# Patient Record
Sex: Male | Born: 1940 | Race: White | Hispanic: No | Marital: Married | Smoking: Former smoker
Health system: Southern US, Community
[De-identification: ages and names within clinical notes are randomized; demographics above are authoritative.]

## PROBLEM LIST (undated history)

## (undated) DIAGNOSIS — M545 Low back pain, unspecified: Secondary | ICD-10-CM

## (undated) DIAGNOSIS — I219 Acute myocardial infarction, unspecified: Secondary | ICD-10-CM

## (undated) DIAGNOSIS — M1990 Unspecified osteoarthritis, unspecified site: Secondary | ICD-10-CM

## (undated) DIAGNOSIS — H409 Unspecified glaucoma: Secondary | ICD-10-CM

## (undated) DIAGNOSIS — E785 Hyperlipidemia, unspecified: Secondary | ICD-10-CM

## (undated) DIAGNOSIS — G4730 Sleep apnea, unspecified: Secondary | ICD-10-CM

## (undated) DIAGNOSIS — I10 Essential (primary) hypertension: Secondary | ICD-10-CM

## (undated) DIAGNOSIS — H9319 Tinnitus, unspecified ear: Secondary | ICD-10-CM

## (undated) DIAGNOSIS — E119 Type 2 diabetes mellitus without complications: Secondary | ICD-10-CM

## (undated) HISTORY — DX: Acute myocardial infarction, unspecified: I21.9

## (undated) HISTORY — DX: Type 2 diabetes mellitus without complications: E11.9

## (undated) HISTORY — DX: Hyperlipidemia, unspecified: E78.5

## (undated) HISTORY — DX: Sleep apnea, unspecified: G47.30

## (undated) HISTORY — PX: CORONARY ANGIOPLASTY WITH STENT PLACEMENT: SHX49

## (undated) HISTORY — DX: Unspecified osteoarthritis, unspecified site: M19.90

## (undated) HISTORY — DX: Essential (primary) hypertension: I10

## (undated) HISTORY — DX: Unspecified glaucoma: H40.9

---

## 2004-04-12 ENCOUNTER — Emergency Department: Payer: Self-pay | Admitting: Internal Medicine

## 2004-07-26 ENCOUNTER — Other Ambulatory Visit: Payer: Self-pay

## 2004-08-01 ENCOUNTER — Ambulatory Visit: Payer: Self-pay | Admitting: General Practice

## 2008-01-23 ENCOUNTER — Ambulatory Visit: Payer: Self-pay | Admitting: Internal Medicine

## 2011-04-19 ENCOUNTER — Encounter: Payer: Self-pay | Admitting: Family Medicine

## 2011-04-20 ENCOUNTER — Encounter: Payer: Self-pay | Admitting: Family Medicine

## 2011-05-21 ENCOUNTER — Encounter: Payer: Self-pay | Admitting: Family Medicine

## 2011-11-01 ENCOUNTER — Ambulatory Visit: Payer: Self-pay | Admitting: Family Medicine

## 2013-11-03 DIAGNOSIS — I2510 Atherosclerotic heart disease of native coronary artery without angina pectoris: Secondary | ICD-10-CM | POA: Insufficient documentation

## 2013-11-03 DIAGNOSIS — I10 Essential (primary) hypertension: Secondary | ICD-10-CM | POA: Insufficient documentation

## 2013-11-03 DIAGNOSIS — E782 Mixed hyperlipidemia: Secondary | ICD-10-CM | POA: Insufficient documentation

## 2014-05-11 DIAGNOSIS — I340 Nonrheumatic mitral (valve) insufficiency: Secondary | ICD-10-CM | POA: Insufficient documentation

## 2014-05-11 DIAGNOSIS — I6523 Occlusion and stenosis of bilateral carotid arteries: Secondary | ICD-10-CM | POA: Insufficient documentation

## 2014-09-21 ENCOUNTER — Encounter: Payer: Self-pay | Admitting: Urology

## 2014-09-21 ENCOUNTER — Ambulatory Visit (INDEPENDENT_AMBULATORY_CARE_PROVIDER_SITE_OTHER): Payer: Medicare Other | Admitting: Urology

## 2014-09-21 ENCOUNTER — Telehealth: Payer: Self-pay | Admitting: Urology

## 2014-09-21 VITALS — BP 122/64 | HR 82 | Ht 71.0 in | Wt 193.7 lb

## 2014-09-21 DIAGNOSIS — N3001 Acute cystitis with hematuria: Secondary | ICD-10-CM

## 2014-09-21 DIAGNOSIS — N401 Enlarged prostate with lower urinary tract symptoms: Secondary | ICD-10-CM

## 2014-09-21 DIAGNOSIS — N138 Other obstructive and reflux uropathy: Secondary | ICD-10-CM

## 2014-09-21 LAB — URINALYSIS, COMPLETE
BILIRUBIN UA: NEGATIVE
GLUCOSE, UA: NEGATIVE
Ketones, UA: NEGATIVE
LEUKOCYTES UA: NEGATIVE
Nitrite, UA: NEGATIVE
SPEC GRAV UA: 1.025 (ref 1.005–1.030)
Urobilinogen, Ur: 0.2 mg/dL (ref 0.2–1.0)
pH, UA: 6 (ref 5.0–7.5)

## 2014-09-21 LAB — MICROSCOPIC EXAMINATION: Bacteria, UA: NONE SEEN

## 2014-09-21 LAB — BLADDER SCAN AMB NON-IMAGING

## 2014-09-21 NOTE — Telephone Encounter (Signed)
This is a patient of Dr. Darreld Mclean.  Would you call Adrian Kim and get his recent PSA results and microscopic hematuria?

## 2014-09-21 NOTE — Progress Notes (Signed)
09/21/2014 2:10 PM   Adrian Kim 08/24/1940 [id number]  Referring provider: No referring provider defined for this encounter.  Chief Complaint  Patient presents with  . Cystitis    Acute, with Hematuria  Referred by Phineas Real    HPI: Adrian Kim is a 74 year old [place] man who is referred to [place] for acute cystitis with hematuria.  He presents with his son who is interpreting for him.  He is a poor historian.  The history from the son is he is getting up 4 times a night since his infection.  He is also experiencing day time frequency.  He has not had gross hematuria.  He was given a medication that helps his symptoms somewhat, but the son is unsure of the name of the medication.  He is not having dysuria, suprapubic pain, flank pain or feelings of incomplete emptying.    He denies fevers, chills, nausea or vomiting.    I do have the notes from his PCP's office, but they are incomplete.  He did have a +dip for micro heme and the urine was sent for microanalysis.  They also drew a PSA.     PMH: Past Medical History  Diagnosis Date  . Arthritis   . Diabetes   . Glaucoma   . Heart attack   . HLD (hyperlipidemia)   . HTN (hypertension)   . Sleep apnea     Surgical History: Past Surgical History  Procedure Laterality Date  . Coronary angioplasty with stent placement      Home Medications:    Medication List       This list is accurate as of: 09/21/14  2:10 PM.  Always use your most recent med list.               aspirin EC 81 MG tablet  Take by mouth.     benzonatate 100 MG capsule  Commonly known as:  TESSALON  Take by mouth.     BETIMOL OP  Apply to eye.     ciprofloxacin 500 MG tablet  Commonly known as:  CIPRO     clopidogrel 75 MG tablet  Commonly known as:  PLAVIX  Take by mouth.     CRESTOR 40 MG tablet  Generic drug:  rosuvastatin     fluticasone 50 MCG/ACT nasal spray  Commonly known as:  FLONASE     LANTUS 100 UNIT/ML injection    Generic drug:  insulin glargine  Inject into the skin.     latanoprost 0.005 % ophthalmic solution  Commonly known as:  XALATAN     linagliptin 5 MG Tabs tablet  Commonly known as:  TRADJENTA  Take 5 mg by mouth daily.     lisinopril 2.5 MG tablet  Commonly known as:  PRINIVIL,ZESTRIL     meloxicam 15 MG tablet  Commonly known as:  MOBIC  Take by mouth.     metFORMIN 500 MG 24 hr tablet  Commonly known as:  GLUCOPHAGE-XR     pioglitazone 15 MG tablet  Commonly known as:  ACTOS  Take by mouth.     timolol 0.5 % ophthalmic solution  Commonly known as:  TIMOPTIC     VOLTAREN 1 % Gel  Generic drug:  diclofenac sodium     zolpidem 10 MG tablet  Commonly known as:  AMBIEN        Allergies: No Known Allergies  Family History: Family History  Problem Relation Age of Onset  . Kidney  disease Neg Hx   . Prostate cancer Neg Hx   . Leukemia Son     Social History:  reports that he has quit smoking. He does not have any smokeless tobacco history on file. He reports that he does not drink alcohol or use illicit drugs.  ROS: UROLOGY Frequent Urination?: No Hard to postpone urination?: No Burning/pain with urination?: No Get up at night to urinate?: Yes Leakage of urine?: No Urine stream starts and stops?: No Trouble starting stream?: No Do you have to strain to urinate?: No Blood in urine?: No Urinary tract infection?: No Sexually transmitted disease?: No Injury to kidneys or bladder?: No Painful intercourse?: No Weak stream?: No Erection problems?: No Penile pain?: No  Gastrointestinal Nausea?: No Vomiting?: No Indigestion/heartburn?: No Diarrhea?: No Constipation?: No  Constitutional Fever: No Night sweats?: No Weight loss?: No Fatigue?: No  Skin Skin rash/lesions?: No Itching?: No  Eyes Blurred vision?: No Double vision?: No  Ears/Nose/Throat Sore throat?: No Sinus problems?: No  Hematologic/Lymphatic Swollen glands?: No Easy  bruising?: No  Cardiovascular Leg swelling?: No Chest pain?: No  Respiratory Cough?: No Shortness of breath?: No  Endocrine Excessive thirst?: No  Musculoskeletal Back pain?: Yes Joint pain?: Yes  Neurological Headaches?: No Dizziness?: No  Psychologic Depression?: No Anxiety?: No  Physical Exam: BP 122/64 mmHg  Pulse 82  Ht 5\' 11"  (1.803 m)  Wt 193 lb 11.2 oz (87.862 kg)  BMI 27.03 kg/m2  Constitutional:  Alert and oriented, No acute distress. HEENT: [place] AT, moist mucus membranes.  Trachea midline, no masses. Cardiovascular: No clubbing, cyanosis, or edema. Respiratory: Normal respiratory effort, no increased work of breathing. GI: Abdomen is soft, nontender, nondistended, no abdominal masses GU: No CVA tenderness. GU: Patient with uncircumcised phallus. Foreskin easily retracted  Urethral meatus is patent.  No penile discharge. No penile lesions or rashes. Scrotum without lesions, cysts, rashes and/or edema.  Testicles are located scrotally bilaterally. No masses are appreciated in the testicles. Left and right epididymis are normal. Rectal: Patient with  normal sphincter tone. Perineum without scarring or rashes. No rectal masses are appreciated. Prostate is approximately 45 grams, no nodules are appreciated. Seminal vesicles are normal. Skin: No rashes, bruises or suspicious lesions. Lymph: No cervical or inguinal adenopathy. Neurologic: Grossly intact, no focal deficits, moving all 4 extremities. Psychiatric: Normal mood and affect.  Laboratory Data: Results for orders placed or performed in visit on 09/21/14  Microscopic Examination  Result Value Ref Range   WBC, UA 0-5 0 -  5 /hpf   RBC, UA 3-10 (A) 0 -  2 /hpf   Epithelial Cells (non renal) 0-10 0 - 10 /hpf   Mucus, UA Present (A) Not Estab.   Bacteria, UA None seen None seen/Few  Urinalysis, Complete  Result Value Ref Range   Specific Gravity, UA 1.025 1.005 - 1.030   pH, UA 6.0 5.0 - 7.5   Color, UA  Yellow Yellow   Appearance Ur Clear Clear   Leukocytes, UA Negative Negative   Protein, UA Trace (A) Negative/Trace   Glucose, UA Negative Negative   Ketones, UA Negative Negative   RBC, UA 1+ (A) Negative   Bilirubin, UA Negative Negative   Urobilinogen, Ur 0.2 0.2 - 1.0 mg/dL   Nitrite, UA Negative Negative   Microscopic Examination See below:   BLADDER SCAN AMB NON-IMAGING  Result Value Ref Range   Scan Result 73ml    No results found for: WBC, HGB, HCT, MCV, PLT  No results found  for: CREATININE  No results found for: PSA  No results found for: TESTOSTERONE  No results found for: HGBA1C  Urinalysis No results found for: COLORURINE, APPEARANCEUR, LABSPEC, PHURINE, GLUCOSEU, HGBUR, BILIRUBINUR, KETONESUR, PROTEINUR, UROBILINOGEN, NITRITE, LEUKOCYTESUR  Pertinent Imaging:   Assessment & Plan:    1. Acute cystitis with hematuria:   Patient has microscopic hematuria on today's urinalysis. I will send this urine for culture. I do not have the microscopic urinalysis from his primary care provider at this time. The primary care provider's note states he had a positive dip for hematuria and that the urine was sent for microanalysis.  If patient's urine culture returns negative and a microscopic analysis of the urine was performed at the primary care's office and it did demonstrate 3 or greater RBCs per high-power field, we will pursue a hematuria workup.  If urine culture returns positive, we will treat with the appropriate antibiotic and recheck the urine 3-5 days after he completes the antibiotic.  If urine culture returns negative and the primary care's office did not complete a microscopic analysis of the urine or bear microscopic analysis of the urine did not demonstrate micro-heme, I will ask the patient to return for a follow-up UA to monitor for microscopic hematuria.  - Urinalysis, Complete  2.  BPH with LUTS:   It was difficult to ascertain during the patient's history  whether he has been having lower urinary tract symptoms for a long period of time or just recently with the urinary tract infection. The primary care provider did obtain a PSA. I will request those results. We will continue to monitor patient's lower urinary tract symptoms after we have confirmed the urinary tract infection has cleared.   No Follow-up on file.  Michiel Cowboy, PA-C  [facility] [address], [address] [place], [place] [address] [phone]

## 2014-09-22 DIAGNOSIS — N401 Enlarged prostate with lower urinary tract symptoms: Secondary | ICD-10-CM

## 2014-09-22 DIAGNOSIS — N3001 Acute cystitis with hematuria: Secondary | ICD-10-CM | POA: Insufficient documentation

## 2014-09-22 DIAGNOSIS — N138 Other obstructive and reflux uropathy: Secondary | ICD-10-CM | POA: Insufficient documentation

## 2014-09-22 NOTE — Telephone Encounter (Signed)
Spoke with [facility] who will be faxing over records.

## 2014-09-23 LAB — CULTURE, URINE COMPREHENSIVE

## 2014-11-17 DIAGNOSIS — I071 Rheumatic tricuspid insufficiency: Secondary | ICD-10-CM | POA: Insufficient documentation

## 2014-12-09 ENCOUNTER — Encounter: Payer: Self-pay | Admitting: *Deleted

## 2014-12-10 ENCOUNTER — Encounter: Payer: Self-pay | Admitting: *Deleted

## 2014-12-10 ENCOUNTER — Ambulatory Visit: Payer: Medicare Other | Admitting: Anesthesiology

## 2014-12-10 ENCOUNTER — Ambulatory Visit
Admission: RE | Admit: 2014-12-10 | Discharge: 2014-12-10 | Disposition: A | Discharge status: Home / Self Care | Payer: Medicare Other | Source: Ambulatory Visit | Attending: Gastroenterology | Admitting: Gastroenterology

## 2014-12-10 ENCOUNTER — Encounter
Admission: RE | Disposition: A | Discharge status: Home / Self Care | Payer: Self-pay | Source: Ambulatory Visit | Attending: Gastroenterology

## 2014-12-10 DIAGNOSIS — H409 Unspecified glaucoma: Secondary | ICD-10-CM | POA: Diagnosis not present

## 2014-12-10 DIAGNOSIS — E785 Hyperlipidemia, unspecified: Secondary | ICD-10-CM | POA: Diagnosis not present

## 2014-12-10 DIAGNOSIS — I10 Essential (primary) hypertension: Secondary | ICD-10-CM | POA: Insufficient documentation

## 2014-12-10 DIAGNOSIS — I252 Old myocardial infarction: Secondary | ICD-10-CM | POA: Insufficient documentation

## 2014-12-10 DIAGNOSIS — Z806 Family history of leukemia: Secondary | ICD-10-CM | POA: Diagnosis not present

## 2014-12-10 DIAGNOSIS — Z7982 Long term (current) use of aspirin: Secondary | ICD-10-CM | POA: Insufficient documentation

## 2014-12-10 DIAGNOSIS — G4730 Sleep apnea, unspecified: Secondary | ICD-10-CM | POA: Insufficient documentation

## 2014-12-10 DIAGNOSIS — Z7984 Long term (current) use of oral hypoglycemic drugs: Secondary | ICD-10-CM | POA: Diagnosis not present

## 2014-12-10 DIAGNOSIS — M1990 Unspecified osteoarthritis, unspecified site: Secondary | ICD-10-CM | POA: Insufficient documentation

## 2014-12-10 DIAGNOSIS — Z79899 Other long term (current) drug therapy: Secondary | ICD-10-CM | POA: Diagnosis not present

## 2014-12-10 DIAGNOSIS — Z87891 Personal history of nicotine dependence: Secondary | ICD-10-CM | POA: Insufficient documentation

## 2014-12-10 DIAGNOSIS — I2510 Atherosclerotic heart disease of native coronary artery without angina pectoris: Secondary | ICD-10-CM | POA: Diagnosis not present

## 2014-12-10 DIAGNOSIS — Z1211 Encounter for screening for malignant neoplasm of colon: Secondary | ICD-10-CM | POA: Insufficient documentation

## 2014-12-10 DIAGNOSIS — E119 Type 2 diabetes mellitus without complications: Secondary | ICD-10-CM | POA: Diagnosis not present

## 2014-12-10 DIAGNOSIS — Z794 Long term (current) use of insulin: Secondary | ICD-10-CM | POA: Diagnosis not present

## 2014-12-10 DIAGNOSIS — Z955 Presence of coronary angioplasty implant and graft: Secondary | ICD-10-CM | POA: Diagnosis not present

## 2014-12-10 HISTORY — PX: COLONOSCOPY WITH PROPOFOL: SHX5780

## 2014-12-10 LAB — GLUCOSE, CAPILLARY: GLUCOSE-CAPILLARY: 144 mg/dL — AB (ref 65–99)

## 2014-12-10 SURGERY — COLONOSCOPY WITH PROPOFOL
Anesthesia: General

## 2014-12-10 MED ORDER — SODIUM CHLORIDE 0.9 % IV SOLN
INTRAVENOUS | Status: DC
  Administered 2014-12-10: 1000 mL via INTRAVENOUS

## 2014-12-10 MED ORDER — PROPOFOL 500 MG/50ML IV EMUL
INTRAVENOUS | Status: DC | PRN
  Administered 2014-12-10: 120 ug/kg/min via INTRAVENOUS

## 2014-12-10 MED ORDER — SODIUM CHLORIDE 0.9 % IV SOLN: INTRAVENOUS | Status: DC

## 2014-12-10 MED ORDER — PROPOFOL 10 MG/ML IV BOLUS
INTRAVENOUS | Status: DC | PRN
  Administered 2014-12-10: 70 mg via INTRAVENOUS
  Administered 2014-12-10: 30 mg via INTRAVENOUS

## 2014-12-10 MED ORDER — LIDOCAINE HCL (CARDIAC) 20 MG/ML IV SOLN
INTRAVENOUS | Status: DC | PRN
  Administered 2014-12-10: 50 mg via INTRAVENOUS

## 2014-12-10 NOTE — H&P (Signed)
Primary Care Physician:  Leanna Sato, MD Primary Gastroenterologist:  Dr. Bluford Kaufmann  Pre-Procedure History & Physical: HPI:  Adrian Kim is a 74 y.o. male is here for an colonoscopy.  Past Medical History  Diagnosis Date  . Arthritis   . Diabetes (HCC)   . Glaucoma   . Heart attack (HCC)   . HLD (hyperlipidemia)   . HTN (hypertension)   . Sleep apnea     Past Surgical History  Procedure Laterality Date  . Coronary angioplasty with stent placement      Prior to Admission medications   Medication Sig Start Date End Date Taking? Authorizing Provider  aspirin EC 81 MG tablet Take by mouth.    Historical Provider, MD  benzonatate (TESSALON) 100 MG capsule Take by mouth.    Historical Provider, MD  ciprofloxacin (CIPRO) 500 MG tablet  08/31/14   Historical Provider, MD  clopidogrel (PLAVIX) 75 MG tablet Take by mouth.    Historical Provider, MD  CRESTOR 40 MG tablet  08/02/14   Historical Provider, MD  fluticasone Aleda Grana) 50 MCG/ACT nasal spray  09/20/14   Historical Provider, MD  insulin glargine (LANTUS) 100 UNIT/ML injection Inject into the skin.    Historical Provider, MD  latanoprost (XALATAN) 0.005 % ophthalmic solution  09/03/14   Historical Provider, MD  linagliptin (TRADJENTA) 5 MG TABS tablet Take 5 mg by mouth daily.    Historical Provider, MD  lisinopril (PRINIVIL,ZESTRIL) 2.5 MG tablet  06/21/14   Historical Provider, MD  meloxicam (MOBIC) 15 MG tablet Take by mouth.    Historical Provider, MD  metFORMIN (GLUCOPHAGE-XR) 500 MG 24 hr tablet  09/08/14   Historical Provider, MD  pioglitazone (ACTOS) 15 MG tablet Take by mouth.    Historical Provider, MD  timolol (TIMOPTIC) 0.5 % ophthalmic solution  09/06/14   Historical Provider, MD  Timolol Hemihydrate (BETIMOL OP) Apply to eye.    Historical Provider, MD  VOLTAREN 1 % GEL  09/14/14   Historical Provider, MD  zolpidem (AMBIEN) 10 MG tablet  06/22/14   Historical Provider, MD    Allergies as of 10/27/2014  . (No Known  Allergies)    Family History  Problem Relation Age of Onset  . Kidney disease Neg Hx   . Prostate cancer Neg Hx   . Leukemia Son     Social History   Social History  . Marital Status: Married    Spouse Name: N/A  . Number of Children: N/A  . Years of Education: N/A   Occupational History  . Not on file.   Social History Main Topics  . Smoking status: Former Games developer  . Smokeless tobacco: Never Used     Comment: Quit 11 years ago  . Alcohol Use: No  . Drug Use: No  . Sexual Activity: Not on file   Other Topics Concern  . Not on file   Social History Narrative    Review of Systems: See HPI, otherwise negative ROS  Physical Exam: There were no vitals taken for this visit. General:   Alert,  pleasant and cooperative in NAD Head:  Normocephalic and atraumatic. Neck:  Supple; no masses or thyromegaly. Lungs:  Clear throughout to auscultation.    Heart:  Regular rate and rhythm. Abdomen:  Soft, nontender and nondistended. Normal bowel sounds, without guarding, and without rebound.   Neurologic:  Alert and  oriented x4;  grossly normal neurologically.  Impression/Plan: Adrian Kim is here for an colonoscopy to be performed for screening  Risks, benefits, limitations, and alternatives regarding  colonoscopy have been reviewed with the patient.  Questions have been answered.  All parties agreeable.   Zavia Pullen, Ezzard Standing, MD  12/10/2014, 7:55 AM

## 2014-12-10 NOTE — Anesthesia Preprocedure Evaluation (Signed)
Anesthesia Evaluation  Patient identified by MRN, date of birth, ID band Patient awake    Reviewed: Allergy & Precautions, H&P , NPO status , Patient's Chart, lab work & pertinent test results, reviewed documented beta blocker date and time   History of Anesthesia Complications Negative for: history of anesthetic complications  Airway Mallampati: III  TM Distance: >3 FB Neck ROM: full    Dental no notable dental hx. (+) Upper Dentures, Lower Dentures   Pulmonary neg shortness of breath, sleep apnea , neg COPD, neg recent URI, former smoker,    Pulmonary exam normal breath sounds clear to auscultation       Cardiovascular Exercise Tolerance: Good hypertension, On Medications (-) angina+ CAD, + Past MI and + Cardiac Stents  (-) CABG Normal cardiovascular exam(-) dysrhythmias  Rhythm:regular Rate:Normal     Neuro/Psych negative neurological ROS  negative psych ROS   GI/Hepatic negative GI ROS, Neg liver ROS,   Endo/Other  diabetes  Renal/GU negative Renal ROS  negative genitourinary   Musculoskeletal   Abdominal   Peds  Hematology negative hematology ROS (+)   Anesthesia Other Findings Past Medical History:   Arthritis                                                    Diabetes (HCC)                                               Glaucoma                                                     Heart attack (HCC)                                           HLD (hyperlipidemia)                                         HTN (hypertension)                                           Sleep apnea                                                  Reproductive/Obstetrics negative OB ROS                             Anesthesia Physical Anesthesia Plan  ASA: III  Anesthesia Plan: General   Post-op Pain Management:    Induction:   Airway Management Planned:   Additional Equipment:   Intra-op Plan:  Post-operative Plan:   Informed Consent: I have reviewed the patients History and Physical, chart, labs and discussed the procedure including the risks, benefits and alternatives for the proposed anesthesia with the patient or authorized representative who has indicated his/her understanding and acceptance.   Dental Advisory Given  Plan Discussed with: Anesthesiologist, CRNA and Surgeon  Anesthesia Plan Comments:         Anesthesia Quick Evaluation

## 2014-12-10 NOTE — Transfer of Care (Signed)
Immediate Anesthesia Transfer of Care Note  Patient: Adrian Kim  Procedure(s) Performed: Procedure(s): COLONOSCOPY WITH PROPOFOL (N/A)  Patient Location: Endoscopy Unit  Anesthesia Type:General  Level of Consciousness: awake  Airway & Oxygen Therapy: Patient Spontanous Breathing and Patient connected to nasal cannula oxygen  Post-op Assessment: Report given to RN  Post vital signs: Reviewed  Last Vitals:  Filed Vitals:   12/10/14 0942  BP: 97/61  Pulse: 75  Temp: 36.1 C  Resp: 14    Complications: No apparent anesthesia complications

## 2014-12-10 NOTE — Op Note (Signed)
[facility] Patient Name: Adrian Kim Procedure Date: 12/10/2014 9:06 AM MRN: [id number] Account #: [id number] Date of Birth: 05-Aug-1940 Admit Type: Outpatient Age: 74 Room: [facility] ENDO ROOM 4 Gender: Male Note Status: Finalized Procedure:         Colonoscopy Indications:       Screening for colorectal malignant neoplasm Providers:         Ezzard Standing. Bluford Kaufmann, MD Referring MD:      Leanna Sato, MD (Referring MD) Medicines:         Monitored Anesthesia Care Complications:     No immediate complications. Procedure:         Pre-Anesthesia Assessment:                    - Prior to the procedure, a History and Physical was                     performed, and patient medications, allergies and                     sensitivities were reviewed. The patient's tolerance of                     previous anesthesia was reviewed.                    - The risks and benefits of the procedure and the sedation                     options and risks were discussed with the patient. All                     questions were answered and informed consent was obtained.                    - After reviewing the risks and benefits, the patient was                     deemed in satisfactory condition to undergo the procedure.                    After obtaining informed consent, the colonoscope was                     passed under direct vision. Throughout the procedure, the                     patient's blood pressure, pulse, and oxygen saturations                     were monitored continuously. The Colonoscope was                     introduced through the anus and advanced to the the cecum,                     identified by appendiceal orifice and ileocecal valve. The                     colonoscopy was performed without difficulty. The patient                     tolerated the procedure well. The quality of the bowel  preparation was poor. Findings:  The colon (entire examined portion) appeared normal. Prep was quite       poor. Lot of time spent lavaging and suctioning thick liquid stool. As a       result, there were areas that could not be visualized well. Impression:        - Preparation of the colon was poor.                    - The entire examined colon is normal.                    - No specimens collected. Recommendation:    - Discharge patient to home.                    - The findings and recommendations were discussed with the                     patient. Procedure Code(s): --- Professional ---                    [phone], Colonoscopy, flexible; diagnostic, including                     collection of specimen(s) by brushing or washing, when                     performed (separate procedure) Diagnosis Code(s): --- Professional ---                    Z12.11, Encounter for screening for malignant neoplasm of                     colon CPT copyright 2014 American Medical Association. All rights reserved. The codes documented in this report are preliminary and upon coder review may  be revised to meet current compliance requirements. Wallace Cullens, MD 12/10/2014 9:40:13 AM This report has been signed electronically. Number of Addenda: 0 Note Initiated On: 12/10/2014 9:06 AM      [facility]

## 2014-12-13 NOTE — Anesthesia Postprocedure Evaluation (Signed)
  Anesthesia Post-op Note  Patient: Adrian Kim  Procedure(s) Performed: Procedure(s): COLONOSCOPY WITH PROPOFOL (N/A)  Anesthesia type:General  Patient location: PACU  Post pain: Pain level controlled  Post assessment: Post-op Vital signs reviewed, Patient's Cardiovascular Status Stable, Respiratory Function Stable, Patent Airway and No signs of Nausea or vomiting  Post vital signs: Reviewed and stable  Last Vitals:  Filed Vitals:   12/10/14 0942  BP: 97/61  Pulse: 75  Temp: 36.1 C  Resp: 14    Level of consciousness: awake, alert  and patient cooperative  Complications: No apparent anesthesia complications

## 2014-12-16 ENCOUNTER — Encounter: Payer: Self-pay | Admitting: Gastroenterology

## 2015-11-09 ENCOUNTER — Encounter: Payer: Self-pay | Admitting: *Deleted

## 2015-11-14 NOTE — Discharge Instructions (Signed)

## 2015-11-16 ENCOUNTER — Ambulatory Visit
Admission: RE | Admit: 2015-11-16 | Discharge: 2015-11-16 | Disposition: A | Discharge status: Home / Self Care | Payer: Medicare Other | Source: Ambulatory Visit | Attending: Ophthalmology | Admitting: Ophthalmology

## 2015-11-16 ENCOUNTER — Ambulatory Visit: Payer: Medicare Other | Admitting: Anesthesiology

## 2015-11-16 ENCOUNTER — Encounter
Admission: RE | Disposition: A | Discharge status: Home / Self Care | Payer: Self-pay | Source: Ambulatory Visit | Attending: Ophthalmology

## 2015-11-16 DIAGNOSIS — I252 Old myocardial infarction: Secondary | ICD-10-CM | POA: Diagnosis not present

## 2015-11-16 DIAGNOSIS — Z87891 Personal history of nicotine dependence: Secondary | ICD-10-CM | POA: Diagnosis not present

## 2015-11-16 DIAGNOSIS — M1990 Unspecified osteoarthritis, unspecified site: Secondary | ICD-10-CM | POA: Insufficient documentation

## 2015-11-16 DIAGNOSIS — I2510 Atherosclerotic heart disease of native coronary artery without angina pectoris: Secondary | ICD-10-CM | POA: Diagnosis not present

## 2015-11-16 DIAGNOSIS — H2511 Age-related nuclear cataract, right eye: Secondary | ICD-10-CM | POA: Insufficient documentation

## 2015-11-16 DIAGNOSIS — G4730 Sleep apnea, unspecified: Secondary | ICD-10-CM | POA: Insufficient documentation

## 2015-11-16 DIAGNOSIS — E7800 Pure hypercholesterolemia, unspecified: Secondary | ICD-10-CM | POA: Insufficient documentation

## 2015-11-16 DIAGNOSIS — Z9861 Coronary angioplasty status: Secondary | ICD-10-CM | POA: Insufficient documentation

## 2015-11-16 DIAGNOSIS — I10 Essential (primary) hypertension: Secondary | ICD-10-CM | POA: Insufficient documentation

## 2015-11-16 DIAGNOSIS — E119 Type 2 diabetes mellitus without complications: Secondary | ICD-10-CM | POA: Insufficient documentation

## 2015-11-16 HISTORY — DX: Low back pain: M54.5

## 2015-11-16 HISTORY — PX: CATARACT EXTRACTION W/PHACO: SHX586

## 2015-11-16 HISTORY — DX: Low back pain, unspecified: M54.50

## 2015-11-16 HISTORY — DX: Tinnitus, unspecified ear: H93.19

## 2015-11-16 LAB — GLUCOSE, CAPILLARY
Glucose-Capillary: 108 mg/dL — ABNORMAL HIGH (ref 65–99)
Glucose-Capillary: 136 mg/dL — ABNORMAL HIGH (ref 65–99)

## 2015-11-16 SURGERY — PHACOEMULSIFICATION, CATARACT, WITH IOL INSERTION
Anesthesia: Monitor Anesthesia Care | Laterality: Right | Wound class: Clean

## 2015-11-16 MED ORDER — BSS IO SOLN
INTRAOCULAR | Status: DC | PRN
  Administered 2015-11-16: 52 mL via OPHTHALMIC

## 2015-11-16 MED ORDER — FENTANYL CITRATE (PF) 100 MCG/2ML IJ SOLN
INTRAMUSCULAR | Status: DC | PRN
  Administered 2015-11-16: 100 ug via INTRAVENOUS

## 2015-11-16 MED ORDER — MOXIFLOXACIN HCL 0.5 % OP SOLN
1.0000 [drp] | OPHTHALMIC | Status: DC | PRN
  Administered 2015-11-16 (×3): 1 [drp] via OPHTHALMIC

## 2015-11-16 MED ORDER — TIMOLOL MALEATE 0.5 % OP SOLN
OPHTHALMIC | Status: DC | PRN
  Administered 2015-11-16: 1 [drp] via OPHTHALMIC

## 2015-11-16 MED ORDER — BALANCED SALT IO SOLN
INTRAOCULAR | Status: DC | PRN
  Administered 2015-11-16: 1 mL via OPHTHALMIC

## 2015-11-16 MED ORDER — OXYCODONE HCL 5 MG PO TABS: 5.0000 mg | ORAL_TABLET | Freq: Once | ORAL | Status: DC | PRN

## 2015-11-16 MED ORDER — OXYCODONE HCL 5 MG/5ML PO SOLN: 5.0000 mg | Freq: Once | ORAL | Status: DC | PRN

## 2015-11-16 MED ORDER — LACTATED RINGERS IV SOLN: INTRAVENOUS | Status: DC

## 2015-11-16 MED ORDER — NA HYALUR & NA CHOND-NA HYALUR 0.4-0.35 ML IO KIT
PACK | INTRAOCULAR | Status: DC | PRN
  Administered 2015-11-16: 1 mL via INTRAOCULAR

## 2015-11-16 MED ORDER — MIDAZOLAM HCL 2 MG/2ML IJ SOLN
INTRAMUSCULAR | Status: DC | PRN
  Administered 2015-11-16: 2 mg via INTRAVENOUS

## 2015-11-16 MED ORDER — CEFUROXIME OPHTHALMIC INJECTION 1 MG/0.1 ML
INJECTION | OPHTHALMIC | Status: DC | PRN
  Administered 2015-11-16: 0.1 mL via OPHTHALMIC

## 2015-11-16 MED ORDER — ARMC OPHTHALMIC DILATING DROPS
1.0000 "application " | OPHTHALMIC | Status: DC | PRN
  Administered 2015-11-16 (×3): 1 via OPHTHALMIC

## 2015-11-16 SURGICAL SUPPLY — 25 items
CANNULA ANT/CHMB 27GA (MISCELLANEOUS) ×3 IMPLANT
CARTRIDGE ABBOTT (MISCELLANEOUS) IMPLANT
GLOVE SURG LX 7.5 STRW (GLOVE) ×2
GLOVE SURG LX STRL 7.5 STRW (GLOVE) ×1 IMPLANT
GLOVE SURG TRIUMPH 8.0 PF LTX (GLOVE) ×3 IMPLANT
GOWN STRL REUS W/ TWL LRG LVL3 (GOWN DISPOSABLE) ×2 IMPLANT
GOWN STRL REUS W/TWL LRG LVL3 (GOWN DISPOSABLE) ×4
LENS IOL TECNIS ITEC 25.5 (Intraocular Lens) ×3 IMPLANT
MARKER SKIN DUAL TIP RULER LAB (MISCELLANEOUS) ×3 IMPLANT
NDL RETROBULBAR .5 NSTRL (NEEDLE) IMPLANT
NEEDLE FILTER BLUNT 18X 1/2SAF (NEEDLE) ×2
NEEDLE FILTER BLUNT 18X1 1/2 (NEEDLE) ×1 IMPLANT
PACK CATARACT BRASINGTON (MISCELLANEOUS) ×3 IMPLANT
PACK EYE AFTER SURG (MISCELLANEOUS) ×3 IMPLANT
PACK OPTHALMIC (MISCELLANEOUS) ×3 IMPLANT
RING MALYGIN 7.0 (MISCELLANEOUS) IMPLANT
SUT ETHILON 10-0 CS-B-6CS-B-6 (SUTURE)
SUT VICRYL  9 0 (SUTURE)
SUT VICRYL 9 0 (SUTURE) IMPLANT
SUTURE EHLN 10-0 CS-B-6CS-B-6 (SUTURE) IMPLANT
SYR 3ML LL SCALE MARK (SYRINGE) ×3 IMPLANT
SYR 5ML LL (SYRINGE) ×3 IMPLANT
SYR TB 1ML LUER SLIP (SYRINGE) ×3 IMPLANT
WATER STERILE IRR 250ML POUR (IV SOLUTION) ×3 IMPLANT
WIPE NON LINTING 3.25X3.25 (MISCELLANEOUS) ×3 IMPLANT

## 2015-11-16 NOTE — Op Note (Signed)
LOCATION:  [facility]   PREOPERATIVE DIAGNOSIS:    Nuclear sclerotic cataract right eye. H25.11   POSTOPERATIVE DIAGNOSIS:  Nuclear sclerotic cataract right eye.     PROCEDURE:  Phacoemusification with posterior chamber intraocular lens placement of the right eye   LENS:   Implant Name Type Inv. Item Serial No. Manufacturer Lot No. LRB No. Used  LENS IOL DIOP 25.5 - [id number] Intraocular Lens LENS IOL DIOP 25.5 [id number]   Right 1        ULTRASOUND TIME: 16 % of 1 minutes, 0 seconds.  CDE 9.4   SURGEON:  Deirdre Evener, MD   ANESTHESIA:  Topical with tetracaine drops and 2% Xylocaine jelly, augmented with 1% preservative-free intracameral lidocaine.    COMPLICATIONS:  None.   DESCRIPTION OF PROCEDURE:  The patient was identified in the holding room and transported to the operating room and placed in the supine position under the operating microscope.  The right eye was identified as the operative eye and it was prepped and draped in the usual sterile ophthalmic fashion.   A 1 millimeter clear-corneal paracentesis was made at the 12:00 position.  0.5 ml of preservative-free 1% lidocaine was injected into the anterior chamber. The anterior chamber was filled with Viscoat viscoelastic.  A 2.4 millimeter keratome was used to make a near-clear corneal incision at the 9:00 position.  A curvilinear capsulorrhexis was made with a cystotome and capsulorrhexis forceps.  Balanced salt solution was used to hydrodissect and hydrodelineate the nucleus.   Phacoemulsification was then used in stop and chop fashion to remove the lens nucleus and epinucleus.  The remaining cortex was then removed using the irrigation and aspiration handpiece. Provisc was then placed into the capsular bag to distend it for lens placement.  A lens was then injected into the capsular bag.  The remaining viscoelastic was aspirated.   Wounds were hydrated with balanced salt solution.  The anterior  chamber was inflated to a physiologic pressure with balanced salt solution.  No wound leaks were noted. Cefuroxime 0.1 ml of a 10mg /ml solution was injected into the anterior chamber for a dose of 1 mg of intracameral antibiotic at the completion of the case.   Timolol drops were applied to the eye.  The patient was taken to the recovery room in stable condition without complications of anesthesia or surgery.   Munachimso Rigdon 11/16/2015, 9:30 AM \

## 2015-11-16 NOTE — Transfer of Care (Signed)
Immediate Anesthesia Transfer of Care Note  Patient: Adrian Kim  Procedure(s) Performed: Procedure(s) with comments: CATARACT EXTRACTION PHACO AND INTRAOCULAR LENS PLACEMENT (IOC) (Right) - DIABETIC - oral meds NEEDS [place] INTERPRETER  Patient Location: PACU  Anesthesia Type: MAC  Level of Consciousness: awake, alert  and patient cooperative  Airway and Oxygen Therapy: Patient Spontanous Breathing and Patient connected to supplemental oxygen  Post-op Assessment: Post-op Vital signs reviewed, Patient's Cardiovascular Status Stable, Respiratory Function Stable, Patent Airway and No signs of Nausea or vomiting  Post-op Vital Signs: Reviewed and stable  Complications: No apparent anesthesia complications

## 2015-11-16 NOTE — H&P (Signed)
The History and Physical notes are on paper, have been signed, and are to be scanned. The patient remains stable and unchanged from the H&P.   Previous H&P reviewed, patient examined, and there are no changes.  Adrian Kim 11/16/2015 9:01 AM

## 2015-11-16 NOTE — Anesthesia Preprocedure Evaluation (Signed)
Anesthesia Evaluation  Patient identified by MRN, date of birth, ID band Patient awake    Reviewed: Allergy & Precautions, H&P , NPO status , Patient's Chart, lab work & pertinent test results  Airway Mallampati: II  TM Distance: >3 FB Neck ROM: full    Dental no notable dental hx.    Pulmonary sleep apnea , former smoker,    Pulmonary exam normal        Cardiovascular hypertension, + CAD, + Past MI and + Cardiac Stents  negative cardio ROS Normal cardiovascular exam     Neuro/Psych negative neurological ROS     GI/Hepatic negative GI ROS, Neg liver ROS,   Endo/Other  diabetes, Well Controlled, Type 2  Renal/GU negative Renal ROS  negative genitourinary   Musculoskeletal   Abdominal   Peds  Hematology negative hematology ROS (+)   Anesthesia Other Findings   Reproductive/Obstetrics                             Anesthesia Physical Anesthesia Plan  ASA: III  Anesthesia Plan: MAC   Post-op Pain Management:    Induction:   Airway Management Planned:   Additional Equipment:   Intra-op Plan:   Post-operative Plan:   Informed Consent:   Plan Discussed with:   Anesthesia Plan Comments:         Anesthesia Quick Evaluation

## 2015-11-16 NOTE — Anesthesia Procedure Notes (Signed)
Procedure Name: MAC Performed by: Abelardo Seidner Pre-anesthesia Checklist: Patient identified, Emergency Drugs available, Suction available, Timeout performed and Patient being monitored Patient Re-evaluated:Patient Re-evaluated prior to inductionOxygen Delivery Method: Nasal cannula Placement Confirmation: positive ETCO2     

## 2015-11-16 NOTE — Anesthesia Postprocedure Evaluation (Signed)
Anesthesia Post Note  Patient: Building services engineer  Procedure(s) Performed: Procedure(s) (LRB): CATARACT EXTRACTION PHACO AND INTRAOCULAR LENS PLACEMENT (IOC) (Right)  Patient location during evaluation: PACU Anesthesia Type: MAC Level of consciousness: awake and alert Pain management: pain level controlled Vital Signs Assessment: post-procedure vital signs reviewed and stable Respiratory status: spontaneous breathing Cardiovascular status: blood pressure returned to baseline Postop Assessment: no headache Anesthetic complications: no    Verner Chol, III,  Dione Petron D

## 2015-11-17 ENCOUNTER — Encounter: Payer: Self-pay | Admitting: Ophthalmology

## 2018-01-23 DIAGNOSIS — I350 Nonrheumatic aortic (valve) stenosis: Secondary | ICD-10-CM | POA: Insufficient documentation

## 2019-05-16 ENCOUNTER — Ambulatory Visit: Payer: Medicare Other | Attending: Internal Medicine

## 2019-05-16 DIAGNOSIS — Z23 Encounter for immunization: Secondary | ICD-10-CM

## 2019-05-16 NOTE — Progress Notes (Signed)
Covid-19 Vaccination Clinic  Name:  Adrian Kim    MRN: [id number] DOB: [birth year]  05/16/2019  Adrian Kim was observed post Covid-19 immunization for 15 minutes without incident. He was provided with Vaccine Information Sheet and instruction to access the V-Safe system.   Adrian Kim was instructed to call 911 with any severe reactions post vaccine: Marland Kitchen Difficulty breathing  . Swelling of face and throat  . A fast heartbeat  . A bad rash all over body  . Dizziness and weakness   Immunizations Administered    Name Date Dose VIS Date Route   Pfizer COVID-19 Vaccine 05/16/2019  8:43 AM 0.3 mL 01/30/2019 Intramuscular   Manufacturer: [employer], [employer]   Lot: [id number]   NDC: [address]-4

## 2019-06-06 ENCOUNTER — Ambulatory Visit: Payer: Medicare Other | Attending: Internal Medicine

## 2019-06-06 DIAGNOSIS — Z23 Encounter for immunization: Secondary | ICD-10-CM

## 2019-06-06 NOTE — Progress Notes (Signed)
Covid-19 Vaccination Clinic  Name:  Adrian Kim    MRN: [id number] DOB: 1940/10/09  06/06/2019  Adrian Kim was observed post Covid-19 immunization for 15 minutes without incident. He was provided with Vaccine Information Sheet and instruction to access the V-Safe system.   Adrian Kim was instructed to call 911 with any severe reactions post vaccine: Marland Kitchen Difficulty breathing  . Swelling of face and throat  . A fast heartbeat  . A bad rash all over body  . Dizziness and weakness   Immunizations Administered    Name Date Dose VIS Date Route   Pfizer COVID-19 Vaccine 06/06/2019  8:31 AM 0.3 mL 01/30/2019 Intramuscular   Manufacturer: [employer], [employer]   Lot: [id number]   NDC: [address]-1

## 2020-04-11 ENCOUNTER — Encounter: Payer: Self-pay | Admitting: Urology

## 2020-04-11 ENCOUNTER — Other Ambulatory Visit: Payer: Self-pay

## 2020-04-11 ENCOUNTER — Ambulatory Visit (INDEPENDENT_AMBULATORY_CARE_PROVIDER_SITE_OTHER): Payer: Medicare Other | Admitting: Urology

## 2020-04-11 VITALS — BP 121/75 | HR 87 | Ht 66.0 in | Wt 183.0 lb

## 2020-04-11 DIAGNOSIS — N471 Phimosis: Secondary | ICD-10-CM

## 2020-04-11 DIAGNOSIS — R350 Frequency of micturition: Secondary | ICD-10-CM | POA: Diagnosis not present

## 2020-04-11 DIAGNOSIS — N401 Enlarged prostate with lower urinary tract symptoms: Secondary | ICD-10-CM | POA: Diagnosis not present

## 2020-04-11 MED ORDER — BETAMETHASONE VALERATE 0.1 % EX OINT: 1.0000 "application " | TOPICAL_OINTMENT | Freq: Two times a day (BID) | CUTANEOUS | 0 refills | Status: DC

## 2020-04-11 MED ORDER — TAMSULOSIN HCL 0.4 MG PO CAPS: 0.4000 mg | ORAL_CAPSULE | Freq: Every day | ORAL | 1 refills | Status: DC

## 2020-04-11 NOTE — Progress Notes (Signed)
04/11/2020 8:33 AM   Adrian Kim [birth year] [id number]  Referring provider: Leanna Sato, MD [address] [place],  [place] [address]  Chief Complaint  Patient presents with  . Erectile Dysfunction    HPI: Adrian Kim is a 80 y.o. male referred for erectile dysfunction.  A great telephone interpreter was utilized during this visit   Saw Dr. Marvis Moeller 03/21/2020 with complaints of "private parts feels smaller", frequency and intermittent groin discomfort  He states his most bothersome symptom is urinary frequency every 2 hours, decreased stream and feels the head of his penis is entrapped  Intermittent left groin pain worse when lying on his left side  Denies gross hematuria  Denies prior urologic problems  History of ED no did not want this evaluated  Testosterone level was checked which was low normal at 301   PMH: Past Medical History:  Diagnosis Date  . Arthritis   . Diabetes (HCC)   . Glaucoma   . Heart attack (HCC)   . HLD (hyperlipidemia)   . HTN (hypertension)   . Lower back pain   . Sleep apnea   . Tinnitus     Surgical History: Past Surgical History:  Procedure Laterality Date  . CATARACT EXTRACTION W/PHACO Right 11/16/2015   Procedure: CATARACT EXTRACTION PHACO AND INTRAOCULAR LENS PLACEMENT (IOC);  Surgeon: Lockie Mola, MD;  Location: [facility];  Service: Ophthalmology;  Laterality: Right;  DIABETIC - oral meds NEEDS [place] INTERPRETER  . COLONOSCOPY WITH PROPOFOL N/A 12/10/2014   Procedure: COLONOSCOPY WITH PROPOFOL;  Surgeon: Wallace Cullens, MD;  Location[facility];  Service: Gastroenterology;  Laterality: N/A;  . CORONARY ANGIOPLASTY WITH STENT PLACEMENT      Home Medications:  Allergies as of 04/11/2020      Reactions   Zolpidem    Other reaction(s): Other (See Comments) Confusion      Medication List       Accurate as of April 11, 2020  8:33 AM. If you have any questions, ask your nurse or doctor.         aspirin EC 81 MG tablet Take by mouth.   Crestor 40 MG tablet Generic drug: rosuvastatin   fluticasone 50 MCG/ACT nasal spray Commonly known as: FLONASE   latanoprost 0.005 % ophthalmic solution Commonly known as: XALATAN   linagliptin 5 MG Tabs tablet Commonly known as: TRADJENTA Take 5 mg by mouth daily.   lisinopril 2.5 MG tablet Commonly known as: ZESTRIL   meloxicam 15 MG tablet Commonly known as: MOBIC Take by mouth.   metFORMIN 500 MG 24 hr tablet Commonly known as: GLUCOPHAGE-XR   sitaGLIPtin 25 MG tablet Commonly known as: JANUVIA Take 25 mg by mouth daily.   timolol 0.5 % ophthalmic solution Commonly known as: TIMOPTIC       Allergies:  Allergies  Allergen Reactions  . Zolpidem     Other reaction(s): Other (See Comments) Confusion    Family History: Family History  Problem Relation Age of Onset  . Leukemia Son   . Kidney disease Neg Hx   . Prostate cancer Neg Hx     Social History:  reports that he has quit smoking. He has never used smokeless tobacco. He reports that he does not drink alcohol and does not use drugs.   Physical Exam: There were no vitals taken for this visit.  Constitutional:  Alert and oriented, No acute distress. HEENT: [place] AT, moist mucus membranes.  Trachea midline, no masses. Cardiovascular: No clubbing, cyanosis,  or edema. Respiratory: Normal respiratory effort, no increased work of breathing. GI: Abdomen is soft, nontender, nondistended, no abdominal masses GU: Phallus uncircumcised with phimosis; unable to retract.  Testes descended bilateral without masses or tenderness.  Prostate 40 g, smooth without nodules Skin: No rashes, bruises or suspicious lesions.   Assessment & Plan:    1.  Phimosis  Brief trial of topical steroid  PA follow-up ~ 1 month and if no improvement we discussed possibility of dorsal slit or circumcision  2.  Lower urinary tract symptoms  Bothersome urinary frequency  Does have  obstructive symptoms however his phimosis may be contributing  Trial tamsulosin 0.4 mg daily   [name], MD  [facility] [address] [place], [place] [address] [phone]

## 2020-04-19 ENCOUNTER — Emergency Department
Admission: EM | Admit: 2020-04-19 | Discharge: 2020-04-19 | Disposition: A | Discharge status: Home / Self Care | Payer: Medicare Other | Attending: Emergency Medicine | Admitting: Emergency Medicine

## 2020-04-19 ENCOUNTER — Other Ambulatory Visit: Payer: Self-pay

## 2020-04-19 ENCOUNTER — Emergency Department: Payer: Medicare Other

## 2020-04-19 ENCOUNTER — Encounter: Payer: Self-pay | Admitting: Emergency Medicine

## 2020-04-19 DIAGNOSIS — Z7982 Long term (current) use of aspirin: Secondary | ICD-10-CM | POA: Diagnosis not present

## 2020-04-19 DIAGNOSIS — R42 Dizziness and giddiness: Secondary | ICD-10-CM | POA: Insufficient documentation

## 2020-04-19 DIAGNOSIS — Z955 Presence of coronary angioplasty implant and graft: Secondary | ICD-10-CM | POA: Insufficient documentation

## 2020-04-19 DIAGNOSIS — E119 Type 2 diabetes mellitus without complications: Secondary | ICD-10-CM | POA: Diagnosis not present

## 2020-04-19 DIAGNOSIS — Z7984 Long term (current) use of oral hypoglycemic drugs: Secondary | ICD-10-CM | POA: Insufficient documentation

## 2020-04-19 DIAGNOSIS — I10 Essential (primary) hypertension: Secondary | ICD-10-CM | POA: Insufficient documentation

## 2020-04-19 DIAGNOSIS — Y92000 Kitchen of unspecified non-institutional (private) residence as  the place of occurrence of the external cause: Secondary | ICD-10-CM | POA: Diagnosis not present

## 2020-04-19 DIAGNOSIS — W01198A Fall on same level from slipping, tripping and stumbling with subsequent striking against other object, initial encounter: Secondary | ICD-10-CM | POA: Diagnosis not present

## 2020-04-19 DIAGNOSIS — N3090 Cystitis, unspecified without hematuria: Secondary | ICD-10-CM

## 2020-04-19 DIAGNOSIS — M545 Low back pain, unspecified: Secondary | ICD-10-CM | POA: Insufficient documentation

## 2020-04-19 DIAGNOSIS — Z79899 Other long term (current) drug therapy: Secondary | ICD-10-CM | POA: Diagnosis not present

## 2020-04-19 DIAGNOSIS — Z87891 Personal history of nicotine dependence: Secondary | ICD-10-CM | POA: Insufficient documentation

## 2020-04-19 DIAGNOSIS — M791 Myalgia, unspecified site: Secondary | ICD-10-CM

## 2020-04-19 LAB — CBC
HCT: 46.3 % (ref 39.0–52.0)
Hemoglobin: 15.8 g/dL (ref 13.0–17.0)
MCH: 31 pg (ref 26.0–34.0)
MCHC: 34.1 g/dL (ref 30.0–36.0)
MCV: 90.8 fL (ref 80.0–100.0)
Platelets: 171 10*3/uL (ref 150–400)
RBC: 5.1 MIL/uL (ref 4.22–5.81)
RDW: 12.5 % (ref 11.5–15.5)
WBC: 9.8 10*3/uL (ref 4.0–10.5)
nRBC: 0 % (ref 0.0–0.2)

## 2020-04-19 LAB — BASIC METABOLIC PANEL
Anion gap: 11 (ref 5–15)
BUN: 14 mg/dL (ref 8–23)
CO2: 21 mmol/L — ABNORMAL LOW (ref 22–32)
Calcium: 9.6 mg/dL (ref 8.9–10.3)
Chloride: 102 mmol/L (ref 98–111)
Creatinine, Ser: 0.75 mg/dL (ref 0.61–1.24)
GFR, Estimated: >60 mL/min (ref >60)
Glucose, Bld: 298 mg/dL — ABNORMAL HIGH (ref 70–99)
Potassium: 4 mmol/L (ref 3.5–5.1)
Sodium: 134 mmol/L — ABNORMAL LOW (ref 135–145)

## 2020-04-19 LAB — URINALYSIS, COMPLETE (UACMP) WITH MICROSCOPIC
Bacteria, UA: NONE SEEN
Bilirubin Urine: NEGATIVE
Glucose, UA: >=500 mg/dL — AB
Ketones, ur: NEGATIVE mg/dL
Nitrite: NEGATIVE
Protein, ur: NEGATIVE mg/dL
Specific Gravity, Urine: 1.033 — ABNORMAL HIGH (ref 1.005–1.030)
pH: 5 (ref 5.0–8.0)

## 2020-04-19 MED ORDER — CEPHALEXIN 500 MG PO CAPS: 500.0000 mg | ORAL_CAPSULE | Freq: Two times a day (BID) | ORAL | 0 refills | Status: AC

## 2020-04-19 MED ORDER — LIDOCAINE 5 % EX PTCH
1.0000 | MEDICATED_PATCH | CUTANEOUS | Status: DC
  Administered 2020-04-19: 1 via TRANSDERMAL
  Filled 2020-04-19: qty 1

## 2020-04-19 MED ORDER — KETOROLAC TROMETHAMINE 30 MG/ML IJ SOLN
30.0000 mg | Freq: Once | INTRAMUSCULAR | Status: AC
  Administered 2020-04-19: 30 mg via INTRAMUSCULAR
  Filled 2020-04-19: qty 1

## 2020-04-19 MED ORDER — ACETAMINOPHEN 500 MG PO TABS
1000.0000 mg | ORAL_TABLET | Freq: Once | ORAL | Status: AC
  Administered 2020-04-19: 1000 mg via ORAL
  Filled 2020-04-19: qty 2

## 2020-04-19 MED ORDER — IBUPROFEN 400 MG PO TABS: 400.0000 mg | ORAL_TABLET | Freq: Four times a day (QID) | ORAL | 0 refills | Status: AC | PRN

## 2020-04-19 NOTE — ED Triage Notes (Addendum)
Through hospital audio interpreter for [place]:  Patient reports he was sitting on his couch eating an apple and he went to throw away the core, so he walked to the kitchen, bent over to pick something up and as he was attempting to stand back up he fell down and hit his right shoulder and the right side of his back.  Patient states he felt dizzy today prior to the fall. Patient denies any dizziness at this time. Patient's blood sugar was 204 this morning which is high for him.  Patient states he has had a lot of pain, especially with coughing or deep breathing.  Patient states he fell at 2pm today and did not hit his head or lose consciousness.  Patient is in no obvious distress at this time.  Patient is in no obvious distress at this time and is speaking in full sentences at this time.

## 2020-04-19 NOTE — ED Notes (Signed)
Patient instructed on inspirometer use. Demonstrated well.

## 2020-04-19 NOTE — Discharge Instructions (Addendum)
Take Tylenol 1 g every 8 hours and ibuprofen that I prescribed with food.  Taking antibiotics for possible UTI.  Follow-up with the urine culture report with your primary care doctor in 2 days.  Return the ER for worsening shortness of breath or any other concerns.  His sugar was slightly elevated here and he may need to have his medications adjusted therefore is important to follow-up with your primary care doctor

## 2020-04-19 NOTE — ED Provider Notes (Signed)
[facility] Emergency Department Provider Note  ____________________________________________   Event Date/Time   First MD Initiated Contact with Patient 04/19/20 2129     (approximate)  I have reviewed the triage vital signs and the nursing notes.   HISTORY  Chief Complaint Fall  HPI Adrian Kim is a 80 y.o. male with diabetes, hypertension, hyperlipidemia who comes in with fall.  Patient's sugar was elevated today and they report that once elevated he gets a little dizzy.  They stated that he had dropped an apple peel on the ground and went to go pick it up and he attempted to stand back up but lost his balance and fell onto his right shoulder and right side of his back.  He did not have LOC.  He did not hit his head.  No CTL spine tenderness.  This occurred around 2 PM.  At this time he is back to his baseline self except for pain along his right upper thoracic and right lumbar area.  Pain is moderate, constant, worse with trying to move around, better at rest.  Denies having this pain prior to the fall.  Maybe has a little bit of dysuria but is hard to understand exactly when this started. HE has a h/o phimosis and has cream that he puts on penile head.   Patient son was in the room and I offered interpreter but they declined and he would prefer to interpret for father  At baseline patient has difficulties with ambulation requiring cane as well as difficulties with vision.  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 Arthritis   . Diabetes (HCC)   . Glaucoma   . Heart attack (HCC)   . HLD (hyperlipidemia)   . HTN (hypertension)   . Lower back pain   . Sleep apnea   . Tinnitus     Patient Active Problem List   Diagnosis Date Noted  . Acute cystitis with hematuria 09/22/2014  . 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 CATARACT EXTRACTION W/PHACO Right 11/16/2015   Procedure: CATARACT EXTRACTION PHACO AND INTRAOCULAR LENS PLACEMENT (IOC);  Surgeon: Lockie Mola, MD;  Location: [facility];  Service: Ophthalmology;  Laterality: Right;  DIABETIC - oral meds NEEDS [place] INTERPRETER  . COLONOSCOPY WITH PROPOFOL N/A 12/10/2014   Procedure: COLONOSCOPY WITH PROPOFOL;  Surgeon: Wallace Cullens, MD;  Location: [facility] ENDOSCOPY;  Service: Gastroenterology;  Laterality: N/A;  . CORONARY ANGIOPLASTY WITH STENT PLACEMENT      Prior to Admission medications   Medication Sig Start Date End Date Taking? Authorizing Provider  aspirin EC 81 MG tablet Take by mouth.    [provider]  betamethasone valerate ointment (VALISONE) 0.1 % Apply 1 application topically 2 (two) times daily. Apply to foreskin twice daily 04/11/20   [name], MD  CRESTOR 40 MG tablet  08/02/14   [provider]  fluticasone Aleda Grana) 50 MCG/ACT nasal spray  09/20/14   [provider]  latanoprost (XALATAN) 0.005 % ophthalmic solution  09/03/14   [provider]  linagliptin (TRADJENTA) 5 MG TABS tablet Take 5 mg by mouth daily.    [provider]  lisinopril (PRINIVIL,ZESTRIL) 2.5 MG tablet  06/21/14   [provider]  meloxicam (MOBIC) 15 MG tablet  Take by mouth.    [provider]  metFORMIN (GLUCOPHAGE-XR) 500 MG 24 hr tablet  09/08/14   [provider]  sitaGLIPtin (JANUVIA) 25 MG tablet Take 25 mg by mouth daily.    [provider]  tamsulosin (FLOMAX) 0.4 MG CAPS capsule Take 1 capsule (0.4 mg total) by mouth daily. 04/11/20   [name], MD  timolol (TIMOPTIC) 0.5 % ophthalmic solution  09/06/14   [provider]    Allergies Zolpidem  Family History  Problem Relation Age of Onset  . Leukemia Son   . Kidney disease Neg Hx   . Prostate cancer Neg Hx     Social History Social History   Tobacco  Use  . Smoking status: Former Games developer  . Smokeless tobacco: Never Used  . Tobacco comment: Quit 11 years ago  Substance Use Topics  . Alcohol use: No    Alcohol/week: 0.0 standard drinks  . Drug use: No      Review of Systems Constitutional: No fever/chills, dizziness Eyes: No visual changes that are new, baseline issues seen by eye doctor ENT: No sore throat. Cardiovascular: Denies chest pain. Respiratory: Denies shortness of breath. Gastrointestinal: No abdominal pain.  No nausea, no vomiting.  No diarrhea.  No constipation. Genitourinary: Negative for dysuria. Musculoskeletal: Right flank pain Skin: Negative for rash. Neurological: Negative for headaches, focal weakness or numbness. All other ROS negative ____________________________________________   PHYSICAL EXAM:  VITAL SIGNS: ED Triage Vitals  Enc Vitals Group     BP 04/19/20 1720 120/64     Pulse Rate 04/19/20 1720 95     Resp 04/19/20 1720 18     Temp 04/19/20 1720 98.7 F (37.1 C)     Temp Source 04/19/20 1720 Oral     SpO2 04/19/20 1720 95 %     Weight 04/19/20 1738 184 lb (83.5 kg)     Height 04/19/20 1738 5\' 6"  (1.676 m)     Head Circumference --      Peak Flow --      Pain Score 04/19/20 1725 3     Pain Loc --      Pain Edu? --      Excl. in GC? --     Constitutional: Alert and oriented. Well appearing and in no acute distress. Eyes: Conjunctivae are normal. EOMI. Head: Atraumatic. Nose: No congestion/rhinnorhea. Mouth/Throat: Mucous membranes are moist.   Neck: No stridor. Trachea Midline. FROM Cardiovascular: Normal rate, regular rhythm. Grossly normal heart sounds.  Good peripheral circulation. Respiratory: Normal respiratory effort.  No retractions. Lungs CTAB. Gastrointestinal: Soft and nontender. No distention. No abdominal bruits.  Musculoskeletal: Tenderness over the right upper flank right lower flank, pain with twisting movements and with trying to sit up.  No other tenderness on  extremities. Neurologic:  Normal speech and language. No gross focal neurologic deficits are appreciated.  Cranial nerves are intact other than baseline vision difficulties which makes finger-to-nose difficult for patient Skin:  Skin is warm, dry and intact. No rash noted. Psychiatric: Mood and affect are normal. Speech and behavior are normal. GU: Deferred  Back: No CTL spine tenderness ____________________________________________   LABS (all labs ordered are listed, but only abnormal results are displayed)  Labs Reviewed  BASIC METABOLIC PANEL - Abnormal; Notable for the following components:      Result Value   Sodium 134 (*)    CO2 21 (*)    Glucose, Bld 298 (*)    All other components within normal  limits  URINALYSIS, COMPLETE (UACMP) WITH MICROSCOPIC - Abnormal; Notable for the following components:   Color, Urine YELLOW (*)    APPearance HAZY (*)    Specific Gravity, Urine 1.033 (*)    Glucose, UA >=500 (*)    Hgb urine dipstick MODERATE (*)    Leukocytes,Ua MODERATE (*)    All other components within normal limits  CBC   ____________________________________________   ED ECG REPORT I, Concha Se, the attending physician, personally viewed and interpreted this ECG.  Normal sinus rhythm 95, no ST elevation, no T wave inversions, normal intervals ____________________________________________  RADIOLOGY Vela Prose, personally viewed and evaluated these images (plain radiographs) as part of my medical decision making, as well as reviewing the written report by the radiologist.  ED MD interpretation: No pneumonia or fracture noted  Official radiology report(s): DG Chest 2 View  Result Date: 04/19/2020 CLINICAL DATA:  Shortness of breath EXAM: CHEST - 2 VIEW COMPARISON:  None. FINDINGS: The heart size and mediastinal contours are within normal limits. Aortic knob calcifications are seen. Both lungs are clear. The visualized skeletal structures are unremarkable.  IMPRESSION: No active cardiopulmonary disease. Electronically Signed   By: Jonna Clark M.D.   On: 04/19/2020 18:34   DG Shoulder Right  Result Date: 04/19/2020 CLINICAL DATA:  Shortness of breath shoulder pain after fall EXAM: RIGHT SHOULDER - 2+ VIEW COMPARISON:  None. FINDINGS: There is no evidence of fracture or dislocation. 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 Mild overlying soft tissue swelling is seen. IMPRESSION: No acute osseous abnormality. Electronically Signed   By: Jonna Clark M.D.   On: 04/19/2020 18:34    ____________________________________________   PROCEDURES  Procedure(s) performed (including Critical Care):  Procedures   ____________________________________________   INITIAL IMPRESSION / ASSESSMENT AND PLAN / ED COURSE  Adrian Kim was evaluated in Emergency Department on 04/19/2020 for the symptoms described in the history of present illness. He was evaluated in the context of the global COVID-19 pandemic, which necessitated consideration that the patient might be at risk for infection with the SARS-CoV-2 virus that causes COVID-19. Institutional protocols and algorithms that pertain to the evaluation of patients at risk for COVID-19 are in a state of rapid change based on information released by regulatory bodies including the CDC and federal and state organizations. These policies and algorithms were followed during the patient's care in the ED.    Patient is a 80 year old who comes in for fall.  Labs are ordered due to patient's dizziness to evaluate for Electra abnormalities, AKI, DKA.  Patient did not hit his head so I low suspicion for intracranial hemorrhage.  He has no CTL spine tenderness to suggest vertebrae fracture.  His tenderness is mostly on the right mid back.  He is painful with moving around.  X-ray without evidence of fracture although I did explain to patient and family that sometimes it can miss small fractures.   We discussed doing some Toradol, Tylenol, lidocaine patch and ambulating patient.  This time patient's oxygen level is normal at 95 to 96%.  Nurses will teach patient how to use incentive spirometry.  His urine did have a little bit of leuk esterase and some white cells.  Patient has a little dysuria.  Hard to tell if this is new or not.  We will give a short course of Keflex and have family follow-up with urine culture.  Pt got meds and was able to ambulate around. Continues to  have no dizzyness so low suspicion for stroke.  D/w pt elevated sugar and needs f./u with PCP. They expressed understanding  And  Were requesting discharge home.  I discussed the provisional nature of ED diagnosis, the treatment so far, the ongoing plan of care, follow up appointments and return precautions with the patient and any family or support people present. They expressed understanding and agreed with the plan, discharged home.          ____________________________________________   FINAL CLINICAL IMPRESSION(S) / ED DIAGNOSES   Final diagnoses:  None      MEDICATIONS GIVEN DURING THIS VISIT:  Medications  acetaminophen (TYLENOL) tablet 1,000 mg (1,000 mg Oral Given 04/19/20 2223)  ketorolac (TORADOL) 30 MG/ML injection 30 mg (30 mg Intramuscular Given 04/19/20 2223)     ED Discharge Orders         Ordered    cephALEXin (KEFLEX) 500 MG capsule  2 times daily        04/19/20 2239    ibuprofen (ADVIL) 400 MG tablet  Every 6 hours PRN        04/19/20 2239           Note:  This document was prepared using Dragon voice recognition software and may include unintentional dictation errors.   Concha Se, MD 04/20/20 [phone]

## 2020-04-21 LAB — URINE CULTURE

## 2020-04-25 ENCOUNTER — Other Ambulatory Visit: Payer: Self-pay | Admitting: *Deleted

## 2020-05-16 ENCOUNTER — Other Ambulatory Visit: Payer: Self-pay

## 2020-05-16 ENCOUNTER — Encounter: Payer: Self-pay | Admitting: Physician Assistant

## 2020-05-16 ENCOUNTER — Ambulatory Visit (INDEPENDENT_AMBULATORY_CARE_PROVIDER_SITE_OTHER): Payer: Medicare Other | Admitting: Physician Assistant

## 2020-05-16 VITALS — BP 97/63 | HR 102 | Ht 66.0 in | Wt 184.0 lb

## 2020-05-16 DIAGNOSIS — N401 Enlarged prostate with lower urinary tract symptoms: Secondary | ICD-10-CM | POA: Diagnosis not present

## 2020-05-16 DIAGNOSIS — N471 Phimosis: Secondary | ICD-10-CM

## 2020-05-16 DIAGNOSIS — N4889 Other specified disorders of penis: Secondary | ICD-10-CM

## 2020-05-16 DIAGNOSIS — R350 Frequency of micturition: Secondary | ICD-10-CM

## 2020-05-16 LAB — BLADDER SCAN AMB NON-IMAGING: Scan Result: 60

## 2020-05-16 MED ORDER — MIRABEGRON ER 25 MG PO TB24: 25.0000 mg | ORAL_TABLET | Freq: Every day | ORAL | 0 refills | Status: DC

## 2020-05-16 NOTE — Patient Instructions (Addendum)
Circumcision, Adult  Circumcision is a surgery to remove the foreskin of the penis or to cut the foreskin so the space between the skin and tip of the penis is larger. When the foreskin is cut but not removed, the procedure is called a dorsal incision or dorsal circumcision. A dorsal circumcision leaves the entire foreskin but makes the end of the foreskin looser so it can be pulled back over the head of the penis. Tell a health care provider about:  Any allergies you have.  All medicines you are taking, including vitamins, herbs, eye drops, creams, and over-the-counter medicines.  Any problems you or family members have had with anesthetic medicines.  Any blood disorders you have.  Any surgeries you have had.  Any medical conditions you have, including the common cold or another infection. What are the risks? Generally, this is a safe procedure. However, problems may occur, including:  Bleeding.  Infection.  Pain.  Allergic reactions to medicines.  Opening of the surgical wound. This can occur from an unwanted erection after surgery. What happens before the procedure? Staying hydrated Follow instructions from your health care provider about hydration, which may include:  Up to 2 hours before the procedure - you may continue to drink clear liquids, such as water, clear fruit juice, black coffee, and plain tea.   Eating and drinking restrictions Follow instructions from your health care provider about eating and drinking, which may include:  8 hours before the procedure - stop eating heavy meals or foods, such as meat, fried foods, or fatty foods.  6 hours before the procedure - stop eating light meals or foods, such as toast or cereal.  6 hours before the procedure - stop drinking milk or drinks that contain milk.  2 hours before the procedure - stop drinking clear liquids. Medicines  Ask your health care provider about: ? Changing or stopping your regular medicines.  This is especially important if you take diabetes medicines or blood thinners. ? Taking medicines such as aspirin and ibuprofen. These medicines can thin your blood. Do not take these medicines unless your health care provider tells you to take them. ? Taking over-the-counter medicines, vitamins, herbs, and supplements. General instructions  Do not use any products that contain nicotine or tobacco for at least 4 weeks before the procedure. These products include cigarettes, e-cigarettes, and chewing tobacco. If you need help quitting, ask your health care provider.  Plan to have someone take you home from the hospital or clinic.  If you will be going home right after the procedure, plan to have someone with you for 24 hours.  Ask your health care provider: ? How your surgery site will be marked. ? What steps will be taken to help prevent infection. These may include:  Washing skin with a germ-killing soap.  Taking antibiotic medicine.   What happens during the procedure?  An IV may be inserted into one of your veins.  You will be given one or more of the following: ? A medicine to help you relax (sedative). ? A medicine to numb the area (local anesthetic). This will be injected with a needle into the skin of your penis.  An incision will be made to remove or cut the foreskin.  Absorbable stitches (sutures) may be used to close the incision.  A bandage (dressing) will be applied to the incision site.  The IV will be removed. The procedure may vary among health care providers and hospitals. What happens after the  procedure?  Your blood pressure, heart rate, breathing rate, and blood oxygen level will be monitored until you leave the hospital or clinic.  Do not get out of bed until your health care provider approves.  If you were given a sedative during the procedure, it can affect you for several hours. Do not drive or operate machinery until your health care provider says that  it is safe. Summary  Circumcision is a surgery to remove the foreskin of the penis or to cut the foreskin so the space between the skin and tip of the penis is larger.  If you will be going home right after the procedure, plan to have someone with you for 24 hours.  Absorbable sutures may be used to close the incision after the foreskin has been removed or cut. This information is not intended to replace advice given to you by your health care provider. Make sure you discuss any questions you have with your health care provider. Document Revised: 12/10/2018 Document Reviewed: 12/10/2018 Elsevier Patient Education  2021 Elsevier Inc.  Cystoscopy Cystoscopy is a procedure that is used to help diagnose and sometimes treat conditions that affect the lower urinary tract. The lower urinary tract includes the bladder and the urethra. The urethra is the tube that drains urine from the bladder. Cystoscopy is done using a thin, tube-shaped instrument with a light and camera at the end (cystoscope). The cystoscope may be hard or flexible, depending on the goal of the procedure. The cystoscope is inserted through the urethra, into the bladder. Cystoscopy may be recommended if you have:  Urinary tract infections that keep coming back.  Blood in the urine (hematuria).  An inability to control when you urinate (urinary incontinence) or an overactive bladder.  Unusual cells found in a urine sample.  A blockage in the urethra, such as a urinary stone.  Painful urination.  An abnormality in the bladder found during an intravenous pyelogram (IVP) or CT scan. Cystoscopy may also be done to remove a sample of tissue to be examined under a microscope (biopsy). Tell a health care provider about:  Any allergies you have.  All medicines you are taking, including vitamins, herbs, eye drops, creams, and over-the-counter medicines.  Any problems you or family members have had with anesthetic  medicines.  Any blood disorders you have.  Any surgeries you have had.  Any medical conditions you have.  Whether you are pregnant or may be pregnant. What are the risks? Generally, this is a safe procedure. However, problems may occur, including:  Infection.  Bleeding.  Allergic reactions to medicines.  Damage to other structures or organs. What happens before the procedure? Medicines Ask your health care provider about:  Changing or stopping your regular medicines. This is especially important if you are taking diabetes medicines or blood thinners.  Taking medicines such as aspirin and ibuprofen. These medicines can thin your blood. Do not take these medicines unless your health care provider tells you to take them.  Taking over-the-counter medicines, vitamins, herbs, and supplements. Tests You may have an exam or testing, such as:  X-rays of the bladder, urethra, or kidneys.  CT scan of the abdomen or pelvis.  Urine tests to check for signs of infection. General instructions  Follow instructions from your health care provider about eating or drinking restrictions.  Ask your health care provider what steps will be taken to help prevent infection. These steps may include: ? Washing skin with a germ-killing soap. ? Taking antibiotic  medicine.  Plan to have a responsible adult take you home from the hospital or clinic. What happens during the procedure?  You will be given one or more of the following: ? A medicine to help you relax (sedative). ? A medicine to numb the area (local anesthetic).  The area around the opening of your urethra will be cleaned.  The cystoscope will be passed through your urethra into your bladder.  Germ-free (sterile) fluid will flow through the cystoscope to fill your bladder. The fluid will stretch your bladder so that your health care provider can clearly examine your bladder walls.  Your doctor will look at the urethra and bladder.  Your doctor may take a biopsy or remove stones.  The cystoscope will be removed, and your bladder will be emptied. The procedure may vary among health care providers and hospitals.   What can I expect after the procedure? After the procedure, it is common to have:  Some soreness or pain in your abdomen and urethra.  Urinary symptoms. These include: ? Mild pain or burning when you urinate. Pain should stop within a few minutes after you urinate. This may last for up to 1 week. ? A small amount of blood in your urine for several days. ? Feeling like you need to urinate but producing only a small amount of urine. Follow these instructions at home: Medicines  Take over-the-counter and prescription medicines only as told by your health care provider.  If you were prescribed an antibiotic medicine, take it as told by your health care provider. Do not stop taking the antibiotic even if you start to feel better. General instructions  Return to your normal activities as told by your health care provider. Ask your health care provider what activities are safe for you.  If you were given a sedative during the procedure, it can affect you for several hours. Do not drive or operate machinery until your health care provider says that it is safe.  Watch for any blood in your urine. If the amount of blood in your urine increases, call your health care provider.  Follow instructions from your health care provider about eating or drinking restrictions.  If a tissue sample was removed for testing (biopsy) during your procedure, it is up to you to get your test results. Ask your health care provider, or the department that is doing the test, when your results will be ready.  Drink enough fluid to keep your urine pale yellow.  Keep all follow-up visits. This is important. Contact a health care provider if:  You have pain that gets worse or does not get better with medicine, especially pain when you  urinate.  You have trouble urinating.  You have more blood in your urine. Get help right away if:  You have blood clots in your urine.  You have abdominal pain.  You have a fever or chills.  You are unable to urinate. Summary  Cystoscopy is a procedure that is used to help diagnose and sometimes treat conditions that affect the lower urinary tract.  Cystoscopy is done using a thin, tube-shaped instrument with a light and camera at the end.  After the procedure, it is common to have some soreness or pain in your abdomen and urethra.  Watch for any blood in your urine. If the amount of blood in your urine increases, call your health care provider.  If you were prescribed an antibiotic medicine, take it as told by your health care  provider. Do not stop taking the antibiotic even if you start to feel better. This information is not intended to replace advice given to you by your health care provider. Make sure you discuss any questions you have with your health care provider. Document Revised: 09/18/2019 Document Reviewed: 09/18/2019 Elsevier Patient Education  2021 Elsevier Inc.  Transrectal Ultrasound A transrectal ultrasound is a procedure that uses sound waves to create images of the prostate gland and nearby tissues. For this procedure, an ultrasound probe is placed in the rectum. The probe sends sound waves through the wall of the rectum into the prostate gland, which is located in front of the rectum. The images show the size and shape of the prostate gland and nearby structures. You may need this test if you have:  Trouble urinating.  Trouble getting your partner pregnant (infertility).  An abnormal result from a prostate screening exam. Tell a health care provider about:  Any allergies you have.  All medicines you are taking, including vitamins, herbs, eye drops, creams, and over-the-counter medicines.  Any blood disorders you have.  Any medical conditions you  have.  Any surgeries you have had. What are the risks? Generally, this is a safe procedure. However, problems may occur, including:  Discomfort during the procedure. This is rare.  Blood in your urine or sperm after the procedure. This may occur if a sample of tissue (biopsy) is taken during the procedure. What happens before the procedure?  Your health care provider may instruct you to use an enema 1-4 hours before the procedure. Follow instructions from your health care provider about how to do the enema.  Ask your health care provider about: ? Changing or stopping your regular medicines. This is especially important if you are taking diabetes medicines or blood thinners. ? Taking medicines such as aspirin and ibuprofen. These medicines can thin your blood. Do not take these medicines unless your health care provider tells you to take them. ? Taking over-the-counter medicines, vitamins, herbs, and supplements. What happens during the procedure?  You will be asked to lie down on your left side on an exam table.  You will bend your knees toward your chest.  Gel will be put on a probe, and the probe will be gently inserted into your rectum. This may cause a feeling of fullness.  The probe will send signals to a computer that will create images. These will be displayed on a monitor that looks like a small television screen.  The technician will slightly rotate the probe throughout the procedure. While rotating the probe, he or she will view and capture images of the prostate gland and the surrounding structures from different angles.  Your health care provider may take a biopsy sample of prostate tissue during the procedure. The images captured from the ultrasound will help guide the needle used to remove a sample of tissue. The sample will be sent to a lab for testing.  The probe will be removed. The procedure may vary among health care providers and hospitals. What can I expect after  the procedure?  It is up to you to get the results of your procedure. Ask your health care provider, or the department that is doing the procedure, when your results will be ready.  Keep all follow-up visits as told by your health care provider. This is important. Summary  A transrectal ultrasound is a procedure that uses sound waves to create images of the prostate gland and nearby tissues.  The  images show the size and shape of the prostate gland and nearby structures.  Before the procedure, ask your health care provider about changing or stopping your regular medicines. This is especially important if you are taking diabetes medicines or blood thinners. This information is not intended to replace advice given to you by your health care provider. Make sure you discuss any questions you have with your health care provider. Document Revised: 04/01/2019 Document Reviewed: 04/01/2019 Elsevier Patient Education  2021 [employer].

## 2020-05-16 NOTE — Progress Notes (Signed)
05/16/2020 9:46 AM   Adrian Kim 07-19-1940 [id number]  CC: Chief Complaint  Patient presents with  . phimosis   HPI: Adrian Kim is a 80 y.o. 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 He is accompanied today by his son, who contributes to HPI and assists in interpretation. Phone interpreter also utilized, but was discontinued mid-visit due to confusion between parties.  Today he reports the topical steroid helped with his phimosis. He states his foreskin has decreased in length with use from 2 to 1 inches. He is still unable to retract his foreskin.  Additionally, he reports stable bothersome urinary symptoms including frequency every 2 hours, urgency, urge incontinence, weak stream, and intermittency. He has not noticed any change in these symptoms on Flomax.  He clarifies that he experiences both urge incontinence when he delays urination after sensing the urge to do so as well as postvoid dribbling.  Notably, patient reports he is prone to dizziness at baseline. He sustained a fall earlier this month after starting Flomax but states it is not unusual for him to fall. PVR 60mL.  Patient today is mostly concerned about the shrinking size of his penis.  He states he believes this is due to his umbilical cord being cut too short at birth.  He is very concerned that if he undergoes circumcision, his penis will no longer be visible.  Additionally, he states that with his underlying vision issues, dizziness, and other medical problems, his "life is over."  He was initially reluctant to pursue any additional testing or procedures for management of his urinary problems, but subsequently reported he would pursue "anything you recommend."  PMH: Past Medical History:  Diagnosis Date  . Arthritis   . Diabetes (HCC)   . Glaucoma   .  Heart attack (HCC)   . HLD (hyperlipidemia)   . HTN (hypertension)   . Lower back pain   . Sleep apnea   . Tinnitus     Surgical History: Past Surgical History:  Procedure Laterality Date  . CATARACT EXTRACTION W/PHACO Right 11/16/2015   Procedure: CATARACT EXTRACTION PHACO AND INTRAOCULAR LENS PLACEMENT (IOC);  Surgeon: Lockie Mola, MD;  Location: [facility];  Service: Ophthalmology;  Laterality: Right;  DIABETIC - oral meds NEEDS [place] INTERPRETER  . COLONOSCOPY WITH PROPOFOL N/A 12/10/2014   Procedure: COLONOSCOPY WITH PROPOFOL;  Surgeon: Wallace Cullens, MD;  Location: [facility];  Service: Gastroenterology;  Laterality: N/A;  . CORONARY ANGIOPLASTY WITH STENT PLACEMENT      Home Medications:  Allergies as of 05/16/2020      Reactions   Zolpidem    Other reaction(s): Other (See Comments) Confusion      Medication List       Accurate as of May 16, 2020  9:46 AM. If you have any questions, ask your nurse or doctor.        aspirin EC 81 MG tablet Take by mouth.   betamethasone valerate ointment 0.1 % Commonly known as: VALISONE Apply 1 application topically 2 (two) times daily. Apply to foreskin twice daily   Crestor 40 MG tablet Generic drug: rosuvastatin   fluticasone 50 MCG/ACT nasal spray Commonly known as: FLONASE   ibuprofen 400 MG tablet Commonly known as: ADVIL Take 1 tablet (400 mg total) by mouth every 6 (six) hours as needed.   latanoprost 0.005 % ophthalmic solution Commonly known  as: XALATAN   linagliptin 5 MG Tabs tablet Commonly known as: TRADJENTA Take 5 mg by mouth daily.   lisinopril 2.5 MG tablet Commonly known as: ZESTRIL   metFORMIN 500 MG 24 hr tablet Commonly known as: GLUCOPHAGE-XR   sitaGLIPtin 25 MG tablet Commonly known as: JANUVIA Take 25 mg by mouth daily.   tamsulosin 0.4 MG Caps capsule Commonly known as: FLOMAX Take 1 capsule (0.4 mg total) by mouth daily.   timolol 0.5 % ophthalmic  solution Commonly known as: TIMOPTIC       Allergies:  Allergies  Allergen Reactions  . Zolpidem     Other reaction(s): Other (See Comments) Confusion    Family History: Family History  Problem Relation Age of Onset  . Leukemia Son   . Kidney disease Neg Hx   . Prostate cancer Neg Hx     Social History:   reports that he has quit smoking. He has never used smokeless tobacco. He reports that he does not drink alcohol and does not use drugs.  Physical Exam: BP 97/63   Pulse (!) 102   Ht 5\' 6"  (1.676 m)   Wt 184 lb (83.5 kg)   BMI 29.70 kg/m   Constitutional:  Alert and oriented, no acute distress, nontoxic appearing HEENT: Left black eye with left lateral subconjunctival hemorrhage Cardiovascular: No clubbing, cyanosis, or edema Respiratory: Normal respiratory effort, no increased work of breathing GU: Uncircumcised penis with phimotic foreskin, unable to retract.  Linear fissures visualized at the distal tip of the foreskin with my attempt to retract. Skin: No rashes, bruises or suspicious lesions Neurologic: Requires light assist for transfer, ambulates with cane Psychiatric: Normal mood and affect  Laboratory Data: Results for orders placed or performed in visit on 05/16/20  BLADDER SCAN AMB NON-IMAGING  Result Value Ref Range   Scan Result 60    Assessment & Plan:   1. Phimosis Persistent despite topical steroid cream, foreskin remains unable to be retracted.  I counseled the patient that his phimosis is NOT playing a role in his decreased penile size and offered him circumcision versus dorsal slit for management of this. I explained that dorsal slit is a less invasive option, but comes with inferior cosmesis due to loose, redundant tissue. Patient is more interested in circumcision, but is concerned that circumcision will reduce his penile size. I provided extensive reassurance that circumcision does not change the length of the penis, however given that his  foreskin extends past the end of his penis, it may appear shorter in the absence of this tissue. Reiterated that this does not mean that his penis itself would have been altered by the circumcision.  Given patient's urinary symptoms as below, I recommended deferring scheduling circumcision pending bladder outlet consultation, as there may be an opportunity for combined outlet procedure + circumcision.  2. Benign prostatic hyperplasia with urinary frequency Stable despite Flomax, PVR WNL. Given his recent fall and dizziness/unsteadiness at baseline, I'm stopping Flomax today and do not recommend alpha blockers moving forward.  I recommended cystoscopy and TRUS for bladder outlet consultation. If he is a candidate for outlet procedure, recommend consideration of concurrent circumcision as above. Alternatively, if found to have urinary obstruction on cysto, may consider finasteride as a more conservative option.  Will start Myrbetriq 25mg  daily and plan for symptom recheck in 1 month with cysto and TRUS. - BLADDER SCAN AMB NON-IMAGING - mirabegron ER (MYRBETRIQ) 25 MG TB24 tablet; Take 1 tablet (25 mg total) by mouth daily.  Dispense: 28 tablet; Refill: 0  3. Reduced size of penis Reassured patient that penile size can decrease with age, that his penis size is normal, that his penis is not absent, that this is not associated with the length of his umbilical stump as a newborn, and that his penis size is not playing a role in his urinary problems as above.   Return in about 4 weeks (around 06/13/2020) for Symptom recheck with cysto and TRUS with Dr. Lonna Cobb.   I spent 90 minutes on the day of the encounter to include pre-visit record review, face-to-face time with the patient, and post-visit ordering of tests.   Carman Ching, PA-C  [facility] [address], [address] [place], [place] [address] [phone]

## 2020-05-19 ENCOUNTER — Ambulatory Visit: Payer: Self-pay | Admitting: Physician Assistant

## 2020-06-07 DIAGNOSIS — I219 Acute myocardial infarction, unspecified: Secondary | ICD-10-CM | POA: Insufficient documentation

## 2020-06-19 NOTE — Progress Notes (Signed)
   06/20/20  CC:  Chief Complaint  Patient presents with  . Cysto    HPI: Refer to my prior note 04/11/2020 and S. Vaillancourt's note 05/16/2020  No improvement in LUTS on tamsulosin and had orthostatic changes  Med was discontinued last visit and he was given Myrbetriq 25 mg samples  No significant improvement in voiding pattern  Most bothersome symptoms urinary frequency and nocturia every 2 hours  + Urge with urge incontinence and decreased stream  Blood pressure 100/63, pulse 76, height 5\' 6"  (1.676 m), weight 184 lb (83.5 kg). NED. A&Ox3.   No respiratory distress   Abd soft, NT, ND Normal phallus with bilateral descended testicles  Cystoscopy Procedure Note  Patient identification was confirmed, informed consent was obtained, and patient was prepped using Betadine solution.  Lidocaine jelly was administered per urethral meatus.     Pre-Procedure: - Inspection reveals phimosis with unretractable prepuce.   Procedure: The flexible cystoscope was introduced through the phimotic prepuce and went directly into the meatus  - No urethral strictures/lesions are present. - Mild to moderate lateral lobe enlargement prostate  - Mild elevation bladder neck - Bilateral ureteral orifices identified - Bladder mucosa  reveals no ulcers, tumors, or lesions - No bladder stones - No trabeculation  Retroflexion shows no intravesical median lobe   Post-Procedure: - Patient tolerated the procedure well  Assessment/ Plan:  Mild to moderate prostate enlargement  Based on prostate size endoscopically TRUS was not performed as his prostate volume is <40 g  His most bothersome symptoms are storage related; would not recommend outlet surgery without a urodynamic study and based on language barrier this test may be difficult to perform  Increase Myrbetriq to 50 mg daily-samples given  1 month follow-up for symptom reassessment  Would lean towards dorsal slit for his phimosis  rather than circumcision   , MD

## 2020-06-20 ENCOUNTER — Other Ambulatory Visit: Payer: Self-pay

## 2020-06-20 ENCOUNTER — Encounter: Payer: Self-pay | Admitting: Urology

## 2020-06-20 ENCOUNTER — Ambulatory Visit (INDEPENDENT_AMBULATORY_CARE_PROVIDER_SITE_OTHER): Payer: Medicare Other | Admitting: Urology

## 2020-06-20 VITALS — BP 100/63 | HR 76 | Ht 66.0 in | Wt 184.0 lb

## 2020-06-20 DIAGNOSIS — R350 Frequency of micturition: Secondary | ICD-10-CM | POA: Diagnosis not present

## 2020-06-20 DIAGNOSIS — N401 Enlarged prostate with lower urinary tract symptoms: Secondary | ICD-10-CM | POA: Diagnosis not present

## 2020-06-21 LAB — URINALYSIS, COMPLETE
Bilirubin, UA: NEGATIVE
Ketones, UA: NEGATIVE
Leukocytes,UA: NEGATIVE
Nitrite, UA: NEGATIVE
Protein,UA: NEGATIVE
Specific Gravity, UA: 1.01 (ref 1.005–1.030)
Urobilinogen, Ur: 0.2 mg/dL (ref 0.2–1.0)
pH, UA: 5 (ref 5.0–7.5)

## 2020-06-21 LAB — MICROSCOPIC EXAMINATION: Bacteria, UA: NONE SEEN

## 2020-07-25 ENCOUNTER — Ambulatory Visit: Payer: Self-pay | Admitting: Physician Assistant

## 2020-08-15 ENCOUNTER — Ambulatory Visit (INDEPENDENT_AMBULATORY_CARE_PROVIDER_SITE_OTHER): Payer: Medicare Other | Admitting: Physician Assistant

## 2020-08-15 ENCOUNTER — Other Ambulatory Visit: Payer: Self-pay

## 2020-08-15 VITALS — BP 98/62 | HR 77 | Ht 70.0 in | Wt 184.0 lb

## 2020-08-15 DIAGNOSIS — N401 Enlarged prostate with lower urinary tract symptoms: Secondary | ICD-10-CM

## 2020-08-15 DIAGNOSIS — R350 Frequency of micturition: Secondary | ICD-10-CM

## 2020-08-15 LAB — BLADDER SCAN AMB NON-IMAGING

## 2020-08-15 MED ORDER — GEMTESA 75 MG PO TABS: 75.0000 mg | ORAL_TABLET | Freq: Every day | ORAL | 0 refills | Status: DC

## 2020-08-15 NOTE — Progress Notes (Signed)
08/15/2020 10:02 AM   Adrian Kim 1940/11/16 [id number]  CC: Chief Complaint  Patient presents with   Benign Prostatic Hypertrophy   HPI: Adrian Kim is a 80 y.o. male with PMH BPH with weak stream, OAB wet, nocturia, and phimosis who presents today for symptom recheck on Myrbetriq 50 mg daily.  He previously discontinued tamsulosin due to orthostasis.  He is accompanied today by his son, who assists in interpretation.  A telephone interpreter was initially used during the visit, but discontinued as the patient was not speaking to them.  Today he reports worsened urinary urgency on Myrbetriq.  He states he previously could hold his urine, but he can no longer do this.  He is most bothered by his urinary leakage.  His son reports that he does not think his father would be able to tolerate urodynamics as he struggled to tolerate cystoscopy.  He wishes to defer this for this reason.  Bladder scan with ; patient's son reports his last void was approximately 2 hours prior.  PMH: Past Medical History:  Diagnosis Date   Arthritis    Diabetes (HCC)    Glaucoma    Heart attack (HCC)    HLD (hyperlipidemia)    HTN (hypertension)    Lower back pain    Sleep apnea    Tinnitus     Surgical History: Past Surgical History:  Procedure Laterality Date   CATARACT EXTRACTION W/PHACO Right 11/16/2015   Procedure: CATARACT EXTRACTION PHACO AND INTRAOCULAR LENS PLACEMENT (IOC);  Surgeon: Lockie Mola, MD;  Location: [facility];  Service: Ophthalmology;  Laterality: Right;  DIABETIC - oral meds NEEDS Greek INTERPRETER   COLONOSCOPY WITH PROPOFOL N/A 12/10/2014   Procedure: COLONOSCOPY WITH PROPOFOL;  Surgeon: Wallace Cullens, MD;  Location: [facility];  Service: Gastroenterology;  Laterality: N/A;   CORONARY ANGIOPLASTY WITH STENT PLACEMENT      Home Medications:  Allergies as of 08/15/2020       Reactions   Zolpidem    Other reaction(s): Other (See  Comments) Confusion        Medication List        Accurate as of August 15, 2020 10:02 AM. If you have any questions, ask your nurse or doctor.          aspirin EC 81 MG tablet Take by mouth.   betamethasone valerate ointment 0.1 % Commonly known as: VALISONE Apply 1 application topically 2 (two) times daily. Apply to foreskin twice daily   Crestor 40 MG tablet Generic drug: rosuvastatin   fluticasone 50 MCG/ACT nasal spray Commonly known as: FLONASE   ibuprofen 400 MG tablet Commonly known as: ADVIL Take 1 tablet (400 mg total) by mouth every 6 (six) hours as needed.   latanoprost 0.005 % ophthalmic solution Commonly known as: XALATAN   linagliptin 5 MG Tabs tablet Commonly known as: TRADJENTA Take 5 mg by mouth daily.   lisinopril 2.5 MG tablet Commonly known as: ZESTRIL   metFORMIN 500 MG 24 hr tablet Commonly known as: GLUCOPHAGE-XR   mirabegron ER 25 MG Tb24 tablet Commonly known as: MYRBETRIQ Take 1 tablet (25 mg total) by mouth daily.   sitaGLIPtin 25 MG tablet Commonly known as: JANUVIA Take 25 mg by mouth daily.   timolol 0.5 % ophthalmic solution Commonly known as: TIMOPTIC        Allergies:  Allergies  Allergen Reactions   Zolpidem     Other reaction(s): Other (See Comments) Confusion    Family History: Family  History  Problem Relation Age of Onset   Leukemia Son    Kidney disease Neg Hx    Prostate cancer Neg Hx     Social History:   reports that he has quit smoking. He has never used smokeless tobacco. He reports that he does not drink alcohol and does not use drugs.  Physical Exam: BP 98/62   Pulse 77   Ht 5\' 10"  (1.778 m)   Wt 184 lb (83.5 kg)   BMI 26.40 kg/m   Constitutional:  Alert and oriented, no acute distress, nontoxic appearing HEENT: [place], AT Cardiovascular: No clubbing, cyanosis, or edema Respiratory: Normal respiratory effort, no increased work of breathing Skin: No rashes, bruises or suspicious  lesions Neurologic: Grossly intact, no focal deficits, moving all 4 extremities Psychiatric: Normal mood and affect  Laboratory Data: Results for orders placed or performed in visit on 08/15/20  Bladder Scan (Post Void Residual) in office  Result Value Ref Range   Scan Result    Assessment & Plan:   1. Benign prostatic hyperplasia with urinary frequency Symptomatic worsening on Myrbetriq.  We discussed alternative therapy today including anticholinergics, Gemtesa, PTNS, intravesical Botox, InterStim, and or urodynamics for further evaluation of his bladder function.  Patient's son wishes to proceed with a trial of Gemtesa.  If this fails, okay to try trospium.  I think is reasonable to defer urodynamics in this patient who poorly tolerated cystoscopy.  If pharmacotherapy fails and patient declines alternative OAB therapies, we discussed condom catheters versus incontinence clamps.  We will revisit in the future. - Bladder Scan (Post Void Residual) in office - Vibegron (GEMTESA) 75 MG TABS; Take 75 mg by mouth daily.  Dispense: 28 tablet; Refill: 0   Return in about 4 weeks (around 09/12/2020) for Symptom recheck with PVR.  Carman Ching, PA-C  [facility] [address], [address] [place], [place] [address] [phone]

## 2020-09-12 ENCOUNTER — Ambulatory Visit (INDEPENDENT_AMBULATORY_CARE_PROVIDER_SITE_OTHER): Payer: Medicare Other | Admitting: Physician Assistant

## 2020-09-12 ENCOUNTER — Encounter: Payer: Self-pay | Admitting: Physician Assistant

## 2020-09-12 ENCOUNTER — Other Ambulatory Visit: Payer: Self-pay

## 2020-09-12 VITALS — BP 94/55 | HR 73 | Ht 69.0 in | Wt 176.0 lb

## 2020-09-12 DIAGNOSIS — R350 Frequency of micturition: Secondary | ICD-10-CM | POA: Diagnosis not present

## 2020-09-12 DIAGNOSIS — N401 Enlarged prostate with lower urinary tract symptoms: Secondary | ICD-10-CM

## 2020-09-12 LAB — BLADDER SCAN AMB NON-IMAGING

## 2020-09-12 MED ORDER — TROSPIUM CHLORIDE ER 60 MG PO CP24: 1.0000 | ORAL_CAPSULE | Freq: Every day | ORAL | 0 refills | Status: DC

## 2020-09-12 MED ORDER — SILODOSIN 8 MG PO CAPS: 8.0000 mg | ORAL_CAPSULE | Freq: Every day | ORAL | 0 refills | Status: DC

## 2020-09-12 NOTE — Progress Notes (Signed)
09/12/2020 11:25 AM   Adrian Kim 1940-06-27 [id number]  CC: Chief Complaint  Patient presents with   Follow-up    HPI: Adrian Kim is a 80 y.o. male with PMH of weak stream, OAB wet, nocturia, and phimosis who presents today for symptom recheck on Gemtesa.  He previously discontinued tamsulosin due to orthostasis.  He is accompanied today by his son, who assists in interpretation.  Telephone interpreter was declined.    Today he reports worsened urinary symptoms on Gemtesa.  He states this is the least effective of the medications that he has tried.  He continues to report bothersome urinary frequency and urgency.  When asked about his weak stream, he describes an intermittent urinary stream associated with straining.  PVR 0 mL.  PMH: Past Medical History:  Diagnosis Date   Arthritis    Diabetes (HCC)    Glaucoma    Heart attack (HCC)    HLD (hyperlipidemia)    HTN (hypertension)    Lower back pain    Sleep apnea    Tinnitus     Surgical History: Past Surgical History:  Procedure Laterality Date   CATARACT EXTRACTION W/PHACO Right 11/16/2015   Procedure: CATARACT EXTRACTION PHACO AND INTRAOCULAR LENS PLACEMENT (IOC);  Surgeon: Lockie Mola, MD;  Location: [facility];  Service: Ophthalmology;  Laterality: Right;  DIABETIC - oral meds NEEDS Greek INTERPRETER   COLONOSCOPY WITH PROPOFOL N/A 12/10/2014   Procedure: COLONOSCOPY WITH PROPOFOL;  Surgeon: Wallace Cullens, MD;  Location: [facility];  Service: Gastroenterology;  Laterality: N/A;   CORONARY ANGIOPLASTY WITH STENT PLACEMENT      Home Medications:  Allergies as of 09/12/2020       Reactions   Zolpidem    Other reaction(s): Other (See Comments) Confusion        Medication List        Accurate as of September 12, 2020 11:25 AM. If you have any questions, ask your nurse or doctor.          aspirin EC 81 MG tablet Take by mouth.   betamethasone valerate ointment 0.1 % Commonly  known as: VALISONE Apply 1 application topically 2 (two) times daily. Apply to foreskin twice daily   Crestor 40 MG tablet Generic drug: rosuvastatin   fluticasone 50 MCG/ACT nasal spray Commonly known as: FLONASE   Gemtesa 75 MG Tabs Generic drug: Vibegron Take 75 mg by mouth daily.   ibuprofen 400 MG tablet Commonly known as: ADVIL Take 1 tablet (400 mg total) by mouth every 6 (six) hours as needed.   latanoprost 0.005 % ophthalmic solution Commonly known as: XALATAN   linagliptin 5 MG Tabs tablet Commonly known as: TRADJENTA Take 5 mg by mouth daily.   lisinopril 2.5 MG tablet Commonly known as: ZESTRIL   metFORMIN 500 MG 24 hr tablet Commonly known as: GLUCOPHAGE-XR   sitaGLIPtin 25 MG tablet Commonly known as: JANUVIA Take 25 mg by mouth daily.   timolol 0.5 % ophthalmic solution Commonly known as: TIMOPTIC        Allergies:  Allergies  Allergen Reactions   Zolpidem     Other reaction(s): Other (See Comments) Confusion    Family History: Family History  Problem Relation Age of Onset   Leukemia Son    Kidney disease Neg Hx    Prostate cancer Neg Hx     Social History:   reports that he has quit smoking. He has never used smokeless tobacco. He reports that he does not drink  alcohol and does not use drugs.  Physical Exam: BP (!) 94/55   Pulse 73   Ht 5\' 9"  (1.753 m)   Wt 176 lb (79.8 kg)   BMI 25.99 kg/m   Constitutional:  Alert and oriented, no acute distress, nontoxic appearing HEENT: [place], AT Cardiovascular: No clubbing, cyanosis, or edema Respiratory: Normal respiratory effort, no increased work of breathing Skin: No rashes, bruises or suspicious lesions Neurologic: Grossly intact, no focal deficits, moving all 4 extremities Psychiatric: Normal mood and affect  Laboratory Data: Results for orders placed or performed in visit on 09/12/20  Bladder Scan (Post Void Residual) in office  Result Value Ref Range   Scan Result 0mL     Assessment & Plan:   1. Benign prostatic hyperplasia with urinary frequency No symptomatic improvement on Gemtesa.  He is reporting both storage related and obstructive symptoms today.  At this point, I recommend starting the more prostate-specific alpha-blocker silodosin as well as trospium with the goal of achieving a synergistic effect of these 2 agents.  Patient and son are in agreement with this plan.  We discussed that he should stop silodosin immediately if he develops orthostasis on this.  He expressed understanding. - Bladder Scan (Post Void Residual) in office - silodosin (RAPAFLO) 8 MG CAPS capsule; Take 1 capsule (8 mg total) by mouth daily with breakfast.  Dispense: 30 capsule; Refill: 0 - Trospium Chloride 60 MG CP24; Take 1 capsule (60 mg total) by mouth daily.  Dispense: 30 capsule; Refill: 0   Return in about 4 weeks (around 10/10/2020) for Symptom recheck with PVR.  Carman Ching, PA-C  [facility] [address], [address] [place], [place] [address] [phone]

## 2020-09-12 NOTE — Patient Instructions (Signed)
We're starting you on two new medications today. Silodosin/Rapaflo works on your prostate to help you urinate easier, and trospium/Sanctura works on your bladder to help you urinate less often. I'm hoping that by combining these medications, we can help your urinary symptoms overall.  If you develop dizziness or lightheadedness with standing, this is because of the silodosin/Rapaflo and you should stop this medication.

## 2020-10-18 ENCOUNTER — Ambulatory Visit: Payer: Self-pay | Admitting: Physician Assistant

## 2020-10-31 ENCOUNTER — Ambulatory Visit (INDEPENDENT_AMBULATORY_CARE_PROVIDER_SITE_OTHER): Payer: Medicare Other | Admitting: Physician Assistant

## 2020-10-31 ENCOUNTER — Other Ambulatory Visit: Payer: Self-pay

## 2020-10-31 VITALS — BP 93/63 | HR 89 | Ht 67.0 in | Wt 157.0 lb

## 2020-10-31 DIAGNOSIS — R350 Frequency of micturition: Secondary | ICD-10-CM

## 2020-10-31 DIAGNOSIS — N401 Enlarged prostate with lower urinary tract symptoms: Secondary | ICD-10-CM | POA: Diagnosis not present

## 2020-10-31 LAB — BLADDER SCAN AMB NON-IMAGING: SCA Result: 29

## 2020-10-31 MED ORDER — MIRABEGRON ER 50 MG PO TB24: 50.0000 mg | ORAL_TABLET | Freq: Every day | ORAL | 2 refills | Status: DC

## 2020-10-31 MED ORDER — SILODOSIN 8 MG PO CAPS: 8.0000 mg | ORAL_CAPSULE | Freq: Every day | ORAL | 2 refills | Status: DC

## 2020-10-31 NOTE — Progress Notes (Signed)
10/31/2020 12:07 PM   Adrian Kim Feb 11, 1941 [id number]  CC: Chief Complaint  Patient presents with   Benign Prostatic Hypertrophy   HPI: Adrian Kim is a 80 y.o. male with PMH BPH with obstructive and storage symptoms including urgency, frequency, straining, and intermittency who previously failed tamsulosin due to orthostasis and OAB med monotherapy who presents today for symptom recheck on combination silodosin and trospium.  He is accompanied today by his son, who assists in interpretation.  Additional interpreter support declined.   Today he reports he is still having bothersome urgency and frequency every 2 hours.  He states he is tolerating the silodosin well with no dizziness.  He thinks his obstructive symptoms have somewhat improved, however upon further questioning it appears he continues to have some intermittency.  He is primarily bothered by his urgency and frequency.  To review, he previously elected to defer urodynamics as he poorly tolerated cystoscopy.  PVR 29 mL  PMH: Past Medical History:  Diagnosis Date   Arthritis    Diabetes (HCC)    Glaucoma    Heart attack (HCC)    HLD (hyperlipidemia)    HTN (hypertension)    Lower back pain    Sleep apnea    Tinnitus     Surgical History: Past Surgical History:  Procedure Laterality Date   CATARACT EXTRACTION W/PHACO Right 11/16/2015   Procedure: CATARACT EXTRACTION PHACO AND INTRAOCULAR LENS PLACEMENT (IOC);  Surgeon: Lockie Mola, MD;  Location: [facility];  Service: Ophthalmology;  Laterality: Right;  DIABETIC - oral meds NEEDS Greek INTERPRETER   COLONOSCOPY WITH PROPOFOL N/A 12/10/2014   Procedure: COLONOSCOPY WITH PROPOFOL;  Surgeon: Wallace Cullens, MD;  Location: [facility];  Service: Gastroenterology;  Laterality: N/A;   CORONARY ANGIOPLASTY WITH STENT PLACEMENT      Home Medications:  Allergies as of 10/31/2020       Reactions   Zolpidem    Other reaction(s): Other (See  Comments) Confusion        Medication List        Accurate as of October 31, 2020 12:07 PM. If you have any questions, ask your nurse or doctor.          STOP taking these medications    Trospium Chloride 60 MG Cp24 Stopped by: Carman Ching, PA-C       TAKE these medications    aspirin EC 81 MG tablet Take by mouth.   betamethasone valerate ointment 0.1 % Commonly known as: VALISONE Apply 1 application topically 2 (two) times daily. Apply to foreskin twice daily   Crestor 40 MG tablet Generic drug: rosuvastatin   fluticasone 50 MCG/ACT nasal spray Commonly known as: FLONASE   ibuprofen 400 MG tablet Commonly known as: ADVIL Take 1 tablet (400 mg total) by mouth every 6 (six) hours as needed.   latanoprost 0.005 % ophthalmic solution Commonly known as: XALATAN   linagliptin 5 MG Tabs tablet Commonly known as: TRADJENTA Take 5 mg by mouth daily.   lisinopril 2.5 MG tablet Commonly known as: ZESTRIL   metFORMIN 500 MG 24 hr tablet Commonly known as: GLUCOPHAGE-XR   mirabegron ER 50 MG Tb24 tablet Commonly known as: MYRBETRIQ Take 1 tablet (50 mg total) by mouth daily. Started by: Carman Ching, PA-C   silodosin 8 MG Caps capsule Commonly known as: RAPAFLO Take 1 capsule (8 mg total) by mouth daily with breakfast.   sitaGLIPtin 25 MG tablet Commonly known as: JANUVIA Take 25 mg by mouth daily.  timolol 0.5 % ophthalmic solution Commonly known as: TIMOPTIC        Allergies:  Allergies  Allergen Reactions   Zolpidem     Other reaction(s): Other (See Comments) Confusion    Family History: Family History  Problem Relation Age of Onset   Leukemia Son    Kidney disease Neg Hx    Prostate cancer Neg Hx     Social History:   reports that he has quit smoking. He has never used smokeless tobacco. He reports that he does not drink alcohol and does not use drugs.  Physical Exam: BP 93/63   Pulse 89   Ht 5\' 7"  (1.702  m)   Wt 157 lb (71.2 kg)   BMI 24.59 kg/m   Constitutional:  Alert and oriented, no acute distress, nontoxic appearing HEENT: [place], AT Cardiovascular: No clubbing, cyanosis, or edema Respiratory: Normal respiratory effort, no increased work of breathing Skin: No rashes, bruises or suspicious lesions Neurologic: Grossly intact, no focal deficits, moving all 4 extremities Psychiatric: Normal mood and affect  Laboratory Data: Results for orders placed or performed in visit on 10/31/20  Bladder Scan (Post Void Residual) in office  Result Value Ref Range   SCA Result 29    Assessment & Plan:   1. Benign prostatic hyperplasia with urinary frequency Patient is tolerating silodosin without orthostasis, we will plan to continue this.  We discussed a trial of combination therapy with a beta agonist as opposed to an anticholinergic, as he has not yet tried this combination of pharmacotherapy.  He is in agreement with this plan.  Son requests a longer duration of medication trial at this point, which is reasonable.  We will plan to see him back in clinic in 3 months, sooner if needed.  If his urinary symptoms have not improved on combination silodosin and Myrbetriq, consider PTNS versus urodynamics.  We also discussed finasteride, however his obstructive symptoms are not as bothersome as his storage symptoms so I do not think this will make a significant improvement. - Bladder Scan (Post Void Residual) in office - silodosin (RAPAFLO) 8 MG CAPS capsule; Take 1 capsule (8 mg total) by mouth daily with breakfast.  Dispense: 30 capsule; Refill: 2 - mirabegron ER (MYRBETRIQ) 50 MG TB24 tablet; Take 1 tablet (50 mg total) by mouth daily.  Dispense: 30 tablet; Refill: 2  Return in about 3 months (around 01/30/2021) for Symptom recheck with PVR.  Carman Ching, PA-C  [facility] [address], [address] [place], [place] [address] [phone]

## 2021-01-30 ENCOUNTER — Other Ambulatory Visit: Payer: Self-pay

## 2021-01-30 ENCOUNTER — Ambulatory Visit (INDEPENDENT_AMBULATORY_CARE_PROVIDER_SITE_OTHER): Payer: Medicare Other | Admitting: Physician Assistant

## 2021-01-30 VITALS — BP 129/76 | HR 90 | Ht 70.0 in | Wt 160.0 lb

## 2021-01-30 DIAGNOSIS — R350 Frequency of micturition: Secondary | ICD-10-CM | POA: Diagnosis not present

## 2021-01-30 DIAGNOSIS — N401 Enlarged prostate with lower urinary tract symptoms: Secondary | ICD-10-CM

## 2021-01-30 LAB — BLADDER SCAN AMB NON-IMAGING

## 2021-01-30 NOTE — Patient Instructions (Signed)
Okay to stop silodosin and Myrbetriq. If your urinary symptoms worsen off these medications, please call the office and we will refill them for you.

## 2021-01-30 NOTE — Progress Notes (Signed)
01/30/2021 11:12 AM   Adrian Kim Nov 13, 1940 [id number]  CC: Chief Complaint  Patient presents with   Benign Prostatic Hypertrophy   HPI: Adrian Kim is a 80 y.o. male with PMH diabetes and BPH with obstructive and storage symptoms including urgency, frequency, straining, and intermittency who previously failed tamsulosin due to orthostasis and OAB med monotherapy who presents today for symptom recheck on combination Myrbetriq and silodosin.  He is accompanied today by his son, who assists in interpretation.  Additional interpreter support declined.  Today he reports he has not noticed a difference in his urinary symptoms on Myrbetriq and silodosin compared to combination trospium and silodosin.  He continues to experience daytime frequency every 2 hours and nocturia x1.  He cannot recall if his symptoms are better on dual therapy versus without pharmacotherapy.  PVR 44 mL.  PMH: Past Medical History:  Diagnosis Date   Arthritis    Diabetes (HCC)    Glaucoma    Heart attack (HCC)    HLD (hyperlipidemia)    HTN (hypertension)    Lower back pain    Sleep apnea    Tinnitus     Surgical History: Past Surgical History:  Procedure Laterality Date   CATARACT EXTRACTION W/PHACO Right 11/16/2015   Procedure: CATARACT EXTRACTION PHACO AND INTRAOCULAR LENS PLACEMENT (IOC);  Surgeon: Lockie Mola, MD;  Location: [facility];  Service: Ophthalmology;  Laterality: Right;  DIABETIC - oral meds NEEDS Greek INTERPRETER   COLONOSCOPY WITH PROPOFOL N/A 12/10/2014   Procedure: COLONOSCOPY WITH PROPOFOL;  Surgeon: Wallace Cullens, MD;  Location: [facility] END[facility]COPY;  Service: Gastroenterology;  Laterality: N/A;   CORONARY ANGIOPLASTY WITH STENT PLACEMENT      Home Medications:  Allergies as of 01/30/2021       Reactions   Zolpidem    Other reaction(s): Other (See Comments) Confusion        Medication List        Accurate as of January 30, 2021 11:12 AM. If you have  any questions, ask your nurse or doctor.          STOP taking these medications    mirabegron ER 50 MG Tb24 tablet Commonly known as: MYRBETRIQ Stopped by: Carman Ching, PA-C   silodosin 8 MG Caps capsule Commonly known as: RAPAFLO Stopped by: Carman Ching, PA-C       TAKE these medications    aspirin EC 81 MG tablet Take by mouth.   betamethasone valerate ointment 0.1 % Commonly known as: VALISONE Apply 1 application topically 2 (two) times daily. Apply to foreskin twice daily   Crestor 40 MG tablet Generic drug: rosuvastatin   fluticasone 50 MCG/ACT nasal spray Commonly known as: FLONASE   ibuprofen 400 MG tablet Commonly known as: ADVIL Take 1 tablet (400 mg total) by mouth every 6 (six) hours as needed.   latanoprost 0.005 % ophthalmic solution Commonly known as: XALATAN   linagliptin 5 MG Tabs tablet Commonly known as: TRADJENTA Take 5 mg by mouth daily.   lisinopril 2.5 MG tablet Commonly known as: ZESTRIL   metFORMIN 500 MG 24 hr tablet Commonly known as: GLUCOPHAGE-XR   sitaGLIPtin 25 MG tablet Commonly known as: JANUVIA Take 25 mg by mouth daily.   timolol 0.5 % ophthalmic solution Commonly known as: TIMOPTIC        Allergies:  Allergies  Allergen Reactions   Zolpidem     Other reaction(s): Other (See Comments) Confusion    Family History: Family History  Problem Relation  Age of Onset   Leukemia Son    Kidney disease Neg Hx    Prostate cancer Neg Hx     Social History:   reports that he has quit smoking. He has never used smokeless tobacco. He reports that he does not drink alcohol and does not use drugs.  Physical Exam: BP 129/76   Pulse 90   Ht 5\' 10"  (1.778 m)   Wt 160 lb (72.6 kg)   BMI 22.96 kg/m   Constitutional:  Alert and oriented, no acute distress, nontoxic appearing HEENT: [place], AT Cardiovascular: No clubbing, cyanosis, or edema Respiratory: Normal respiratory effort, no increased work of  breathing Skin: No rashes, bruises or suspicious lesions Neurologic: Grossly intact, no focal deficits, moving all 4 extremities Psychiatric: Normal mood and affect  Laboratory Data Results for orders placed or performed in visit on 01/30/21  Bladder Scan (Post Void Residual) in office  Result Value Ref Range   Scan Result 44mL    Assessment & Plan:   1. Benign prostatic hyperplasia with urinary frequency No additional symptom improvement on combination silodosin and Myrbetriq.  We discussed augmenting his regimen with PTNS or pursuing urodynamics at this time, however patient declines these and states he rather just live with his symptoms as is.  As patient is unsure if pharmacotherapy has helped with his symptoms, we discussed pursuing a drug holiday and patient is in agreement with this plan.  He recently ran out of his Myrbetriq and will stop silodosin and contact our office if his symptoms worsen, at which point okay to restart both of these medications.  Patient states he wishes to follow-up with [place] as needed moving forward. - Bladder Scan (Post Void Residual) in office  Return if symptoms worsen or fail to improve.  Carman Ching, PA-C  [facility] [address], [address] [place], [place] [address] [phone]

## 2021-09-12 IMAGING — CR DG CHEST 2V
2 series · 2 of 2 positions shown · non-contrast
Comparison: None.

CLINICAL DATA: Shortness of breath

EXAM:
CHEST - 2 VIEW

[chest lat]
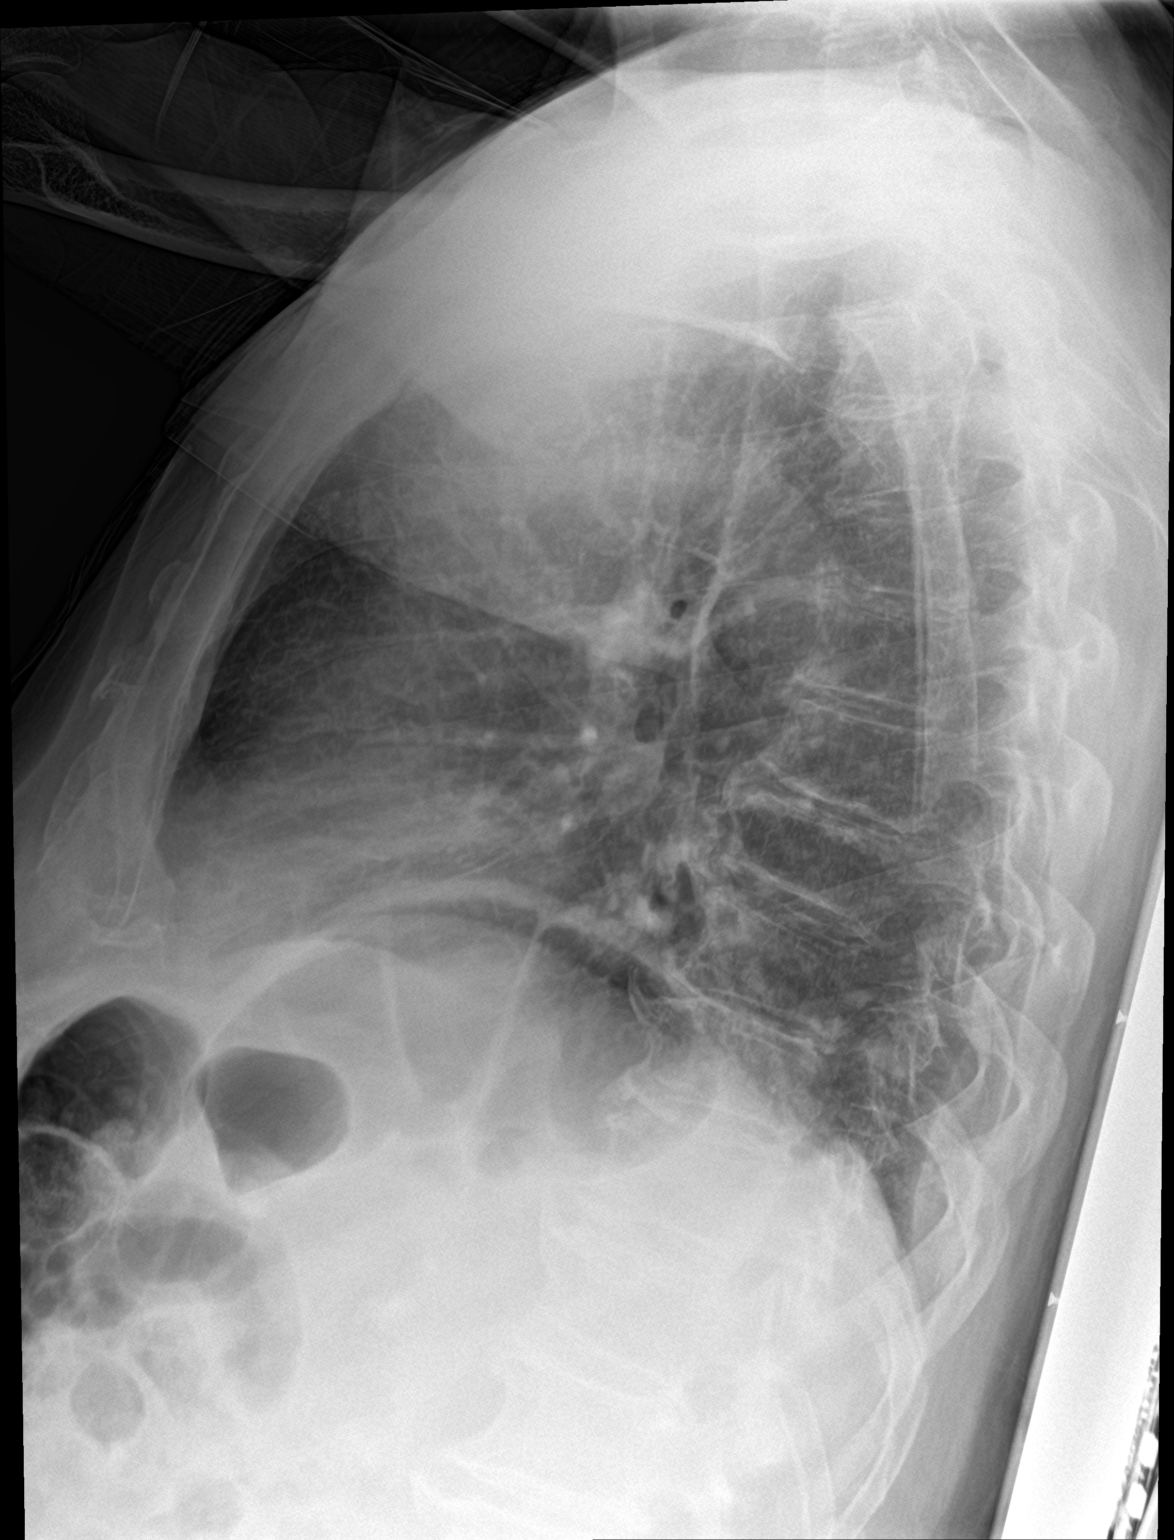

[chest ap]
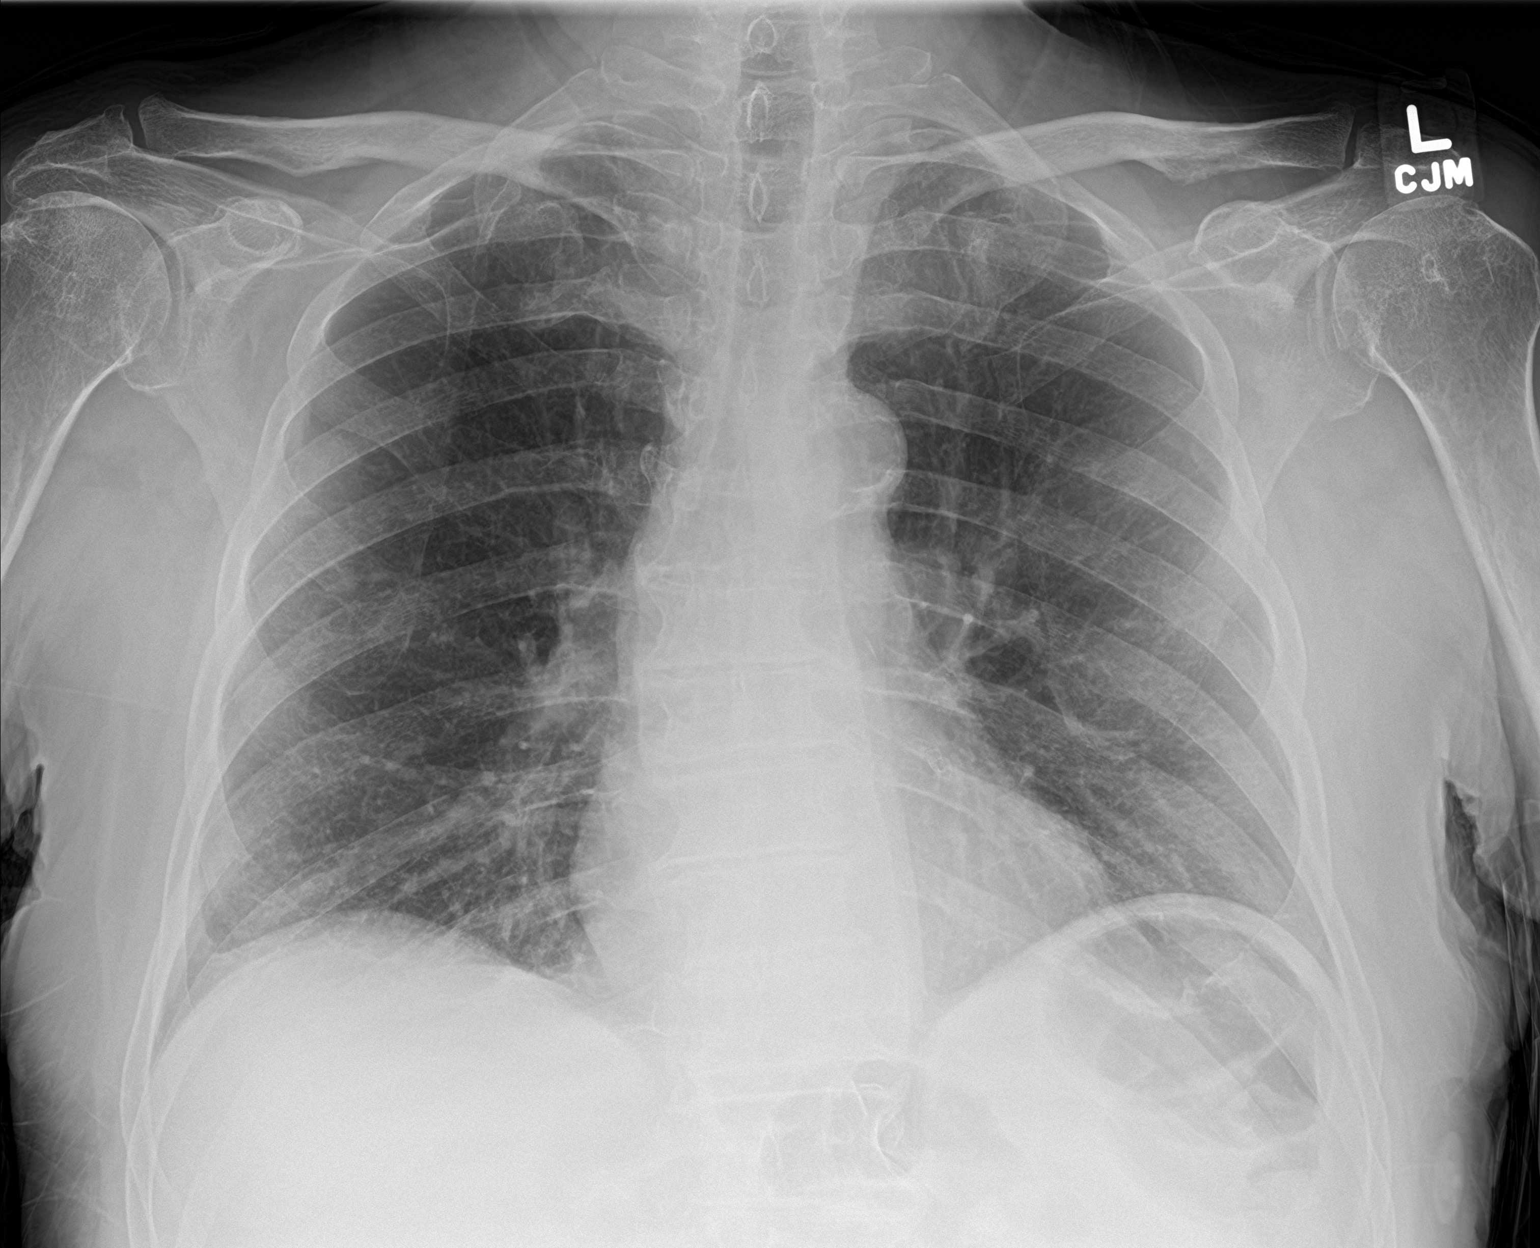

[2 of 2 positions shown; findings below may reference images not displayed]

FINDINGS: The heart size and mediastinal contours are within normal limits.
Aortic knob calcifications are seen. Both lungs are clear. The
visualized skeletal structures are unremarkable.
IMPRESSION: No active cardiopulmonary disease.

## 2021-09-12 IMAGING — CR DG SHOULDER 2+V*R*
3 series · 3 of 3 positions shown · non-contrast
Comparison: None.

CLINICAL DATA: Shortness of breath shoulder pain after fall

EXAM:
RIGHT SHOULDER - 2+ VIEW

[shoulder grashey]
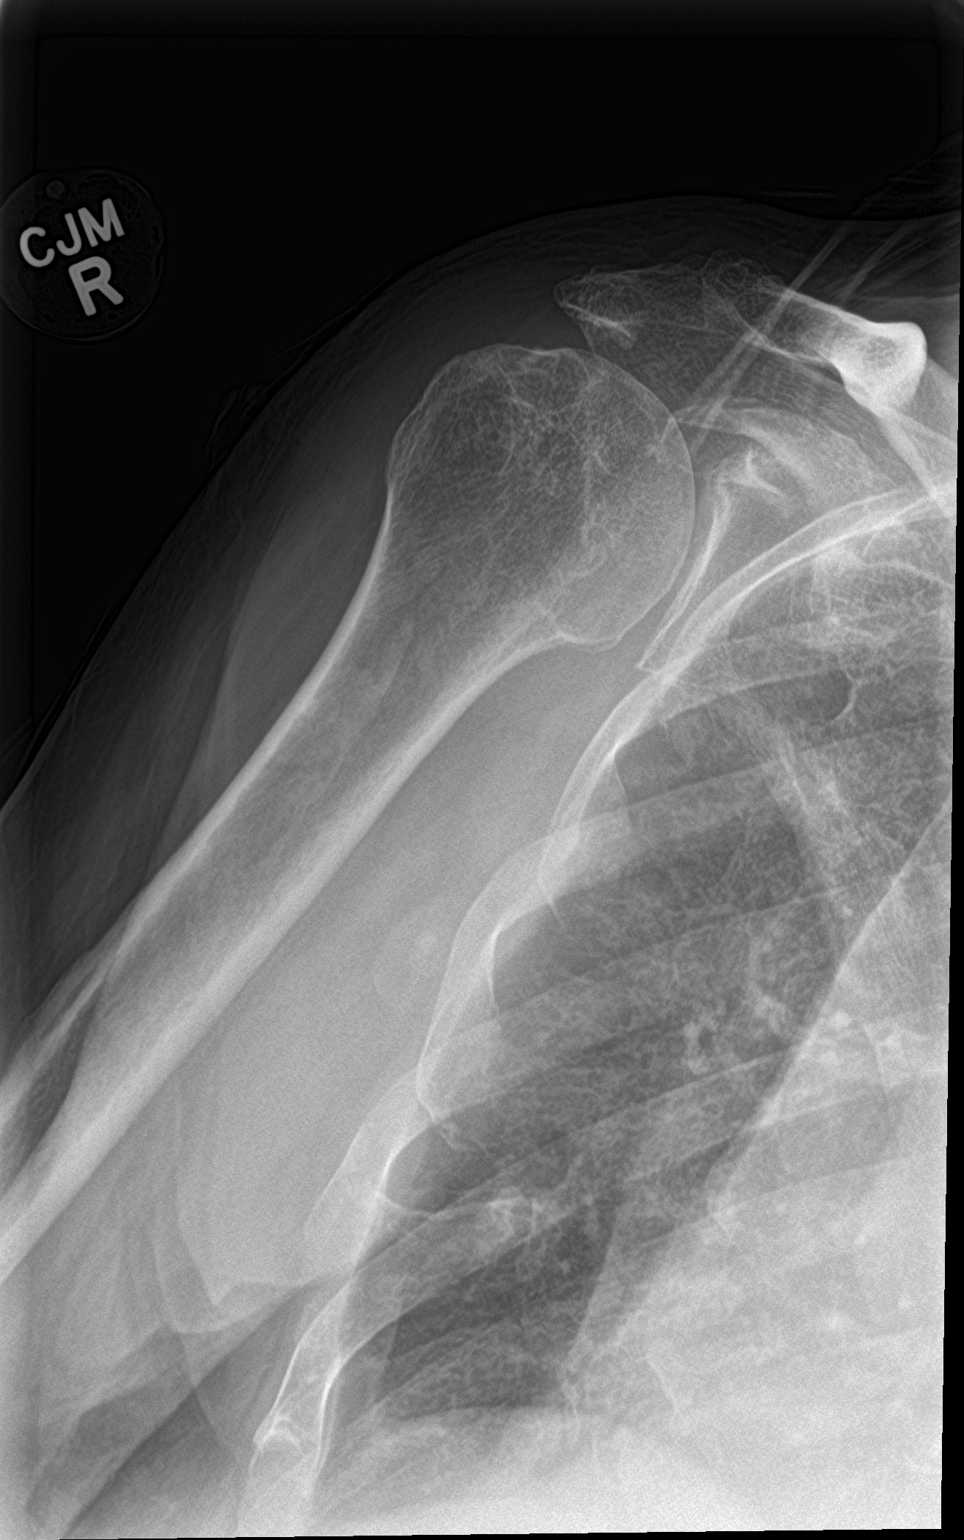

[shoulder y view (1 of 2)]
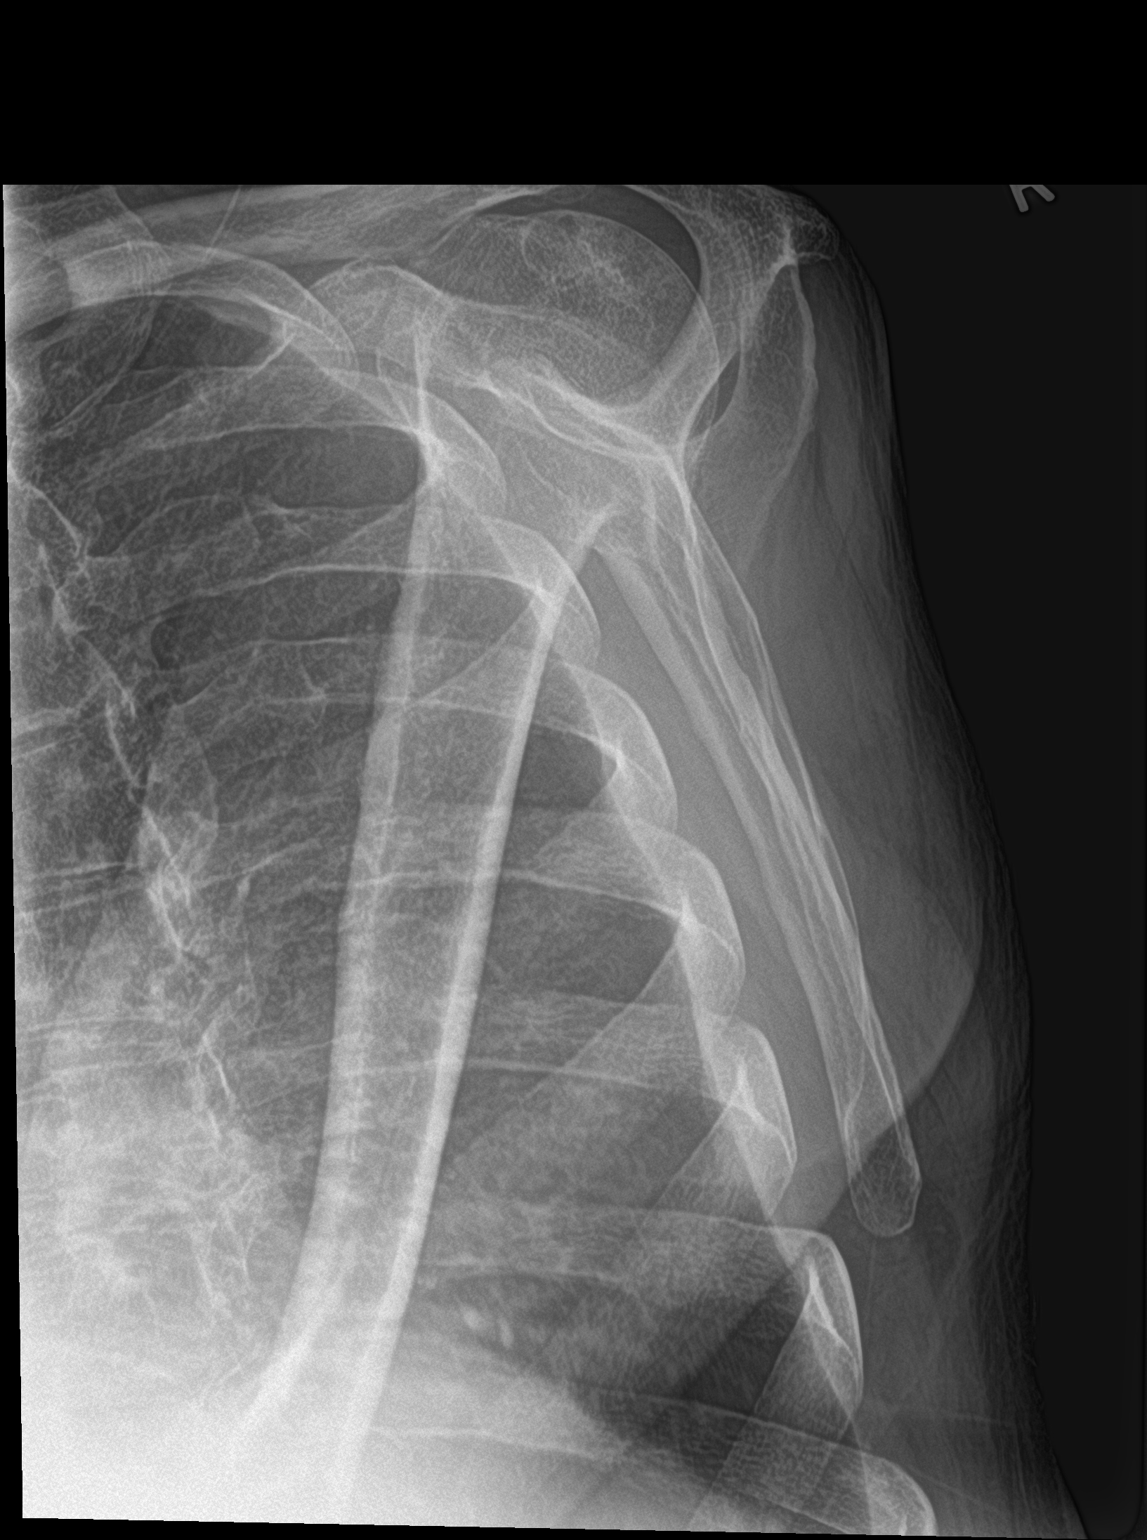

[shoulder y view (2 of 2)]
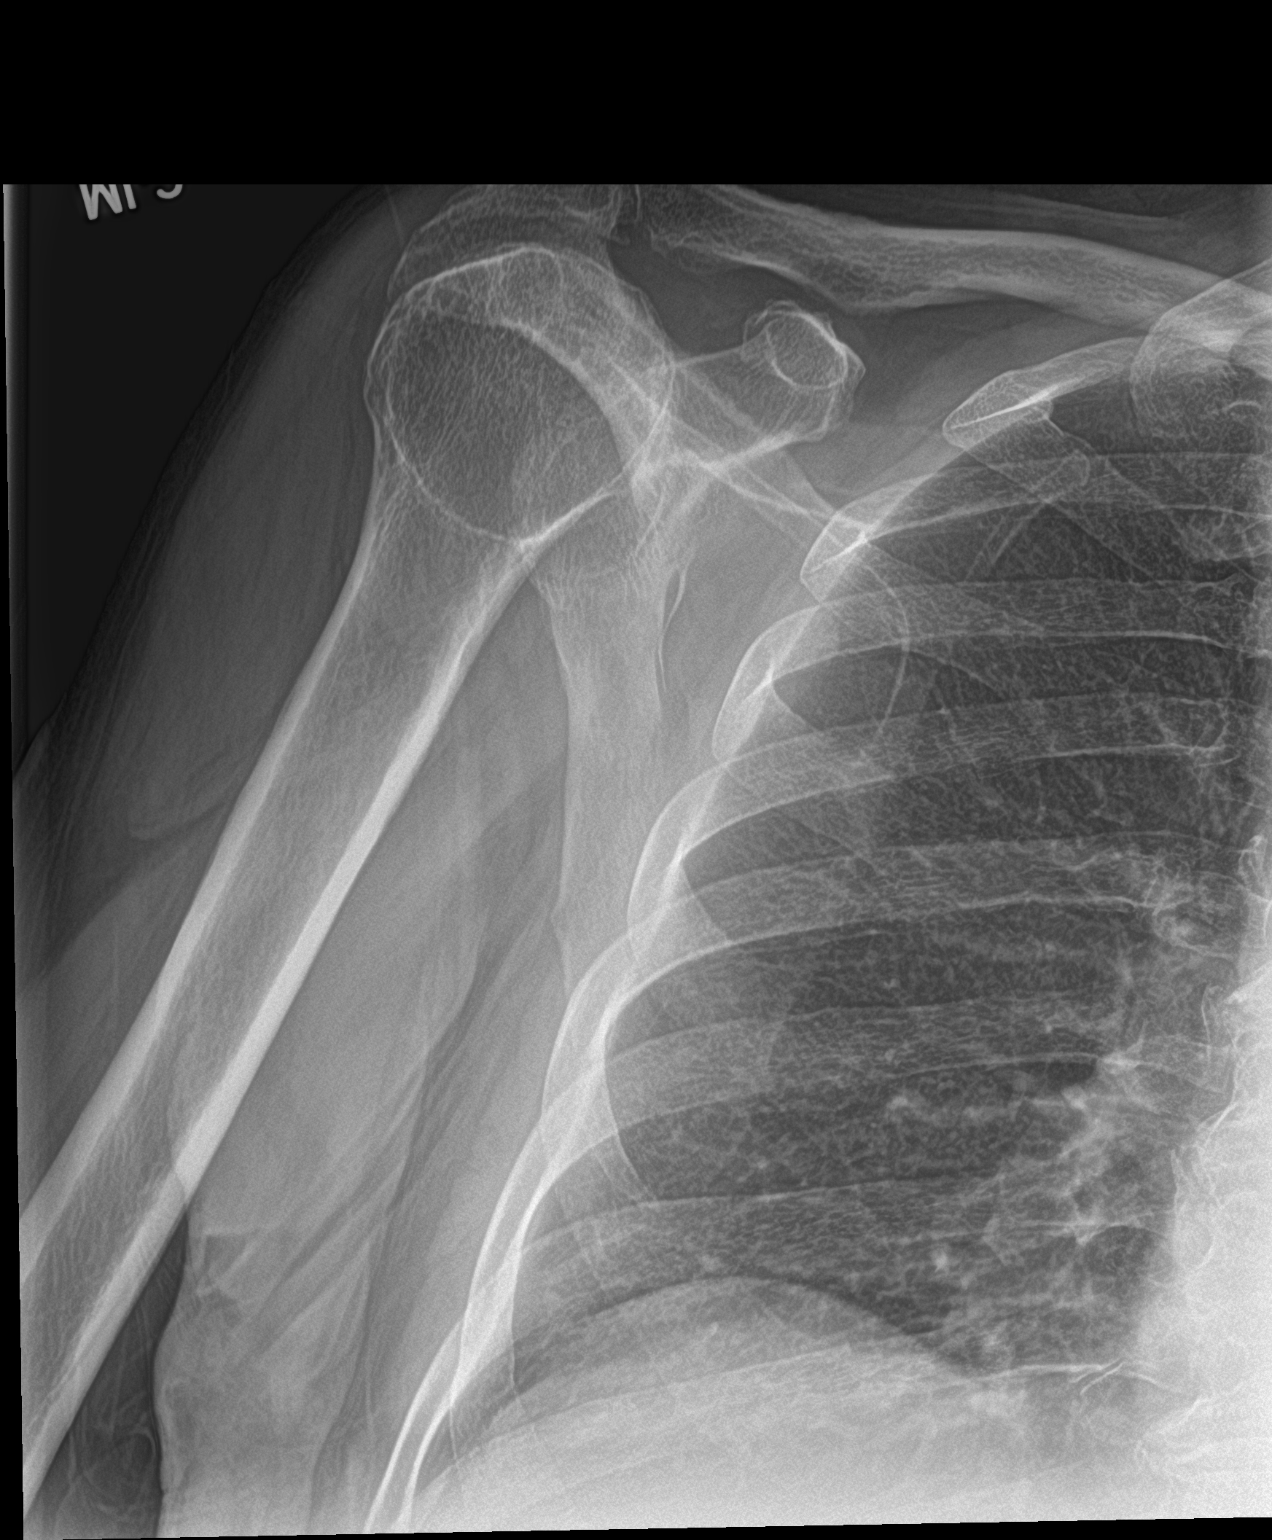

[3 of 3 positions shown; findings below may reference images not displayed]

FINDINGS: There is no evidence of fracture or dislocation. Moderate
glenohumeral joint osteoarthritis is seen with joint space loss.
Mild overlying soft tissue swelling is seen.
IMPRESSION: No acute osseous abnormality.

## 2022-02-16 ENCOUNTER — Other Ambulatory Visit: Payer: Self-pay | Admitting: Family Medicine

## 2022-02-16 DIAGNOSIS — R2689 Other abnormalities of gait and mobility: Secondary | ICD-10-CM

## 2022-03-02 ENCOUNTER — Ambulatory Visit
Admission: RE | Admit: 2022-03-02 | Discharge: 2022-03-02 | Disposition: A | Discharge status: Home / Self Care | Payer: Medicare Other | Source: Ambulatory Visit | Attending: Family Medicine | Admitting: Family Medicine

## 2022-03-02 DIAGNOSIS — R2689 Other abnormalities of gait and mobility: Secondary | ICD-10-CM | POA: Diagnosis present

## 2022-04-05 NOTE — Progress Notes (Signed)
Patient: Adrian Kim  Service Category: E/M  Provider: Oswaldo Done, MD  DOB: 1940/06/11  DOS: 04/09/2022  Referring Provider: Leanna Sato, MD  MRN: [id number]  Setting: Ambulatory outpatient  PCP: [facility]  Type: New Patient  Specialty: Interventional Pain Management    Location: Office  Delivery: Face-to-face     Primary Reason(s) for Visit: Encounter for initial evaluation of one or more chronic problems (new to examiner) potentially causing chronic pain, and posing a threat to normal musculoskeletal function. (Level of risk: High) CC: Back Pain (lower)  HPI  Adrian Kim is a 82 y.o. year old, male patient, who comes for the first time to our practice referred by Leanna Sato, MD for our initial evaluation of his chronic pain. 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 Today he comes in for evaluation of his Back Pain (lower)  Pain Assessment: Location:   Back Radiating: lower Onset: More than a month ago Duration: Chronic pain Quality: Aching, Burning, Constant, Stabbing, Sharp Severity:  3 /10 (subjective, self-reported pain score)  Effect on ADL: limits my daily activities Timing: Constant Modifying factors: laying down and rest BP: (!) 89/63  HR: 72  Onset and Duration: Sudden and Present longer than 3 months Cause of pain: Trauma Severity: Getting worse, NAS-11 at its worse: 10/10, NAS-11 at its best: 3/10, NAS-11 now: 3/10, and NAS-11 on the average: 3/10 Timing: Not influenced by the time of the day Aggravating Factors: Bending, Lifiting, Prolonged standing, Walking, and Walking uphill Alleviating Factors: Lying down and Resting Associated Problems: Fatigue, Swelling, Pain that wakes patient up, and Pain that does not allow patient to sleep Quality of Pain: Aching, Nagging, Sharp, Shooting, and Throbbing Previous Examinations or Tests: MRI scan and X-rays Previous Treatments: Epidural steroid injections and Physical Therapy  Adrian Kim is being evaluated for possible interventional pain management therapies for the treatment of his chronic pain.   The patient comes into the clinic today for the first time accompanied by his daughter in law who is of Hispanic origin.  The patient is [place].  According to the patient's daughter-in-law, the patient suffered a fall while in his basement.  He ended up hitting his head on the left side of his face, his ribs, but apparently he did not go to the ED for an evaluation.  The patient was sent to [place] for a chronic pain management evaluation.  He comes in in a wheelchair and according to his daughter-in-law he also uses a brace in the area of his chest for the ribs and rib pain.  However, trying to communicate with this patient today was very difficult since he continuously contradicted himself.  He would tell me that he was having pain in an area of his body and then when I asked him to give  me more details then he would say that he did not have any pain.  After a while, it was clear to me that this patient is having some type of  cognitive impairment in addition to the fact that there is a language barrier.  He seems to understand [place] and answers questions as if he would understand the question, but he would then continuously contradict his answers.  Seen this difficulty, I turn to his daughter-in-law who indicated that he apparently had this fall and after the fall he has been having a lot of problems with communication, not only here but at home.  She indicates that he has complained of bilateral hand pain, bilateral rib pain, neck pain, low back pain, bilateral lower extremity pain, bilateral hip pain, bilateral knee pain, and pain on both of his feet.  He has a history of non-insulin-dependent diabetes mellitus and the family indicates that after this fall that he suffered, the dynamics at home has changed.  Apparently he is extremely frustrated because he is now unable to do many things which apparently he used to do in the past.  She describes that he used to be able to take care of his home and garden, but now he is unable to do that and apparently his wife resents him for this.  His son, who is fluent in [place], apparently works in [employer] and currently has a job in [place] and was unable to attend this appointment.  This patient also have multiple medical problems involving his heart and according to her, after he had a right lower extremity catheterization for a treatment for his heart, he has had problems with his right lower extremity.  Today I will be ordering lab work as well as some x-rays in order to cover the areas that today he has complained about.  Adrian Kim has been informed that this initial visit was an evaluation only.  On the follow up appointment I will go over the results, including ordered tests and available interventional therapies. At that time he will have the opportunity to decide whether to proceed with offered therapies or not. In the event that Adrian Kim prefers avoiding interventional  options, this will conclude our involvement in the case.  Medication management recommendations may be provided upon request.  Historic Controlled Substance Pharmacotherapy Review  PMP and historical list of controlled substances: None Most recently prescribed opioid analgesics:   None MME/day: 0 mg/day  Historical Monitoring: The patient  reports no history of drug use. List of prior UDS Testing: No results found for: "MDMA", "COCAINSCRNUR", "PCPSCRNUR", "PCPQUANT", "CANNABQUANT", "THCU", "ETH", "CBDTHCR", "D8THCCBX", "D9THCCBX" Historical Background Evaluation: [place] PMP: PDMP reviewed during this encounter. Review of the past 25-months conducted.             PMP NARX Score Report:  Narcotic: 000 Sedative: 000 Stimulant: 000 [place] Department of public safety, offender search: Engineer, mining Information) Non-contributory Risk Assessment Profile: Aberrant behavior: None observed or detected today Risk factors for fatal opioid overdose: None identified today PMP NARX Overdose Risk Score: 000 Fatal overdose hazard ratio (HR): Calculation deferred Non-fatal overdose hazard ratio (HR): Calculation deferred Risk of opioid abuse or dependence: 0.7-3.0% with doses ? 36 MME/day and 6.1-26% with doses ? 120 MME/day. Substance use disorder (SUD) risk level: See below Personal History of Substance Abuse (SUD-Substance use disorder):  Alcohol: Negative  Illegal Drugs: Negative  Rx Drugs: Negative  ORT Risk Level calculation: Low Risk  Opioid Risk Tool - 04/09/22 1144  Family History of Substance Abuse   Alcohol Negative    Illegal Drugs Negative    Rx Drugs Negative      Personal History of Substance Abuse   Alcohol Negative    Illegal Drugs Negative    Rx Drugs Negative      Age   Age between 44-45 years  No      History of Preadolescent Sexual Abuse   History of Preadolescent Sexual Abuse Negative or Male      Psychological Disease   Psychological Disease Negative    Depression  Positive      Total Score   Opioid Risk Tool Scoring 1    Opioid Risk Interpretation Low Risk            ORT Scoring interpretation table:  Score <3 = Low Risk for SUD  Score between 4-7 = Moderate Risk for SUD  Score >8 = High Risk for Opioid Abuse   PHQ-2 Depression Scale:  Total score:    PHQ-2 Scoring interpretation table: (Score and probability of major depressive disorder)  Score 0 = No depression  Score 1 = 15.4% Probability  Score 2 = 21.1% Probability  Score 3 = 38.4% Probability  Score 4 = 45.5% Probability  Score 5 = 56.4% Probability  Score 6 = 78.6% Probability   PHQ-9 Depression Scale:  Total score:    PHQ-9 Scoring interpretation table:  Score 0-4 = No depression  Score 5-9 = Mild depression  Score 10-14 = Moderate depression  Score 15-19 = Moderately severe depression  Score 20-27 = Severe depression (2.4 times higher risk of SUD and 2.89 times higher risk of overuse)   Pharmacologic Plan: As per protocol, I have not taken over any controlled substance management, pending the results of ordered tests and/or consults.            Initial impression: Pending review of available data and ordered tests.  Meds   Current Outpatient Medications:    acetaminophen (TYLENOL) 325 MG tablet, Take 650 mg by mouth every 6 (six) hours as needed., Disp: , Rfl:    aspirin EC 81 MG tablet, Take by mouth., Disp: , Rfl:    betamethasone valerate ointment (VALISONE) 0.1 %, Apply 1 application topically 2 (two) times daily. Apply to foreskin twice daily, Disp: 30 g, Rfl: 0   cetirizine (ZYRTEC) 5 MG chewable tablet, Chew 5 mg by mouth daily., Disp: , Rfl:    dorzolamide-timolol (COSOPT) 2-0.5 % ophthalmic solution, 1 drop 2 (two) times daily., Disp: , Rfl:    fluticasone (FLONASE) 50 MCG/ACT nasal spray, , Disp: , Rfl:    glucose blood test strip, , Disp: , Rfl:    ibuprofen (ADVIL) 400 MG tablet, Take 1 tablet (400 mg total) by mouth every 6 (six) hours as needed., Disp: 30  tablet, Rfl: 0   INVOKANA 300 MG TABS tablet, Take 300 mg by mouth daily., Disp: , Rfl:    latanoprost (XALATAN) 0.005 % ophthalmic solution, , Disp: , Rfl:    linagliptin (TRADJENTA) 5 MG TABS tablet, Take 5 mg by mouth daily., Disp: , Rfl:    lisinopril (PRINIVIL,ZESTRIL) 2.5 MG tablet, , Disp: , Rfl:    metFORMIN (GLUCOPHAGE-XR) 500 MG 24 hr tablet, , Disp: , Rfl:    rosuvastatin (CRESTOR) 20 MG tablet, Take 20 mg by mouth at bedtime., Disp: , Rfl:    RYBELSUS 7 MG TABS, Take 1 tablet by mouth daily., Disp: , Rfl:    sitaGLIPtin (JANUVIA) 25  MG tablet, Take 25 mg by mouth daily., Disp: , Rfl:    tamsulosin (FLOMAX) 0.4 MG CAPS capsule, Take 0.4 mg by mouth., Disp: , Rfl:    timolol (TIMOPTIC) 0.5 % ophthalmic solution, , Disp: , Rfl:    TRUEplus Lancets 28G MISC, , Disp: , Rfl:   Imaging Review  Shoulder Imaging: Shoulder-R DG: Results for orders placed during the hospital encounter of 04/19/20 DG Shoulder Right  Narrative CLINICAL DATA:  Shortness of breath shoulder pain after fall  EXAM: RIGHT SHOULDER - 2+ VIEW  COMPARISON:  None.  FINDINGS: There is no evidence of fracture or dislocation. 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 Mild overlying soft tissue swelling is seen.  IMPRESSION: No acute osseous abnormality.  Electronically Signed By: Jonna Clark M.D. On: 04/19/2020 18:34  Complexity Note: Imaging results reviewed.                         ROS  Cardiovascular: Daily Aspirin intake and Heart surgery Pulmonary or Respiratory: Snoring  Neurological: No reported neurological signs or symptoms such as seizures, abnormal skin sensations, urinary and/or fecal incontinence, being born with an abnormal open spine and/or a tethered spinal cord Psychological-Psychiatric: Depressed Gastrointestinal: No reported gastrointestinal signs or symptoms such as vomiting or evacuating blood, reflux, heartburn, alternating episodes of diarrhea  and constipation, inflamed or scarred liver, or pancreas or irrregular and/or infrequent bowel movements Genitourinary: Recurrent Urinary Tract infections Hematological: No reported hematological signs or symptoms such as prolonged bleeding, low or poor functioning platelets, bruising or bleeding easily, hereditary bleeding problems, low energy levels due to low hemoglobin or being anemic Endocrine: High blood sugar requiring insulin (IDDM) Rheumatologic: Rheumatoid arthritis Musculoskeletal: Negative for myasthenia gravis, muscular dystrophy, multiple sclerosis or malignant hyperthermia Work History: Retired  Allergies  Adrian Kim is allergic to zolpidem.  Laboratory Chemistry Profile   Renal Lab Results  Component Value Date   BUN 14 04/19/2020   CREATININE 0.75 04/19/2020   GFRNONAA >60 04/19/2020   SPECGRAV 1.010 06/20/2020   PHUR 5.0 06/20/2020   PROTEINUR Negative 06/20/2020     Electrolytes Lab Results  Component Value Date   NA 134 (L) 04/19/2020   K 4.0 04/19/2020   CL 102 04/19/2020   CALCIUM 9.6 04/19/2020     Hepatic No results found for: "AST", "ALT", "ALBUMIN", "ALKPHOS", "AMYLASE", "LIPASE", "AMMONIA"   ID No results found for: "LYMEIGGIGMAB", "HIV", "SARSCOV2NAA", "STAPHAUREUS", "MRSAPCR", "HCVAB", "PREGTESTUR", "RMSFIGG", "QFVRPH1IGG", "QFVRPH2IGG"   Bone No results found for: "VD25OH", "VD125OH2TOT", "DG3875IE3", "PI9518AC1", "25OHVITD1", "25OHVITD2", "25OHVITD3", "TESTOFREE", "TESTOSTERONE"   Endocrine Lab Results  Component Value Date   GLUCOSE 298 (H) 04/19/2020   GLUCOSEU 3+ (A) 06/20/2020     Neuropathy No results found for: "VITAMINB12", "FOLATE", "HGBA1C", "HIV"   CNS No results found for: "COLORCSF", "APPEARCSF", "RBCCOUNTCSF", "WBCCSF", "POLYSCSF", "LYMPHSCSF", "EOSCSF", "PROTEINCSF", "GLUCCSF", "JCVIRUS", "CSFOLI", "IGGCSF", "LABACHR", "ACETBL"   Inflammation (CRP: Acute  ESR: Chronic) No results found for: "CRP", "ESRSEDRATE",  "LATICACIDVEN"   Rheumatology No results found for: "RF", "ANA", "LABURIC", "URICUR", "LYMEIGGIGMAB", "LYMEABIGMQN", "HLAB27"   Coagulation Lab Results  Component Value Date   PLT 171 04/19/2020     Cardiovascular Lab Results  Component Value Date   HGB 15.8 04/19/2020   HCT 46.3 04/19/2020     Screening No results found for: "SARSCOV2NAA", "COVIDSOURCE", "STAPHAUREUS", "MRSAPCR", "HCVAB", "HIV", "PREGTESTUR"   Cancer No results found for: "CEA", "CA125", "LABCA2"   Allergens No results found for: "ALMOND", "  APPLE", "ASPARAGUS", "AVOCADO", "BANANA", "BARLEY", "BASIL", "BAYLEAF", "GREENBEAN", "LIMABEAN", "WHITEBEAN", "BEEFIGE", "REDBEET", "BLUEBERRY", "BROCCOLI", "CABBAGE", "MELON", "CARROT", "CASEIN", "CASHEWNUT", "CAULIFLOWER", "CELERY"     Note: Lab results reviewed.  PFSH  Drug: Adrian Kim  reports no history of drug use. Alcohol:  reports no history of alcohol use. Tobacco:  reports that he has quit smoking. He has never used smokeless tobacco. Medical:  has a past medical history of Arthritis, Diabetes (HCC), Glaucoma, Heart attack (HCC), HLD (hyperlipidemia), HTN (hypertension), Lower back pain, Sleep apnea, and Tinnitus. Family: family history includes Leukemia in his son.  Past Surgical History:  Procedure Laterality Date   CATARACT EXTRACTION W/PHACO Right 11/16/2015   Procedure: CATARACT EXTRACTION PHACO AND INTRAOCULAR LENS PLACEMENT (IOC);  Surgeon: Lockie Mola, MD;  Location: [facility];  Service: Ophthalmology;  Laterality: Right;  DIABETIC - oral meds NEEDS Greek INTERPRETER   COLONOSCOPY WITH PROPOFOL N/A 12/10/2014   Procedure: COLONOSCOPY WITH PROPOFOL;  Surgeon: Wallace Cullens, MD;  Location: [facility];  Service: Gastroenterology;  Laterality: N/A;   CORONARY ANGIOPLASTY WITH STENT PLACEMENT     Active Ambulatory Problems    Diagnosis Date Noted   Acute cystitis with hematuria 09/22/2014   BPH with obstruction/lower urinary tract  symptoms 09/22/2014   Benign essential HTN 11/03/2013   Bilateral carotid artery stenosis 05/11/2014   Coronary atherosclerosis 11/03/2013   Mild aortic valve stenosis 01/23/2018   Mixed hyperlipidemia 11/03/2013   Moderate mitral insufficiency 05/11/2014   Moderate tricuspid insufficiency 11/17/2014   Myocardial infarction (HCC) 06/07/2020   Chronic pain syndrome 04/09/2022   Pharmacologic therapy 04/09/2022   Disorder of skeletal system 04/09/2022   Problems influencing health status 04/09/2022   Non-insulin dependent type 2 diabetes mellitus (HCC) 04/09/2022   Chronic hand pain (Bilateral) 04/09/2022   Chronic knee pain (Bilateral) 04/09/2022   Chronic hip pain (Bilateral) 04/09/2022   Chronic neck pain 04/09/2022   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 04/09/2022   Chronic lower extremity pain (Bilateral) 04/09/2022   Trigger ring finger of left hand 04/09/2022   Chronic feet pain (Bilateral) 04/09/2022   Rib pain (Bilateral) 04/09/2022   Mild cognitive impairment 04/09/2022   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 In no apparent acute distress Vitals:   04/09/22 1131  BP: (!) 89/63  Pulse: 72  Temp: (!) 97.3 F (36.3 C)  SpO2: 99%  Weight: 160 lb (72.6 kg)  Height: 6' (1.829 m)   BMI Assessment: Estimated body mass index is 21.7 kg/m as calculated from the following:   Height as of this encounter: 6' (1.829 m).   Weight as of this encounter: 160 lb (72.6 kg).  BMI interpretation  table: BMI level Category Range association with higher incidence of chronic pain  <18 kg/m2 Underweight   18.5-24.9 kg/m2 Ideal body weight   25-29.9 kg/m2 Overweight Increased incidence by 20%  30-34.9 kg/m2 Obese (Class I) Increased incidence by 68%  35-39.9  kg/m2 Severe obesity (Class II) Increased incidence by 136%  >40 kg/m2 Extreme obesity (Class III) Increased incidence by 254%   Patient's current BMI Ideal Body weight  Body mass index is 21.7 kg/m. Ideal body weight: 77.6 kg (171 lb 1.2 oz)   BMI Readings from Last 4 Encounters:  04/09/22 21.70 kg/m  01/30/21 22.96 kg/m  10/31/20 24.59 kg/m  09/12/20 25.99 kg/m   Wt Readings from Last 4 Encounters:  04/09/22 160 lb (72.6 kg)  01/30/21 160 lb (72.6 kg)  10/31/20 157 lb (71.2 kg)  09/12/20 176 lb (79.8 kg)    Psych/Mental status: Alert, oriented x 3 (person, place, & time)       Eyes: PERLA Respiratory: No evidence of acute respiratory distress  Assessment  Primary Diagnosis & Pertinent Problem List: The primary encounter diagnosis was Chronic pain syndrome. 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  Visit Diagnosis (New problems to examiner): 1. Chronic pain syndrome   2. Chronic hand pain (Bilateral)   3. Chronic knee pain (Bilateral)   4. Chronic hip pain (Bilateral)   5. Chronic neck pain   6. Chronic  low back pain (Bilateral) w/o sciatica   7. Chronic lower extremity pain (Bilateral)   8. Non-insulin dependent type 2 diabetes mellitus (HCC)   9. Trigger ring finger of left hand   10. Chronic feet pain (Bilateral)   11. Rib pain (Bilateral)   12. Pharmacologic therapy   13. Disorder of skeletal system   14. Problems influencing health status   15. Diabetes mellitus type 2, insulin dependent (HCC)   16. Mild cognitive impairment   17. 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 We have not taken over the patient's controlled substance management.  Problem-specific plan: No problem-specific Assessment & Plan notes found for this encounter.  Lab Orders         Compliance Drug Analysis, Ur         Comp. Metabolic Panel (12)         Magnesium         Vitamin B12         Sedimentation rate         25-Hydroxy vitamin D Lcms D2+D3         C-reactive protein         Hemoglobin A1c     Imaging Orders         DG Cervical Spine With Flex & Extend         DG Lumbar Spine Complete W/Bend         DG Hand Complete Left         DG Hand Complete Right         DG HIP UNILAT W OR W/O PELVIS 2-3 VIEWS  RIGHT         DG HIP UNILAT W OR W/O PELVIS 2-3 VIEWS LEFT         DG Knee Complete 4 Views Right         DG Knee Complete 4 Views Left         DG Foot Complete Right         DG Foot Complete Left         DG Ribs Bilateral     Referral Orders  No referral(s) requested today   Procedure Orders    No procedure(s) ordered today   Pharmacotherapy (current): Medications ordered:  No orders of the defined types were placed in this encounter.  Medications administered during this visit: Adrian Kim had no medications administered during this visit.   Analgesic Pharmacotherapy:  Opioid  Analgesics: For patients currently taking or requesting to take opioid analgesics, in accordance with [facility] Guidelines, we will assess their risks and indications for the use of these substances. After completing our evaluation, we may offer recommendations, but we no longer take patients for medication management. The prescribing physician will ultimately decide, based on his/her training and level of comfort whether to adopt any of the recommendations, including whether or not to prescribe such medicines.  Membrane stabilizer: To be determined at a later time  Muscle relaxant: To be determined at a later time  NSAID: To be determined at a later time  Other analgesic(s): To be determined at a later time   Interventional management options: Adrian Kim was informed that there is no guarantee that he would be a candidate for interventional therapies. The decision will be based on the results of diagnostic studies, as well as Mr. Janney's risk profile.  Procedure(s) under consideration:  Pending results of ordered studies      Interventional Therapies  Risk Factors  Considerations:  IDDM  CAD  Hx. MI  (B) Carotis A. Stenosis  HTN  AoV stenosis  Mitral & Tricuspid V. insufficiency  OSA   Planned  Pending:   See above for possible orders   Under consideration:   Pending completion of evaluation   Completed:   None at this time   Completed by other providers:   None at this time   Therapeutic  Palliative (PRN) options:   None established      Provider-requested follow-up: Return in about 3 weeks (around 04/30/2022) for ( ), Eval-day (M,W), (F2F), 2nd Visit, for review of ordered tests.  Future Appointments  Date Time Provider Department Center  05/07/2022 11:00 AM Delano Metz, MD [facility] None     Duration of encounter: 50 minutes.  Total time on encounter, as per AMA guidelines included both the face-to-face and non-face-to-face time  personally spent by the physician and/or other qualified health care professional(s) on the day of the encounter (includes time in activities that require the physician or other qualified health care professional and does not include time in activities normally performed by clinical staff). Physician's time may include the following activities when performed: Preparing to see the patient (e.g., pre-charting review of records, searching for previously ordered imaging, lab work, and nerve conduction tests) Review of prior analgesic pharmacotherapies. Reviewing PMP Interpreting ordered tests (e.g., lab work, imaging, nerve conduction tests) Performing post-procedure evaluations, including interpretation of diagnostic procedures Obtaining and/or reviewing separately obtained history Performing a medically appropriate examination and/or evaluation Counseling and educating the patient/family/caregiver Ordering medications, tests, or procedures Referring and communicating with other health  care professionals (when not separately reported) Documenting clinical information in the electronic or other health record Independently interpreting results (not separately reported) and communicating results to the patient/ family/caregiver Care coordination (not separately reported)  Note by: Oswaldo Done, MD (TTS technology used. I apologize for any typographical errors that were not detected and corrected.) Date: 04/09/2022; Time: 12:43 PM

## 2022-04-08 NOTE — Patient Instructions (Signed)
____________________________________________________________________________________________  New Patients  Welcome to [facility].   Initial Visit The first or initial visit consists of an evaluation only.   Interventional pain management.  We offer therapies other than opioid controlled substances to manage chronic pain. These include, but are not limited to, diagnostic, therapeutic, and palliative specialized injection therapies (i.e.: Epidural Steroids, Facet Blocks, etc.). We specialize in a variety of nerve blocks as well as radiofrequency treatments. We offer pain implant evaluations and trials, as well as follow up management. In addition we also provide a variety joint injections, including Viscosupplementation (AKA: Gel Therapy).  Prescription Pain Medication We provide evaluations for/of pharmacologic therapies. Recommendations will follow CDC Guidelines.  We no longer take patients for long-term medication management. We will not be taking over your pain medications.  ____________________________________________________________________________________________    ____________________________________________________________________________________________  Patient Information update  To: All of our patients.  Re: Name change.  It has been made official that our current name, "[facility]"   will soon be changed to "[facility]".   The purpose of this change is to eliminate any confusion created by the concept of our practice being a "Medication Management Pain Clinic". In the past this has led to the misconception that we treat pain primarily by the use of prescription medications.  Nothing can be farther from the truth.   Understanding PAIN MANAGEMENT: To further understand what our practice does, you  first have to understand that "Pain Management" is a subspecialty that requires additional training once a physician has completed their specialty training, which can be in either Anesthesia, Neurology, Psychiatry, or Physical Medicine and Rehabilitation (PMR). Each one of these contributes to the final approach taken by each physician to the management of their patient's pain. To be a "Pain Management Specialist" you must have first completed one of the specialty trainings below.  Anesthesiologists - trained in clinical pharmacology and interventional techniques such as nerve blockade and regional as well as central neuroanatomy. They are trained to block pain before, during, and after surgical interventions.  Neurologists - trained in the diagnosis and pharmacological treatment of complex neurological conditions, such as Multiple Sclerosis, Parkinson's, spinal cord injuries, and other systemic conditions that may be associated with symptoms that may include but are not limited to pain. They tend to rely primarily on the treatment of chronic pain using prescription medications.  Psychiatrist - trained in conditions affecting the psychosocial wellbeing of patients including but not limited to depression, anxiety, schizophrenia, personality disorders, addiction, and other substance use disorders that may be associated with chronic pain. They tend to rely primarily on the treatment of chronic pain using prescription medications.   Physical Medicine and Rehabilitation (PMR) physicians, also known as physiatrists - trained to treat a wide variety of medical conditions affecting the brain, spinal cord, nerves, bones, joints, ligaments, muscles, and tendons. Their training is primarily aimed at treating patients that have suffered injuries that have caused severe physical impairment. Their training is primarily aimed at the physical therapy and rehabilitation of those patients. They may also work alongside  orthopedic surgeons or neurosurgeons using their expertise in assisting surgical patients to recover after their surgeries.  INTERVENTIONAL PAIN MANAGEMENT is sub-subspecialty of Pain Management.  Our physicians are Board-certified in Anesthesia, Pain Management, and Interventional Pain Management.  This meaning that not only have they been trained and Board-certified in their specialty of Anesthesia, and   subspecialty of Pain Management, but they have also received further training in the sub-subspecialty of Interventional Pain Management, in order to become Board-certified as INTERVENTIONAL PAIN MANAGEMENT SPECIALIST.    Mission: Our goal is to use our skills in  INTERVENTIONAL PAIN MANAGEMENT as alternatives to the chronic use of prescription opioid medications for the treatment of pain. To make this more clear, we have changed our name to reflect what we do and offer. We will continue to offer medication management assessment and recommendations, but we will not be taking over any patient's medication management.  ____________________________________________________________________________________________     

## 2022-04-09 ENCOUNTER — Other Ambulatory Visit
Admission: RE | Admit: 2022-04-09 | Discharge: 2022-04-09 | Disposition: A | Discharge status: Home / Self Care | Payer: Medicare Other | Source: Ambulatory Visit | Attending: Pain Medicine | Admitting: Pain Medicine

## 2022-04-09 ENCOUNTER — Other Ambulatory Visit: Payer: Self-pay | Admitting: Pain Medicine

## 2022-04-09 ENCOUNTER — Ambulatory Visit (HOSPITAL_BASED_OUTPATIENT_CLINIC_OR_DEPARTMENT_OTHER): Payer: Medicare Other | Admitting: Pain Medicine

## 2022-04-09 ENCOUNTER — Encounter: Payer: Self-pay | Admitting: Pain Medicine

## 2022-04-09 ENCOUNTER — Ambulatory Visit
Admission: RE | Admit: 2022-04-09 | Discharge: 2022-04-09 | Disposition: A | Discharge status: Home / Self Care | Payer: Medicare Other | Source: Ambulatory Visit | Attending: Pain Medicine | Admitting: Pain Medicine

## 2022-04-09 VITALS — BP 89/63 | HR 72 | Temp 97.3°F | Ht 72.0 in | Wt 160.0 lb

## 2022-04-09 DIAGNOSIS — M79641 Pain in right hand: Secondary | ICD-10-CM | POA: Insufficient documentation

## 2022-04-09 DIAGNOSIS — M25561 Pain in right knee: Secondary | ICD-10-CM

## 2022-04-09 DIAGNOSIS — M79672 Pain in left foot: Secondary | ICD-10-CM | POA: Insufficient documentation

## 2022-04-09 DIAGNOSIS — M79605 Pain in left leg: Secondary | ICD-10-CM

## 2022-04-09 DIAGNOSIS — M545 Low back pain, unspecified: Secondary | ICD-10-CM

## 2022-04-09 DIAGNOSIS — Z789 Other specified health status: Secondary | ICD-10-CM

## 2022-04-09 DIAGNOSIS — M79642 Pain in left hand: Secondary | ICD-10-CM | POA: Insufficient documentation

## 2022-04-09 DIAGNOSIS — G8929 Other chronic pain: Secondary | ICD-10-CM | POA: Insufficient documentation

## 2022-04-09 DIAGNOSIS — E119 Type 2 diabetes mellitus without complications: Secondary | ICD-10-CM

## 2022-04-09 DIAGNOSIS — M542 Cervicalgia: Secondary | ICD-10-CM | POA: Insufficient documentation

## 2022-04-09 DIAGNOSIS — G3184 Mild cognitive impairment, so stated: Secondary | ICD-10-CM | POA: Insufficient documentation

## 2022-04-09 DIAGNOSIS — F09 Unspecified mental disorder due to known physiological condition: Secondary | ICD-10-CM

## 2022-04-09 DIAGNOSIS — Z794 Long term (current) use of insulin: Secondary | ICD-10-CM | POA: Insufficient documentation

## 2022-04-09 DIAGNOSIS — M25552 Pain in left hip: Secondary | ICD-10-CM

## 2022-04-09 DIAGNOSIS — I10 Essential (primary) hypertension: Secondary | ICD-10-CM | POA: Insufficient documentation

## 2022-04-09 DIAGNOSIS — M8589 Other specified disorders of bone density and structure, multiple sites: Secondary | ICD-10-CM | POA: Diagnosis not present

## 2022-04-09 DIAGNOSIS — M899 Disorder of bone, unspecified: Secondary | ICD-10-CM

## 2022-04-09 DIAGNOSIS — S0990XS Unspecified injury of head, sequela: Secondary | ICD-10-CM

## 2022-04-09 DIAGNOSIS — M25551 Pain in right hip: Secondary | ICD-10-CM | POA: Insufficient documentation

## 2022-04-09 DIAGNOSIS — E785 Hyperlipidemia, unspecified: Secondary | ICD-10-CM | POA: Diagnosis not present

## 2022-04-09 DIAGNOSIS — M25562 Pain in left knee: Secondary | ICD-10-CM | POA: Insufficient documentation

## 2022-04-09 DIAGNOSIS — G894 Chronic pain syndrome: Secondary | ICD-10-CM | POA: Diagnosis not present

## 2022-04-09 DIAGNOSIS — Z79899 Other long term (current) drug therapy: Secondary | ICD-10-CM | POA: Insufficient documentation

## 2022-04-09 DIAGNOSIS — M79671 Pain in right foot: Secondary | ICD-10-CM | POA: Insufficient documentation

## 2022-04-09 DIAGNOSIS — F0280 Dementia in other diseases classified elsewhere without behavioral disturbance: Secondary | ICD-10-CM

## 2022-04-09 DIAGNOSIS — R0781 Pleurodynia: Secondary | ICD-10-CM | POA: Insufficient documentation

## 2022-04-09 DIAGNOSIS — M79604 Pain in right leg: Secondary | ICD-10-CM | POA: Insufficient documentation

## 2022-04-09 DIAGNOSIS — M65342 Trigger finger, left ring finger: Secondary | ICD-10-CM

## 2022-04-09 LAB — COMPREHENSIVE METABOLIC PANEL
ALT: 12 U/L (ref 0–44)
AST: 16 U/L (ref 15–41)
Albumin: 4.4 g/dL (ref 3.5–5.0)
Alkaline Phosphatase: 57 U/L (ref 38–126)
Anion gap: 7 (ref 5–15)
BUN: 14 mg/dL (ref 8–23)
CO2: 26 mmol/L (ref 22–32)
Calcium: 9.2 mg/dL (ref 8.9–10.3)
Chloride: 103 mmol/L (ref 98–111)
Creatinine, Ser: 0.8 mg/dL (ref 0.61–1.24)
GFR, Estimated: >60 mL/min (ref >60)
Glucose, Bld: 133 mg/dL — ABNORMAL HIGH (ref 70–99)
Potassium: 4.4 mmol/L (ref 3.5–5.1)
Sodium: 136 mmol/L (ref 135–145)
Total Bilirubin: 1.7 mg/dL — ABNORMAL HIGH (ref 0.3–1.2)
Total Protein: 7.3 g/dL (ref 6.5–8.1)

## 2022-04-09 LAB — C-REACTIVE PROTEIN: CRP: 0.5 mg/dL (ref <1.0)

## 2022-04-09 LAB — MAGNESIUM: Magnesium: 2.1 mg/dL (ref 1.7–2.4)

## 2022-04-09 LAB — HEMOGLOBIN A1C
Hgb A1c MFr Bld: 7.6 % — ABNORMAL HIGH (ref 4.8–5.6)
Mean Plasma Glucose: 171.42 mg/dL

## 2022-04-09 LAB — SEDIMENTATION RATE: Sed Rate: 4 mm/hr (ref 0–20)

## 2022-04-09 LAB — VITAMIN B12: Vitamin B-12: 195 pg/mL (ref 180–914)

## 2022-04-10 LAB — URINE DRUGS OF ABUSE SCREEN W ALC, ROUTINE (REF LAB)
Amphetamines, Urine: NEGATIVE ng/mL
Barbiturate, Ur: NEGATIVE ng/mL
Benzodiazepine Quant, Ur: NEGATIVE ng/mL
Cannabinoid Quant, Ur: NEGATIVE ng/mL
Cocaine (Metab.): NEGATIVE ng/mL
Ethanol U, Quan: NEGATIVE %
Methadone Screen, Urine: NEGATIVE ng/mL
Opiate Quant, Ur: NEGATIVE ng/mL
Phencyclidine, Ur: NEGATIVE ng/mL
Propoxyphene, Urine: NEGATIVE ng/mL

## 2022-04-13 LAB — COMPLIANCE DRUG ANALYSIS, UR

## 2022-04-20 LAB — 25-HYDROXY VITAMIN D LCMS D2+D3
25-Hydroxy, Vitamin D-2: <1 ng/mL
25-Hydroxy, Vitamin D-3: 6.8 ng/mL
25-Hydroxy, Vitamin D: 6.8 ng/mL — ABNORMAL LOW

## 2022-05-06 NOTE — Progress Notes (Unsigned)
PROVIDER NOTE: Information contained herein reflects review and annotations entered in association with encounter. Interpretation of such information and data should be left to medically-trained personnel. Information provided to patient can be located elsewhere in the medical record under "Patient Instructions". Document created using STT-dictation technology, any transcriptional errors that may result from process are unintentional.    Patient: Adrian Kim  Service Category: E/M  Provider: Oswaldo Done, MD  DOB: 1940-08-30  DOS: 05/07/2022  Referring Provider: [facility], [facility]*  MRN: [id number]  Specialty: Interventional Pain Management  PCP: [facility]  Type: Established Patient  Setting: Ambulatory outpatient    Location: Office  Delivery: Face-to-face     Primary Reason(s) for Visit: Encounter for evaluation before starting new chronic pain management plan of care (Level of risk: moderate) CC: No chief complaint on file.  HPI  Adrian Kim is a 82 y.o. year old, male patient, who comes today for a follow-up evaluation to review the test results and decide on a treatment plan. 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 His primarily concern today is the No chief complaint on file.  Pain Assessment: Location:     Radiating:   Onset:   Duration:   Quality:   Severity:  /10 (subjective, self-reported pain score)  Effect on ADL:   Timing:   Modifying factors:   BP:    HR:    Adrian Kim comes in today for a follow-up visit after his initial evaluation on 04/09/2022. Today we went over the results of his tests. These were explained in "Layman's terms". During today's appointment we went over my diagnostic impression, as well as the proposed treatment plan.  "The patient comes into the clinic today for the first time accompanied by his daughter in law who is of Hispanic origin.  The patient is [place].  According to the patient's daughter-in-law, the patient suffered a fall while in his basement.  He ended up hitting his head on the left side of his face, his ribs, but apparently he did not go to the ED for an evaluation.  The patient was sent to [place] for a chronic pain management evaluation.  He comes in in a wheelchair and according to his daughter-in-law he also uses a brace in the area of his chest for the ribs and rib pain.  However, trying to communicate with this patient today was very difficult since he continuously contradicted himself.  He would tell me that he was having pain in an area of his body and then when I asked him to give me more details then he would say that he did not have any pain.  After a while, it was clear to me that this patient is having some type of cognitive impairment in addition to the fact that there is  a language barrier.  He seems to understand [place] and answers questions as if he would understand the question, but he would then continuously contradict his answers.  Seen this difficulty, I turn to his daughter-in-law who indicated that he apparently had this  fall and after the fall he has been having a lot of problems with communication, not only here but at home.  She indicates that he has complained of bilateral hand pain, bilateral rib pain, neck pain, low back pain, bilateral lower extremity pain, bilateral hip pain, bilateral knee pain, and pain on both of his feet.  He has a history of non-insulin-dependent diabetes mellitus and the family indicates that after this fall that he suffered, the dynamics at home has changed.  Apparently he is extremely frustrated because he is now unable to do many things which apparently he used to do in the past.  She describes that he used to be able to take care of his home and garden, but now he is unable to do that and apparently his wife resents him for this.   His son, who is fluent in [place], apparently works in [employer] and currently has a job in [place] and was unable to attend this appointment.  This patient also have multiple medical problems involving his heart and according to her, after he had a right lower extremity catheterization for a treatment for his heart, he has had problems with his right lower extremity.   Today I will be ordering lab work as well as some x-rays in order to cover the areas that today he has complained about."   ***  Patient presented with interventional treatment options. Adrian Kim was informed that I will not be providing medication management. Pharmacotherapy evaluation including recommendations may be offered, if specifically requested.   Controlled Substance Pharmacotherapy Assessment REMS (Risk Evaluation and Mitigation Strategy)  Opioid Analgesic: None MME/day: 0 mg/day  Pill Count: None expected due to no prior prescriptions written by our practice. No notes on file Pharmacokinetics: Liberation and absorption (onset of action): WNL Distribution (time to peak effect): WNL Metabolism and excretion (duration of action): WNL         Pharmacodynamics: Desired  effects: Analgesia: Adrian Kim reports >50% benefit. Functional ability: Patient reports that medication allows him to accomplish basic ADLs Clinically meaningful improvement in function (CMIF): Sustained CMIF goals met Perceived effectiveness: Described as relatively effective, allowing for increase in activities of daily living (ADL) Undesirable effects: Side-effects or Adverse reactions: None reported Monitoring: [place] PMP: PDMP reviewed during this encounter. Online review of the past 32-month period previously conducted. Not applicable at this point since we have not taken over the patient's medication management yet. List of other Serum/Urine Drug Screening Test(s):  Lab Results  Component Value Date   COCAINSCRNUR Negative 04/09/2022   CANNABQUANT Negative 04/09/2022   List of all UDS test(s) done:  Lab Results  Component Value Date   SUMMARY Note 04/09/2022   Last UDS on record: Summary  Date Value Ref Range Status  04/09/2022 Note  Final    Comment:    ==================================================================== Compliance Drug Analysis, Ur ==================================================================== Test                             Result       Flag       Units  Drug Present not Declared for Prescription Verification   Alcohol, Ethyl  0.048        UNEXPECTED g/dL    Sources of ethyl alcohol include alcoholic beverages or as a    fermentation product of glucose; glucose is present in this specimen.    Interpret result with caution, as the presence of ethyl alcohol is    likely due, at least in part, to fermentation of glucose.  Drug Absent but Declared for Prescription Verification   Acetaminophen                  Not Detected UNEXPECTED    Acetaminophen, as indicated in the declared medication list, is not    always detected even when used as directed.    Salicylate                     Not Detected UNEXPECTED    Aspirin, as indicated  in the declared medication list, is not always    detected even when used as directed.    Ibuprofen                      Not Detected UNEXPECTED    Ibuprofen, as indicated in the declared medication list, is not    always detected even when used as directed.  ==================================================================== Test                      Result    Flag   Units      Ref Range   Creatinine              39               mg/dL      >=34 ==================================================================== Declared Medications:  The flagging and interpretation on this report are based on the  following declared medications.  Unexpected results may arise from  inaccuracies in the declared medications.   **Note: The testing scope of this panel does not include small to  moderate amounts of these reported medications:   Acetaminophen (Tylenol)  Aspirin  Ibuprofen   **Note: The testing scope of this panel does not include the  following reported medications:   Betamethasone  Canagliflozin (Invokana)  Cetirizine (Zyrtec)  Dorzolamide  Fluticasone  Latanoprost (Xalatan)  Linagliptin (Tradjenta)  Lisinopril (Zestril)  Metformin  Rosuvastatin (Crestor)  Semaglutide (Rybelsus)  Sitagliptin (Januvia)  Tamsulosin (Flomax)  Timolol (Timoptic) ==================================================================== For clinical consultation, please call [phone]. ====================================================================    UDS interpretation: No unexpected findings.          Medication Assessment Form: Not applicable. No opioids. Treatment compliance: Not applicable Risk Assessment Profile: Aberrant behavior: See initial evaluations. None observed or detected today Comorbid factors increasing risk of overdose: See initial evaluation. No additional risks detected today Opioid risk tool (ORT):     04/09/2022   11:44 AM  Opioid Risk   Alcohol 0  Illegal  Drugs 0  Rx Drugs 0  Alcohol 0  Illegal Drugs 0  Rx Drugs 0  Age between 16-45 years  0  History of Preadolescent Sexual Abuse 0  Psychological Disease 0  Depression 1  Opioid Risk Tool Scoring 1  Opioid Risk Interpretation Low Risk    ORT Scoring interpretation table:  Score <3 = Low Risk for SUD  Score between 4-7 = Moderate Risk for SUD  Score >8 = High Risk for Opioid Abuse   Risk of substance use disorder (SUD): Low  Risk Mitigation Strategies:  Patient opioid safety counseling:  No controlled substances prescribed. Patient-Prescriber Agreement (PPA): No agreement signed.  Controlled substance notification to other providers: None required. No opioid therapy.  Pharmacologic Plan: Non-opioid analgesic therapy offered. Interventional alternatives discussed.             Laboratory Chemistry Profile   Renal Lab Results  Component Value Date   BUN 14 04/09/2022   CREATININE 0.80 04/09/2022   GFRNONAA >60 04/09/2022   SPECGRAV 1.010 06/20/2020   PHUR 5.0 06/20/2020   PROTEINUR Negative 06/20/2020     Electrolytes Lab Results  Component Value Date   NA 136 04/09/2022   K 4.4 04/09/2022   CL 103 04/09/2022   CALCIUM 9.2 04/09/2022   MG 2.1 04/09/2022     Hepatic Lab Results  Component Value Date   AST 16 04/09/2022   ALT 12 04/09/2022   ALBUMIN 4.4 04/09/2022   ALKPHOS 57 04/09/2022     ID No results found for: "LYMEIGGIGMAB", "HIV", "SARSCOV2NAA", "STAPHAUREUS", "MRSAPCR", "HCVAB", "PREGTESTUR", "RMSFIGG", "QFVRPH1IGG", "QFVRPH2IGG"   Bone Lab Results  Component Value Date   25OHVITD1 6.8 (L) 04/09/2022   25OHVITD2 <1.0 04/09/2022   25OHVITD3 6.8 04/09/2022     Endocrine Lab Results  Component Value Date   GLUCOSE 133 (H) 04/09/2022   GLUCOSEU 3+ (A) 06/20/2020   HGBA1C 7.6 (H) 04/09/2022     Neuropathy Lab Results  Component Value Date   VITAMINB12 195 04/09/2022   HGBA1C 7.6 (H) 04/09/2022     CNS No results found for: "COLORCSF",  "APPEARCSF", "RBCCOUNTCSF", "WBCCSF", "POLYSCSF", "LYMPHSCSF", "EOSCSF", "PROTEINCSF", "GLUCCSF", "JCVIRUS", "CSFOLI", "IGGCSF", "LABACHR", "ACETBL"   Inflammation (CRP: Acute  ESR: Chronic) Lab Results  Component Value Date   CRP 0.5 04/09/2022   ESRSEDRATE 4 04/09/2022     Rheumatology No results found for: "RF", "ANA", "LABURIC", "URICUR", "LYMEIGGIGMAB", "LYMEABIGMQN", "HLAB27"   Coagulation Lab Results  Component Value Date   PLT 171 04/19/2020     Cardiovascular Lab Results  Component Value Date   HGB 15.8 04/19/2020   HCT 46.3 04/19/2020     Screening No results found for: "SARSCOV2NAA", "COVIDSOURCE", "STAPHAUREUS", "MRSAPCR", "HCVAB", "HIV", "PREGTESTUR"   Cancer No results found for: "CEA", "CA125", "LABCA2"   Allergens No results found for: "ALMOND", "APPLE", "ASPARAGUS", "AVOCADO", "BANANA", "BARLEY", "BASIL", "BAYLEAF", "GREENBEAN", "LIMABEAN", "WHITEBEAN", "BEEFIGE", "REDBEET", "BLUEBERRY", "BROCCOLI", "CABBAGE", "MELON", "CARROT", "CASEIN", "CASHEWNUT", "CAULIFLOWER", "CELERY"     Note: Lab results reviewed.  Recent Diagnostic Imaging Review  Cervical Imaging: Cervical MR wo contrast: No results found for this or any previous visit.  Cervical MR wo contrast: No valid procedures specified. Cervical CT wo contrast: No results found for this or any previous visit.  Cervical DG Bending/F/E views: Results for orders placed during the hospital encounter of 04/09/22  DG Cervical Spine With Flex & Extend  Narrative CLINICAL DATA:  Chronic neck pain. Cervicalgia.  EXAM: CERVICAL SPINE COMPLETE WITH FLEXION AND EXTENSION VIEWS  COMPARISON:  None Available.  FINDINGS: Straightening of normal lordosis. 2 mm retrolisthesis of C3 on C4, unchanged on flexion and extension. No evidence of instability. Disc space narrowing with anterior spurring C2-C3, C5-C6, and C6-C7, moderate in degree. There is mild multilevel facet hypertrophy. Lateral masses of C1 well  aligned on C2. No evidence of fracture, focal bone lesion or bony destructive change. No prevertebral soft tissue thickening.  IMPRESSION: 1. Straightening of normal lordosis. 2. Degenerative disc disease C2-C3, C5-C6, and C6-C7, moderate in degree. Mild multilevel facet hypertrophy. 3. Grade 1 retrolisthesis of C3 on C4, unchanged on flexion and  extension.   Electronically Signed By: Narda Rutherford M.D. On: 04/11/2022 15:18   Shoulder Imaging: Shoulder-R MR wo contrast: No results found for this or any previous visit.  Shoulder-L MR wo contrast: No results found for this or any previous visit.  Shoulder-R DG: Results for orders placed during the hospital encounter of 04/19/20  DG Shoulder Right  Narrative CLINICAL DATA:  Shortness of breath shoulder pain after fall  EXAM: RIGHT SHOULDER - 2+ VIEW  COMPARISON:  None.  FINDINGS: There is no evidence of fracture or dislocation. 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 Mild overlying soft tissue swelling is seen.  IMPRESSION: No acute osseous abnormality.   Electronically Signed By: Jonna Clark M.D. On: 04/19/2020 18:34  Shoulder-L DG: No results found for this or any previous visit.   Thoracic Imaging: Thoracic MR wo contrast: No results found for this or any previous visit.  Thoracic MR wo contrast: No valid procedures specified. Thoracic CT wo contrast: No results found for this or any previous visit.  Thoracic DG 4 views: No results found for this or any previous visit.  Thoracic DG w/swimmers view: No results found for this or any previous visit.   Lumbosacral Imaging: Lumbar MR wo contrast: No results found for this or any previous visit.  Lumbar MR wo contrast: No valid procedures specified. Lumbar CT wo contrast: No results found for this or any previous visit.  Lumbar DG Bending views: Results for orders placed during the hospital encounter of 04/09/22  DG  Lumbar Spine Complete W/Bend  Narrative CLINICAL DATA:  Chronic bilateral low back pain without sciatica. Chronic pain of lower extremity, bilateral.  EXAM: LUMBAR SPINE - COMPLETE WITH BENDING VIEWS  COMPARISON:  None Available.  FINDINGS: There are 5 non-rib-bearing lumbar vertebra. Mild broad-based levo scoliotic curvature at the thoracolumbar junction. Straightening of normal lordosis. No listhesis. No abnormal motion on flexion or extension. There is moderate diffuse disc space narrowing and spurring from L1-L2 through L5-S1. Mild multilevel facet hypertrophy, most prominent at L3-L4 and L5-S1. Normal vertebral body heights. No evidence of fracture, bony destructive change or focal lesion. No visible pars defects. Aortic atherosclerosis.  IMPRESSION: 1. Mild broad-based levo scoliotic curvature at the thoracolumbar junction. 2. Moderate diffuse degenerative disc disease. 3. Mild facet arthropathy at L3-L4 and L5-S1.   Electronically Signed By: Narda Rutherford M.D. On: 04/11/2022 15:20         Sacroiliac Joint Imaging: Sacroiliac Joint DG: No results found for this or any previous visit.   Hip Imaging: Hip-R MR wo contrast: No results found for this or any previous visit.  Hip-L MR wo contrast: No results found for this or any previous visit.  Hip-R CT wo contrast: No results found for this or any previous visit.  Hip-L CT wo contrast: No results found for this or any previous visit.  Hip-R DG 2-3 views: Results for orders placed during the hospital encounter of 04/09/22  DG HIP UNILAT W OR W/O PELVIS 2-3 VIEWS RIGHT  Narrative CLINICAL DATA:  Right hip pain  EXAM: DG HIP (WITH OR WITHOUT PELVIS) 2-3V RIGHT  COMPARISON:  None Available.  FINDINGS: No acute fracture or dislocation. No aggressive osseous lesion. Normal alignment. Generalized osteopenia. Lower lumbar spine spondylosis. Mild-moderate osteoarthritis of the right hip.  Soft tissue are  unremarkable. No radiopaque foreign body or soft tissue emphysema. Peripheral vascular atherosclerotic disease.  IMPRESSION: 1. Mild-moderate osteoarthritis of the right hip.   Electronically Signed By:  Elige Ko M.D. On: 04/10/2022 12:23  Hip-L DG 2-3 views: Results for orders placed during the hospital encounter of 04/09/22  DG HIP UNILAT W OR W/O PELVIS 2-3 VIEWS LEFT  Narrative CLINICAL DATA:  Chronic bilateral hip pain. Chronic pain of bilateral lower extremities.  EXAM: DG HIP (WITH OR WITHOUT PELVIS) 2-3V LEFT  COMPARISON:  None Available.  FINDINGS: Mild left hip joint space narrowing with acetabular spurring. Femoral head is well seated in the acetabulum. No evidence of fracture. No avascular necrosis or bony destructive change. Intact pubic rami. Peripheral vascular calcifications.  IMPRESSION: Mild osteoarthritis of the left hip.   Electronically Signed By: Narda Rutherford M.D. On: 04/11/2022 15:24  Hip-B DG Bilateral: No results found for this or any previous visit.   Knee Imaging: Knee-R MR wo contrast: No results found for this or any previous visit.  Knee-L MR wo contrast: No results found for this or any previous visit.  Knee-R CT wo contrast: No results found for this or any previous visit.  Knee-L CT wo contrast: No results found for this or any previous visit.  Knee-R DG 4 views: Results for orders placed during the hospital encounter of 04/09/22  DG Knee Complete 4 Views Right  Narrative CLINICAL DATA:  Pain.  EXAM: RIGHT KNEE - COMPLETE 4+ VIEW  COMPARISON:  None Available.  FINDINGS: Tricompartmental degenerative changes, most prominent in the medial compartment. No fracture or dislocation. No bony lesions. No significant joint effusion. Vascular calcifications in the posterior vasculature.  IMPRESSION: Tricompartmental degenerative changes, most prominent in the medial compartment.   Electronically Signed By: Gerome Sam III M.D. On: 04/11/2022 14:30  Knee-L DG 4 views: Results for orders placed during the hospital encounter of 04/09/22  DG Knee Complete 4 Views Left  Narrative CLINICAL DATA:  Chronic bilateral knee pain. Chronic pain of lower extremity, bilateral. Left knee pain/arthralgia.  EXAM: LEFT KNEE - COMPLETE 4+ VIEW  COMPARISON:  No prior left knee exams.  FINDINGS: Mild medial tibiofemoral joint space narrowing. Patellar [place]. Tibiofemoral peripheral spurring is most prominent in the medial tibiofemoral compartment. No fracture. No erosion, periostitis or focal bone abnormalities. No large knee joint effusion. Vascular calcifications. Otherwise unremarkable soft tissues.  IMPRESSION: 1. Mild osteoarthritis most prominent in the medial tibiofemoral compartment. 2. Mild patellar [place].   Electronically Signed By: Narda Rutherford M.D. On: 04/11/2022 15:15   Ankle Imaging: Ankle-R DG Complete: No results found for this or any previous visit.  Ankle-L DG Complete: No results found for this or any previous visit.   Foot Imaging: Foot-R DG Complete: Results for orders placed during the hospital encounter of 04/09/22  DG Foot Complete Right  Narrative CLINICAL DATA:  Chronic pain of lower extremity, bilateral. Chronic bilateral foot pain.  EXAM: RIGHT FOOT COMPLETE - 3+ VIEW  COMPARISON:  None Available.  FINDINGS: Slight hammertoe deformity of the fourth and fifth digits. Alignment is otherwise normal. Moderate joint space narrowing and spurring at the first metatarsal phalangeal joint. Calcified spur arises from the lateral aspect of the third metacarpal phalangeal joint that may be degenerative or sequela of remote injury. Small plantar calcaneal spur. No erosion or periostitis. Vascular calcifications are seen.  IMPRESSION: 1. Slight hammertoe deformity of the fourth and fifth digits. 2. 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 No evidence of inflammatory arthropathy. 3. Small plantar calcaneal spur.   Electronically Signed By: Narda Rutherford M.D. On: 04/11/2022 15:26  Foot-L DG Complete: Results for orders  placed during the hospital encounter of 04/09/22  DG Foot Complete Left  Narrative CLINICAL DATA:  Chronic bilateral foot pain. Chronic pain of lower extremities.  EXAM: LEFT FOOT - COMPLETE 3+ VIEW  COMPARISON:  None Available.  FINDINGS: Moderate hallux valgus. Hammertoe deformity of the second toe. Minimal joint space narrowing and spurring at the first metatarsal phalangeal joint. No fracture. Small plantar calcaneal spur. No erosion or periostitis. There are vascular calcifications.  IMPRESSION: 1. Moderate hallux valgus with mild osteoarthritis of the first metatarsophalangeal joint. 2. Hammertoe deformity of the second toe. 3. Small plantar calcaneal spur.   Electronically Signed By: Narda Rutherford M.D. On: 04/11/2022 15:27   Elbow Imaging: Elbow-R DG Complete: No results found for this or any previous visit.  Elbow-L DG Complete: No results found for this or any previous visit.   Wrist Imaging: Wrist-R DG Complete: No results found for this or any previous visit.  Wrist-L DG Complete: No results found for this or any previous visit.   Hand Imaging: Hand-R DG Complete: Results for orders placed during the hospital encounter of 04/09/22  DG Hand Complete Right  Narrative CLINICAL DATA:  Chronic bilateral hand pain.  EXAM: RIGHT HAND - COMPLETE 3+ VIEW  COMPARISON:  Report from index finger radiograph 04/12/2004  FINDINGS: Mild subjective bony under mineralization. Small osteophytes throughout the digits at distal and proximal interphalangeal joints, as well as the metacarpal phalangeal joints. Mild spurring at the thumb carpal metacarpal joint and thumb interphalangeal joint. Remote posttraumatic deformity of the index finger distal tuft. No acute  fracture. No erosion or periostitis. There are vascular calcifications.  IMPRESSION: 1. Mild osteoarthritis of the digits. No evidence of inflammatory arthropathy. 2. Remote posttraumatic deformity of the index finger distal tuft.   Electronically Signed By: Narda Rutherford M.D. On: 04/11/2022 15:23  Hand-L DG Complete: Results for orders placed during the hospital encounter of 04/09/22  DG Hand Complete Left  Narrative CLINICAL DATA:  Chronic bilateral hand pain.  EXAM: LEFT HAND - COMPLETE 3+ VIEW  COMPARISON:  None Available.  FINDINGS: Mild subjective bony under mineralization. Alignment is normal. Small osteophytes throughout the digits at the distal and proximal interphalangeal joints as well as thumb interphalangeal joint. Mild spurring at the metacarpal phalangeal joints. Mild joint space narrowing and spurring at the thumb carpal metacarpal joint. No erosions or periostitis. No fracture. No focal bone abnormality. There are vascular calcifications.  IMPRESSION: 1. Mild osteoarthritis of the digits and thumb carpometacarpal joint. No evidence of inflammatory arthropathy. 2. Vascular calcifications.   Electronically Signed By: Narda Rutherford M.D. On: 04/11/2022 15:21   Complexity Note: Imaging results reviewed.                         Meds   Current Outpatient Medications:    acetaminophen (TYLENOL) 325 MG tablet, Take 650 mg by mouth every 6 (six) hours as needed., Disp: , Rfl:    aspirin EC 81 MG tablet, Take by mouth., Disp: , Rfl:    betamethasone valerate ointment (VALISONE) 0.1 %, Apply 1 application topically 2 (two) times daily. Apply to foreskin twice daily, Disp: 30 g, Rfl: 0   cetirizine (ZYRTEC) 5 MG chewable tablet, Chew 5 mg by mouth daily., Disp: , Rfl:    dorzolamide-timolol (COSOPT) 2-0.5 % ophthalmic solution, 1 drop 2 (two) times daily., Disp: , Rfl:    fluticasone (FLONASE) 50 MCG/ACT nasal spray, , Disp: , Rfl:    glucose blood  test strip, , Disp: , Rfl:    ibuprofen (ADVIL) 400 MG tablet, Take 1 tablet (400 mg total) by mouth every 6 (six) hours as needed., Disp: 30 tablet, Rfl: 0   INVOKANA 300 MG TABS tablet, Take 300 mg by mouth daily., Disp: , Rfl:    latanoprost (XALATAN) 0.005 % ophthalmic solution, , Disp: , Rfl:    linagliptin (TRADJENTA) 5 MG TABS tablet, Take 5 mg by mouth daily., Disp: , Rfl:    lisinopril (PRINIVIL,ZESTRIL) 2.5 MG tablet, , Disp: , Rfl:    metFORMIN (GLUCOPHAGE-XR) 500 MG 24 hr tablet, , Disp: , Rfl:    rosuvastatin (CRESTOR) 20 MG tablet, Take 20 mg by mouth at bedtime., Disp: , Rfl:    RYBELSUS 7 MG TABS, Take 1 tablet by mouth daily., Disp: , Rfl:    sitaGLIPtin (JANUVIA) 25 MG tablet, Take 25 mg by mouth daily., Disp: , Rfl:    tamsulosin (FLOMAX) 0.4 MG CAPS capsule, Take 0.4 mg by mouth., Disp: , Rfl:    timolol (TIMOPTIC) 0.5 % ophthalmic solution, , Disp: , Rfl:    TRUEplus Lancets 28G MISC, , Disp: , Rfl:   ROS  Constitutional: Denies any fever or chills Gastrointestinal: No reported hemesis, hematochezia, vomiting, or acute GI distress Musculoskeletal: Denies any acute onset joint swelling, redness, loss of ROM, or weakness Neurological: No reported episodes of acute onset apraxia, aphasia, dysarthria, agnosia, amnesia, paralysis, loss of coordination, or loss of consciousness  Allergies  Adrian Kim is allergic to zolpidem.  PFSH  Drug: Adrian Kim  reports no history of drug use. Alcohol:  reports no history of alcohol use. Tobacco:  reports that he has quit smoking. He has never used smokeless tobacco. Medical:  has a past medical history of Arthritis, Diabetes (HCC), Glaucoma, Heart attack (HCC), HLD (hyperlipidemia), HTN (hypertension), Lower back pain, Sleep apnea, and Tinnitus. Surgical: Adrian Kim  has a past surgical history that includes Coronary angioplasty with stent; Colonoscopy with propofol (N/A, 12/10/2014); and Cataract extraction w/PHACO (Right,  11/16/2015). Family: family history includes Leukemia in his son.  Constitutional Exam  General appearance: Well nourished, well developed, and well hydrated. In no apparent acute distress There were no vitals filed for this visit. BMI Assessment: Estimated body mass index is 21.7 kg/m as calculated from the following:   Height as of 04/09/22: 6' (1.829 m).   Weight as of 04/09/22: 160 lb (72.6 kg).  BMI interpretation table: BMI level Category Range association with higher incidence of chronic pain  <18 kg/m2 Underweight   18.5-24.9 kg/m2 Ideal body weight   25-29.9 kg/m2 Overweight Increased incidence by 20%  30-34.9 kg/m2 Obese (Class I) Increased incidence by 68%  35-39.9 kg/m2 Severe obesity (Class II) Increased incidence by 136%  >40 kg/m2 Extreme obesity (Class III) Increased incidence by 254%   Patient's current BMI Ideal Body weight  There is no height or weight on file to calculate BMI. Patient weight not recorded   BMI Readings from Last 4 Encounters:  04/09/22 21.70 kg/m  01/30/21 22.96 kg/m  10/31/20 24.59 kg/m  09/12/20 25.99 kg/m   Wt Readings from Last 4 Encounters:  04/09/22 160 lb (72.6 kg)  01/30/21 160 lb (72.6 kg)  10/31/20 157 lb (71.2 kg)  09/12/20 176 lb (79.8 kg)    Psych/Mental status: Alert, oriented x 3 (person, place, & time)       Eyes: PERLA Respiratory: No evidence of acute respiratory distress  Assessment & Plan  Primary Diagnosis & Pertinent Problem  List: There were no encounter diagnoses.  Visit Diagnosis: No diagnosis found. Problems updated and reviewed during this visit: No problems updated.  Plan of Care  Pharmacotherapy (Medications Ordered): No orders of the defined types were placed in this encounter.  Procedure Orders    No procedure(s) ordered today   Lab Orders  No laboratory test(s) ordered today   Imaging Orders  No imaging studies ordered today   Referral Orders  No referral(s) requested today     Pharmacological management:  Opioid Analgesics: I will not be prescribing any opioids at this time Membrane stabilizer: I will not be prescribing any at this time Muscle relaxant: I will not be prescribing any at this time NSAID: I will not be prescribing any at this time Other analgesic(s): I will not be prescribing any at this time      Interventional Therapies  Risk Factors  Considerations:  IDDM  CAD  Hx. MI  (B) Carotis A. Stenosis  HTN  AoV stenosis  Mitral & Tricuspid V. insufficiency  OSA   Planned  Pending:   See above for possible orders   Under consideration:   Pending completion of evaluation   Completed:   None at this time   Completed by other providers:   None at this time   Therapeutic  Palliative (PRN) options:   None established        Provider-requested follow-up: No follow-ups on file. Recent Visits Date Type Provider Dept  04/09/22 Office Visit Delano Metz, MD [facility]  Showing recent visits within past 90 days and meeting all other requirements Future Appointments Date Type Provider Dept  05/07/22 Appointment Delano Metz, MD [facility]  Showing future appointments within next 90 days and meeting all other requirements   Primary Care Physician: [facility]  Duration of encounter: *** minutes.  Total time on encounter, as per AMA guidelines included both the face-to-face and non-face-to-face time personally spent by the physician and/or other qualified health care professional(s) on the day of the encounter (includes time in activities that require the physician or other qualified health care professional and does not include time in activities normally performed by clinical staff). Physician's time may include the following activities when performed: Preparing to see the patient (e.g., pre-charting review of records, searching for previously ordered imaging, lab work, and  nerve conduction tests) Review of prior analgesic pharmacotherapies. Reviewing PMP Interpreting ordered tests (e.g., lab work, imaging, nerve conduction tests) Performing post-procedure evaluations, including interpretation of diagnostic procedures Obtaining and/or reviewing separately obtained history Performing a medically appropriate examination and/or evaluation Counseling and educating the patient/family/caregiver Ordering medications, tests, or procedures Referring and communicating with other health care professionals (when not separately reported) Documenting clinical information in the electronic or other health record Independently interpreting results (not separately reported) and communicating results to the patient/ family/caregiver Care coordination (not separately reported)  Note by: Oswaldo Done, MD (TTS technology used. I apologize for any typographical errors that were not detected and corrected.) Date: 05/07/2022; Time: 9:36 AM

## 2022-05-07 ENCOUNTER — Ambulatory Visit: Payer: Medicare Other | Attending: Pain Medicine | Admitting: Pain Medicine

## 2022-05-07 VITALS — BP 101/62 | HR 89 | Temp 97.5°F | Resp 18 | Ht 65.0 in | Wt 160.0 lb

## 2022-05-07 DIAGNOSIS — M160 Bilateral primary osteoarthritis of hip: Secondary | ICD-10-CM | POA: Diagnosis present

## 2022-05-07 DIAGNOSIS — M4312 Spondylolisthesis, cervical region: Secondary | ICD-10-CM

## 2022-05-07 DIAGNOSIS — M25562 Pain in left knee: Secondary | ICD-10-CM | POA: Insufficient documentation

## 2022-05-07 DIAGNOSIS — M19072 Primary osteoarthritis, left ankle and foot: Secondary | ICD-10-CM | POA: Insufficient documentation

## 2022-05-07 DIAGNOSIS — M47812 Spondylosis without myelopathy or radiculopathy, cervical region: Secondary | ICD-10-CM | POA: Insufficient documentation

## 2022-05-07 DIAGNOSIS — M25561 Pain in right knee: Secondary | ICD-10-CM | POA: Diagnosis not present

## 2022-05-07 DIAGNOSIS — M5030 Other cervical disc degeneration, unspecified cervical region: Secondary | ICD-10-CM | POA: Diagnosis present

## 2022-05-07 DIAGNOSIS — M159 Polyosteoarthritis, unspecified: Secondary | ICD-10-CM | POA: Diagnosis present

## 2022-05-07 DIAGNOSIS — R42 Dizziness and giddiness: Secondary | ICD-10-CM | POA: Diagnosis not present

## 2022-05-07 DIAGNOSIS — M47816 Spondylosis without myelopathy or radiculopathy, lumbar region: Secondary | ICD-10-CM | POA: Insufficient documentation

## 2022-05-07 DIAGNOSIS — R262 Difficulty in walking, not elsewhere classified: Secondary | ICD-10-CM | POA: Insufficient documentation

## 2022-05-07 DIAGNOSIS — I700 Atherosclerosis of aorta: Secondary | ICD-10-CM | POA: Insufficient documentation

## 2022-05-07 DIAGNOSIS — M19042 Primary osteoarthritis, left hand: Secondary | ICD-10-CM | POA: Diagnosis present

## 2022-05-07 DIAGNOSIS — M170 Bilateral primary osteoarthritis of knee: Secondary | ICD-10-CM | POA: Insufficient documentation

## 2022-05-07 DIAGNOSIS — M19071 Primary osteoarthritis, right ankle and foot: Secondary | ICD-10-CM | POA: Insufficient documentation

## 2022-05-07 DIAGNOSIS — M4310 Spondylolisthesis, site unspecified: Secondary | ICD-10-CM | POA: Insufficient documentation

## 2022-05-07 DIAGNOSIS — G8929 Other chronic pain: Secondary | ICD-10-CM | POA: Diagnosis present

## 2022-05-07 DIAGNOSIS — M19041 Primary osteoarthritis, right hand: Secondary | ICD-10-CM | POA: Insufficient documentation

## 2022-05-07 DIAGNOSIS — E559 Vitamin D deficiency, unspecified: Secondary | ICD-10-CM | POA: Diagnosis present

## 2022-05-07 DIAGNOSIS — M1711 Unilateral primary osteoarthritis, right knee: Secondary | ICD-10-CM | POA: Diagnosis present

## 2022-05-07 DIAGNOSIS — R2689 Other abnormalities of gait and mobility: Secondary | ICD-10-CM | POA: Diagnosis not present

## 2022-05-07 DIAGNOSIS — M5137 Other intervertebral disc degeneration, lumbosacral region: Secondary | ICD-10-CM | POA: Diagnosis present

## 2022-05-07 DIAGNOSIS — M19011 Primary osteoarthritis, right shoulder: Secondary | ICD-10-CM | POA: Diagnosis present

## 2022-05-07 MED ORDER — VITAMIN D3 125 MCG (5000 UT) PO CAPS: 5000.0000 [IU] | ORAL_CAPSULE | Freq: Every day | ORAL | 0 refills | Status: AC

## 2022-05-07 MED ORDER — ERGOCALCIFEROL 1.25 MG (50000 UT) PO CAPS: 50000.0000 [IU] | ORAL_CAPSULE | ORAL | 0 refills | Status: AC

## 2022-05-07 NOTE — Patient Instructions (Signed)

## 2022-05-15 ENCOUNTER — Encounter: Payer: Self-pay | Admitting: Pain Medicine

## 2022-05-15 ENCOUNTER — Ambulatory Visit: Payer: Medicare Other | Attending: Pain Medicine | Admitting: Pain Medicine

## 2022-05-15 VITALS — BP 113/63 | HR 84 | Temp 97.5°F | Resp 16 | Ht 65.0 in | Wt 173.3 lb

## 2022-05-15 DIAGNOSIS — M25561 Pain in right knee: Secondary | ICD-10-CM | POA: Insufficient documentation

## 2022-05-15 DIAGNOSIS — M25562 Pain in left knee: Secondary | ICD-10-CM | POA: Insufficient documentation

## 2022-05-15 DIAGNOSIS — M1711 Unilateral primary osteoarthritis, right knee: Secondary | ICD-10-CM | POA: Diagnosis present

## 2022-05-15 DIAGNOSIS — G8929 Other chronic pain: Secondary | ICD-10-CM | POA: Insufficient documentation

## 2022-05-15 DIAGNOSIS — M170 Bilateral primary osteoarthritis of knee: Secondary | ICD-10-CM | POA: Insufficient documentation

## 2022-05-15 MED ORDER — PENTAFLUOROPROP-TETRAFLUOROETH EX AERO
INHALATION_SPRAY | Freq: Once | CUTANEOUS | Status: AC
  Administered 2022-05-15: 30 via TOPICAL

## 2022-05-15 MED ORDER — METHYLPREDNISOLONE ACETATE 80 MG/ML IJ SUSP
80.0000 mg | Freq: Once | INTRAMUSCULAR | Status: AC
  Administered 2022-05-15: 80 mg via INTRA_ARTICULAR
  Filled 2022-05-15: qty 1

## 2022-05-15 MED ORDER — LIDOCAINE HCL (PF) 2 % IJ SOLN
INTRAMUSCULAR | Status: AC
  Filled 2022-05-15: qty 5

## 2022-05-15 MED ORDER — LIDOCAINE HCL (PF) 2 % IJ SOLN
5.0000 mL | Freq: Once | INTRAMUSCULAR | Status: AC
  Administered 2022-05-15: 5 mL
  Filled 2022-05-15: qty 5

## 2022-05-15 MED ORDER — LIDOCAINE HCL (PF) 1 % IJ SOLN
INTRAMUSCULAR | Status: AC
  Filled 2022-05-15: qty 5

## 2022-05-15 MED ORDER — LIDOCAINE HCL (PF) 1 % IJ SOLN
5.0000 mL | Freq: Once | INTRAMUSCULAR | Status: AC
  Administered 2022-05-15: 5 mL
  Filled 2022-05-15: qty 5

## 2022-05-15 MED ORDER — ROPIVACAINE HCL 2 MG/ML IJ SOLN
2.0000 mL | Freq: Once | INTRAMUSCULAR | Status: AC
  Administered 2022-05-15: 2 mL via INTRA_ARTICULAR
  Filled 2022-05-15: qty 20

## 2022-05-15 MED ORDER — ROPIVACAINE HCL 2 MG/ML IJ SOLN
2.0000 mL | Freq: Once | INTRAMUSCULAR | Status: AC
  Administered 2022-05-15: 2 mL via INTRA_ARTICULAR

## 2022-05-15 NOTE — Patient Instructions (Signed)

## 2022-05-15 NOTE — Progress Notes (Signed)
PROVIDER NOTE: Interpretation of information contained herein should be left to medically-trained personnel. Specific patient instructions are provided elsewhere under "Patient Instructions" section of medical record. This document was created in part using STT-dictation technology, any transcriptional errors that may result from this process are unintentional.  Patient: Adrian Kim Type: Established DOB: [birth year] MRN: [id number] [facility]: [facility]  Service: Procedure DOS: 05/15/2022 Setting: Ambulatory Location: Ambulatory outpatient facility Delivery: Face-to-face Provider: Oswaldo Done, MD Specialty: Interventional Pain Management Specialty designation: 09 Location: Outpatient facility Ref. Prov.: [facility]       Interventional Therapy   Type: Steroid Intra-articular Knee Injection #1  Laterality: Bilateral (-50) Level/approach: Lateral Imaging guidance: None required (CPT-20610) Anesthesia: Local anesthesia (1-2% Lidocaine) Anxiolysis: None                 Sedation: No Sedation                       DOS: 05/15/2022  Performed by: Oswaldo Done, MD  Purpose: Diagnostic/Therapeutic Indications: Knee arthralgia associated to osteoarthritis of the knee 1. Chronic knee pain (1ry area of Pain) (Bilateral)   2. Primary osteoarthritis of knees (Bilateral)   3. Tricompartment osteoarthritis of knee (Right)    NAS-11 score:   Pre-procedure: 0-No pain/10   Post-procedure: 0-No pain/10   Note: Electronic translator available during the entire procedure.    Pre-Procedure Preparation  Monitoring: As per clinic protocol.  Risk Assessment: Vitals:  GMW:[id number] body mass index is 28.84 kg/m as calculated from the following:   Height as of this encounter: 5\' 5"  (1.651 m).   Weight as of this encounter: 173 lb 4.8 oz (78.6 kg)., Rate:84 , BP:113/63, Resp:16, Temp:(!) 97.5 F (36.4 C), SpO2:96 %  Allergies: He is allergic to  zolpidem.  Precautions: No additional precautions required  Blood-thinner(s): None at this time  Coagulopathies: Reviewed. None identified.   Active Infection(s): Reviewed. None identified. Adrian Kim is afebrile   Location setting: Exam room Position: Sitting w/ knee bent 90 degrees Safety Precautions: Patient was assessed for positional comfort and pressure points before starting the procedure. Prepping solution: DuraPrep (Iodine Povacrylex [0.7% available iodine] and Isopropyl Alcohol, 74% w/w) Prep Area: Entire knee region Approach: percutaneous, just above the tibial plateau, lateral to the infrapatellar tendon. Intended target: Intra-articular knee space Materials: Tray: Block Needle(s): Regular Qty: 1/side Length: 1.5-inch Gauge: 25G (x1) + 22G (x1)  Meds ordered this encounter  Medications   pentafluoroprop-tetrafluoroeth (GEBAUERS) aerosol   lidocaine (PF) (XYLOCAINE) 1 % injection 5 mL   lidocaine HCl (PF) (XYLOCAINE) 2 % injection 5 mL   methylPREDNISolone acetate (DEPO-MEDROL) injection 80 mg   ropivacaine (PF) 2 mg/mL (0.2%) (NAROPIN) injection 2 mL   methylPREDNISolone acetate (DEPO-MEDROL) injection 80 mg   ropivacaine (PF) 2 mg/mL (0.2%) (NAROPIN) injection 2 mL    Orders Placed This Encounter  Procedures   KNEE INJECTION    Local Anesthetic & Steroid injection.    Scheduling Instructions:     Side(s): Bilateral Knee     Sedation: None     Timeframe: Today    Order Specific Question:   Where will this procedure be performed?    Answer:   [facility]   Informed Consent Details: Physician/Practitioner Attestation; Transcribe to consent form and obtain patient signature    Note: Always confirm laterality of pain with Adrian Kim, before procedure. Transcribe to consent form and obtain patient signature.    Order Specific  Question:   Physician/Practitioner attestation of informed consent for procedure/surgical case    Answer:   I, the  physician/practitioner, attest that I have discussed with the patient the benefits, risks, side effects, alternatives, likelihood of achieving goals and potential problems during recovery for the procedure that I have provided informed consent.    Order Specific Question:   Procedure    Answer:   Bilateral intra-articular knee arthrocentesis (aspiration and/or injection)    Order Specific Question:   Physician/Practitioner performing the procedure    Answer:   Mikhael Hendriks A. Laban Emperor, MD    Order Specific Question:   Indication/Reason    Answer:   Chronic bilateral knee pain secondary to knee arthropathy/arthralgia   Provide equipment / supplies at bedside    Procedure tray: "Block Tray" (Disposable  single use) Skin infiltration needle: Regular 1.5-in, 25-G, (x1) Block Needle type: Regular Amount/quantity: 2 Size: Short(1.5-inch) Gauge: (25G x1) + (22G x1)    Standing Status:   Standing    Number of Occurrences:   1    Order Specific Question:   Specify    Answer:   Block Tray     Time-out: 1143 I initiated and conducted the "Time-out" before starting the procedure, as per protocol. The patient was asked to participate by confirming the accuracy of the "Time Out" information. Verification of the correct person, site, and procedure were performed and confirmed by me, the nursing staff, and the patient. "Time-out" conducted as per Joint Commission's Universal Protocol (UP.01.01.01). Procedure checklist: Completed  H&P (Pre-op  Assessment)  Adrian Kim is a 82 y.o. (year old), male patient, seen today for interventional treatment. He  has a past surgical history that includes Coronary angioplasty with stent; Colonoscopy with propofol (N/A, 12/10/2014); and Cataract extraction w/PHACO (Right, 11/16/2015). Adrian Kim has a current medication list which includes the following prescription(s): acetaminophen, aspirin ec, betamethasone valerate ointment, cetirizine, vitamin d3, dorzolamide-timolol,  ergocalciferol, fluticasone, glucose blood, ibuprofen, invokana, latanoprost, linagliptin, lisinopril, metformin, rosuvastatin, rybelsus, sitagliptin, tamsulosin, timolol, and trueplus lancets 28g. His primarily concern today is the No chief complaint on file.  He is allergic to zolpidem.   Last encounter: My last encounter with him was on 05/07/2022. Pertinent problems: Mr. 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 Pain Assessment: Severity of Chronic pain is reported as a 0-No pain/10. Location: Knee Left, Right/denies. Onset: More than a month ago. Quality: Aching, Burning, Stabbing, Sharp (shakes from the pain). Timing: Constant. Modifying factor(s): lying down rest. Vitals:  height is 5\' 5"  (1.651 m) and weight is 173 lb 4.8 oz (78.6 kg). His temporal temperature is 97.5 F (36.4 C) (abnormal). His blood pressure is 113/63 and his pulse is 84. His respiration is 16 and oxygen  saturation  is 96%.   Reason for encounter: "interventional pain management therapy due pain of at least four (4) weeks in duration, with failure to respond and/or inability to tolerate more conservative care.  Site Confirmation: Mr. Hazelrigg was asked to confirm the procedure and laterality before marking the site.  Consent: Before the procedure and under the influence of no sedative(s), amnesic(s), or anxiolytics, the patient was informed of the treatment options, risks and possible complications. To fulfill our ethical and legal obligations, as recommended by the American Medical Association's Code of Ethics, I have informed the patient of my clinical impression; the nature and purpose of the treatment or procedure; the risks, benefits, and possible complications of the intervention; the alternatives, including doing nothing; the risk(s) and benefit(s) of the alternative treatment(s) or procedure(s); and the risk(s) and benefit(s) of doing nothing. The patient was provided information about the general risks and possible complications associated with the procedure. These may include, but are not limited to: failure to achieve desired goals, infection, bleeding, organ or nerve damage, allergic reactions, paralysis, and death. In addition, the patient was informed of those risks and complications associated to Spine-related procedures, such as failure to decrease pain; infection (i.e.: Meningitis, epidural or intraspinal abscess); bleeding (i.e.: epidural hematoma, subarachnoid hemorrhage, or any other type of intraspinal or peri-dural bleeding); organ or nerve damage (i.e.: Any type of peripheral nerve, nerve root, or spinal cord injury) with subsequent damage to sensory, motor, and/or autonomic systems, resulting in permanent pain, numbness, and/or weakness of one or several areas of the body; allergic reactions; (i.e.: anaphylactic reaction); and/or death. Furthermore, the patient was informed of those  risks and complications associated with the medications. These include, but are not limited to: allergic reactions (i.e.: anaphylactic or anaphylactoid reaction(s)); adrenal axis suppression; blood sugar elevation that in diabetics may result in ketoacidosis or comma; water retention that in patients with history of congestive heart failure may result in shortness of breath, pulmonary edema, and decompensation with resultant heart failure; weight gain; swelling or edema; medication-induced neural toxicity; particulate matter embolism and blood vessel occlusion with resultant organ, and/or nervous system infarction; and/or aseptic necrosis of one or more joints. Finally, the patient was informed that Medicine is not an exact science; therefore, there is also the possibility of unforeseen or unpredictable risks and/or possible complications that may result in a catastrophic outcome. The patient indicated having understood very clearly. We have given the patient no guarantees and we have made no promises. Enough time was given to the patient to ask questions, all of which were answered to the patient's satisfaction. Mr. Burnet has indicated that he wanted to continue with the procedure. Attestation: I, the ordering provider, attest that I have discussed with the patient the benefits, risks, side-effects, alternatives, likelihood of achieving goals, and potential problems during recovery for the procedure that I have provided informed consent.  Date  Time: 05/15/2022 11:22 AM  Description of procedure  Start Time: 1143 hrs  Local Anesthesia: Once the patient was positioned, prepped, and time-out was completed. The target area was identified located. The skin was marked with an approved surgical skin marker. Once marked, the skin (epidermis, dermis, and hypodermis), and deeper tissues (fat, connective tissue and muscle) were infiltrated with a small amount of a short-acting local anesthetic, loaded on a 10cc  syringe with a 25G, 1.5-in  Needle. An appropriate amount of time was allowed for local anesthetics to take effect before proceeding to the next step. Local Anesthetic: Lidocaine 1-2% The  unused portion of the local anesthetic was discarded in the proper designated containers. Safety Precautions: Aspiration looking for blood return was conducted prior to all injections. At no point did I inject any substances, as a needle was being advanced. Before injecting, the patient was told to immediately notify me if he was experiencing any new onset of "ringing in the ears, or metallic taste in the mouth". No attempts were made at seeking any paresthesias. Safe injection practices and needle disposal techniques used. Medications properly checked for expiration dates. SDV (single dose vial) medications used. After the completion of the procedure, all disposable equipment used was discarded in the proper designated medical waste containers.  Technical description: Protocol guidelines were followed. After positioning, the target area was identified and prepped in the usual manner. Skin & deeper tissues infiltrated with local anesthetic. Appropriate amount of time allowed to pass for local anesthetics to take effect. Proper needle placement secured. Once satisfactory needle placement was confirmed, I proceeded to inject the desired solution in slow, incremental fashion, intermittently assessing for discomfort or any signs of abnormal or undesired spread of substance. Once completed, the needle was removed and disposed of, as per hospital protocols. The area was cleaned, making sure to leave some of the prepping solution back to take advantage of its long term bactericidal properties.  Aspiration:  Negative        Vitals:   05/15/22 1120  BP: 113/63  Pulse: 84  Resp: 16  Temp: (!) 97.5 F (36.4 C)  TempSrc: Temporal  SpO2: 96%  Weight: 173 lb 4.8 oz (78.6 kg)  Height: 5\' 5"  (1.651 m)    End Time: 1145  hrs  Imaging guidance  Imaging-assisted Technique: None required. Indication(s): N/A Exposure Time: N/A Contrast: None Fluoroscopic Guidance: N/A Ultrasound Guidance: N/A Interpretation: N/A  Post-op assessment  Post-procedure Vital Signs:  Pulse/HCG Rate: 84  Temp: (!) 97.5 F (36.4 C) Resp: 16 BP: 113/63 SpO2: 96 %  EBL: None  Complications: No immediate post-treatment complications observed by team, or reported by patient.  Note: The patient tolerated the entire procedure well. A repeat set of vitals were taken after the procedure and the patient was kept under observation following institutional policy, for this type of procedure. Post-procedural neurological assessment was performed, showing return to baseline, prior to discharge. The patient was provided with post-procedure discharge instructions, including a section on how to identify potential problems. Should any problems arise concerning this procedure, the patient was given instructions to immediately contact us, at any time, without hesitation. In any case, we plan to contact the patient by telephone for a follow-up status report regarding this interventional procedure.  Comments:  No additional relevant information.  Plan of care  Chronic Opioid Analgesic:  None MME/day: 0 mg/day   Medications administered: We administered pentafluoroprop-tetrafluoroeth, lidocaine (PF), lidocaine HCl (PF), methylPREDNISolone acetate, ropivacaine (PF) 2 mg/mL (0.2%), methylPREDNISolone acetate, and ropivacaine (PF) 2 mg/mL (0.2%).  Follow-up plan:   Return in about 2 weeks (around 05/29/2022) for Proc-day (T,Th), (Face2F), (PPE).      Interventional Therapies  Risk Factors  Considerations:  Language barrier: [place] (interpreter required)  IDDM  CAD  Hx. MI  (B) Carotis A. Stenosis  HTN  AoV stenosis  Mitral & Tricuspid V. insufficiency  OSA   Planned  Pending:   Diagnostic/therapeutic bilateral IA steroid knee injection  #1  One-time prescription for treatment of vitamin D deficiency.    Under consideration:   Diagnostic/therapeutic bilateral IA steroid knee injection #1  Completed:   None at this time   Completed by other providers:   None at this time   Therapeutic  Palliative (PRN) options:   None established        Recent Visits Date Type Provider Dept  05/07/22 Office Visit Delano Metz, MD [facility]  04/09/22 Office Visit Delano Metz, MD [facility]  Showing recent visits within past 90 days and meeting all other requirements Today's Visits Date Type Provider Dept  05/15/22 Procedure visit Delano Metz, MD [facility]  Showing today's visits and meeting all other requirements Future Appointments Date Type Provider Dept  05/31/22 Appointment Delano Metz, MD [facility]  Showing future appointments within next 90 days and meeting all other requirements   Disposition: Discharge home  Discharge (Date  Time): 05/15/2022; 1145 hrs.   Primary Care Physician: [facility] Location: [facility] Note by: Oswaldo Done, MD Date: 05/15/2022; Time: 12:01 PM  DISCLAIMER: Medicine is not an Visual merchandiser. It has no guarantees or warranties. The decision to proceed with this intervention was based on the information collected from the patient. Conclusions were drawn from the patient's questionnaire, interview, and examination. Because information was provided in large part by the patient, it cannot be guaranteed that it has not been purposely or unconsciously manipulated or altered. Every effort has been made to obtain as much accurate, relevant, available data as possible. Always take into account that the treatment will also be dependent on availability of resources and existing treatment guidelines, considered by other Pain Management Specialists as being common knowledge  and practice, at the time of the intervention. It is also important to point out that variation in procedural techniques and pharmacological choices are the acceptable norm. For Medico-Legal review purposes, the indications, contraindications, technique, and results of the these procedures should only be evaluated, judged and interpreted by a Board-Certified Interventional Pain Specialist with extensive familiarity and expertise in the same exact procedure and technique.

## 2022-05-15 NOTE — Progress Notes (Signed)
Safety precautions to be maintained throughout the outpatient stay will include: orient to surroundings, keep bed in low position, maintain call bell within reach at all times, provide assistance with transfer out of bed and ambulation.  

## 2022-05-16 ENCOUNTER — Telehealth: Payer: Self-pay | Admitting: *Deleted

## 2022-05-16 NOTE — Telephone Encounter (Signed)
Spoke with patient's son, reports no problems post procedure.

## 2022-05-30 NOTE — Progress Notes (Unsigned)
PROVIDER NOTE: Information contained herein reflects review and annotations entered in association with encounter. Interpretation of such information and data should be left to medically-trained personnel. Information provided to patient can be located elsewhere in the medical record under "Patient Instructions". Document created using STT-dictation technology, any transcriptional errors that may result from process are unintentional.    Patient: Adrian Kim  Service Category: E/M  Provider: Oswaldo Done, MD  DOB: 01-17-1941  DOS: 05/31/2022  Referring Provider: Center, Darcella Gasman*  MRN: [id number]  Specialty: Interventional Pain Management  PCP: [facility]  Type: Established Patient  Setting: Ambulatory outpatient    Location: Office  Delivery: Face-to-face     HPI  [name], a 82 y.o. year old male, is here today because of his No primary diagnosis found.. [name] primary complain today is No chief complaint on file.  Pertinent problems: 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 Pain Assessment: Severity of   is reported as a  /10. Location:    / . Onset:  . Quality:  . Timing:  . Modifying factor(s):  Marland Kitchen Vitals:  vitals were not taken for this visit.  BMI: Estimated body mass index is 28.84 kg/m as calculated from the following:   Height as of 05/15/22: 5\' 5"  (1.651 m).   Weight as of 05/15/22: 173 lb 4.8 oz (78.6 kg). Last encounter: 05/07/2022. Last procedure: 05/15/2022.  Reason for encounter: post-procedure evaluation and assessment. ***  Post-procedure evaluation   Type: Steroid Intra-articular Knee Injection #1  Laterality: Bilateral (-50) Level/approach: Lateral Imaging guidance: None required ([id number]) Anesthesia: Local anesthesia (1-2% Lidocaine) Anxiolysis: None                 Sedation: No Sedation                       DOS: 05/15/2022  Performed by: Oswaldo Done, MD  Purpose: Diagnostic/Therapeutic Indications: Knee arthralgia associated to osteoarthritis of the knee 1. Chronic knee pain (1ry area of Pain) (Bilateral)   2. Primary osteoarthritis of knees (Bilateral)   3. Tricompartment osteoarthritis of knee (Right)    NAS-11 score:   Pre-procedure: 0-No pain/10   Post-procedure: 0-No pain/10   Note: Electronic translator available during the entire procedure.    Effectiveness:  Initial hour after procedure:   ***. Subsequent 4-6 hours post-procedure:   ***. Analgesia past initial 6 hours:   ***. Ongoing improvement:  Analgesic:  *** Function:    ***    ROM:    ***     Pharmacotherapy Assessment  Analgesic: None MME/day: 0 mg/day   Monitoring: [place] PMP: PDMP reviewed during this encounter.  Pharmacotherapy: No side-effects or adverse reactions reported. Compliance: No problems identified. Effectiveness: Clinically acceptable.  No notes on file  No results  found for: "CBDTHCR" No results found for: "D8THCCBX" No results found for: "D9THCCBX"  UDS:  Summary  Date Value Ref Range Status  04/09/2022 Note  Final    Comment:    ==================================================================== Compliance Drug Analysis, Ur ==================================================================== Test                             Result       Flag       Units  Drug Present not Declared for Prescription Verification   Alcohol, Ethyl                 0.048        UNEXPECTED g/dL    Sources of ethyl alcohol include alcoholic beverages or as a    fermentation product of glucose; glucose is present in this specimen.    Interpret result with caution, as the presence of ethyl alcohol is    likely due, at least in part, to fermentation of glucose.  Drug Absent but Declared for Prescription Verification   Acetaminophen                  Not Detected UNEXPECTED    Acetaminophen, as indicated in the declared medication list, is not    always detected even when used as directed.    Salicylate                     Not Detected UNEXPECTED    Aspirin, as indicated in the declared medication list, is not always    detected even when used as directed.    Ibuprofen                      Not Detected UNEXPECTED    Ibuprofen, as indicated in the declared medication list, is not    always detected even when used as directed.  ==================================================================== Test                      Result    Flag   Units      Ref Range   Creatinine              39               mg/dL      >=16 ==================================================================== Declared Medications:  The flagging and interpretation on this report are based on the  following declared medications.  Unexpected results may arise from  inaccuracies in the declared medications.   **Note: The testing scope of this panel does not include small to  moderate amounts  of these reported medications:   Acetaminophen (Tylenol)  Aspirin  Ibuprofen   **Note: The testing scope of this panel does not include the  following reported medications:   Betamethasone  Canagliflozin (Invokana)  Cetirizine (Zyrtec)  Dorzolamide  Fluticasone  Latanoprost (Xalatan)  Linagliptin (Tradjenta)  Lisinopril (Zestril)  Metformin  Rosuvastatin (Crestor)  Semaglutide (Rybelsus)  Sitagliptin (Januvia)  Tamsulosin (Flomax)  Timolol (Timoptic) ==================================================================== For clinical consultation, please call [phone]. ====================================================================       ROS  Constitutional: Denies any fever or chills Gastrointestinal: No reported hemesis, hematochezia, vomiting, or acute GI distress Musculoskeletal: Denies any acute onset joint swelling, redness, loss of ROM, or weakness Neurological: No reported episodes of acute  onset apraxia, aphasia, dysarthria, agnosia, amnesia, paralysis, loss of coordination, or loss of consciousness  Medication Review  Cholecalciferol, Semaglutide, TRUEplus Lancets 28G, acetaminophen, aspirin EC, betamethasone valerate ointment, canagliflozin, cetirizine, dorzolamide-timolol, ergocalciferol, fluticasone, glucose blood, ibuprofen, latanoprost, linagliptin, lisinopril, metFORMIN, rosuvastatin, sitaGLIPtin, tamsulosin, and timolol  History Review  Allergy: Mr. Beckmann is allergic to zolpidem. Drug: Mr. Bamonte  reports no history of drug use. Alcohol:  reports no history of alcohol use. Tobacco:  reports that he has quit smoking. He has never used smokeless tobacco. Social: Mr. Flexer  reports that he has quit smoking. He has never used smokeless tobacco. He reports that he does not drink alcohol and does not use drugs. Medical:  has a past medical history of Arthritis, Diabetes (HCC), Glaucoma, Heart attack (HCC), HLD (hyperlipidemia), HTN (hypertension),  Lower back pain, Sleep apnea, and Tinnitus. Surgical: Mr. Fitton  has a past surgical history that includes Coronary angioplasty with stent; Colonoscopy with propofol (N/A, 12/10/2014); and Cataract extraction w/PHACO (Right, 11/16/2015). Family: family history includes Leukemia in his son.  Laboratory Chemistry Profile   Renal Lab Results  Component Value Date   BUN 14 04/09/2022   CREATININE 0.80 04/09/2022   GFRNONAA >60 04/09/2022    Hepatic Lab Results  Component Value Date   AST 16 04/09/2022   ALT 12 04/09/2022   ALBUMIN 4.4 04/09/2022   ALKPHOS 57 04/09/2022    Electrolytes Lab Results  Component Value Date   NA 136 04/09/2022   K 4.4 04/09/2022   CL 103 04/09/2022   CALCIUM 9.2 04/09/2022   MG 2.1 04/09/2022    Bone Lab Results  Component Value Date   25OHVITD1 6.8 (L) 04/09/2022   25OHVITD2 <1.0 04/09/2022   25OHVITD3 6.8 04/09/2022    Inflammation (CRP: Acute Phase) (ESR: Chronic Phase) Lab Results  Component Value Date   CRP 0.5 04/09/2022   ESRSEDRATE 4 04/09/2022         Note: Above Lab results reviewed.  Recent Imaging Review  DG Ribs Unilateral Right CLINICAL DATA:  Bilateral rib pain status post trauma. Chronic pain.  EXAM: LEFT RIBS - 2 VIEW; RIGHT RIBS - 2 VIEW  COMPARISON:  None Available.  FINDINGS: Left ribs: No acute or evidence of healing/healed fracture. No focal bone lesion, bony destructive change or evidence of focal lesion. The included left lung is clear.  Right ribs: No acute or evidence of healing/healed fracture. No focal bone lesion, bony destructive change or evidence of focal lesion. Costocartilage calcification of the lower ribs, benign. The included right lung is clear.  IMPRESSION: Negative radiographs of the right and left ribs.  Electronically Signed   By: Narda Rutherford M.D.   On: 04/11/2022 15:29 DG Ribs Unilateral Left CLINICAL DATA:  Bilateral rib pain status post trauma. Chronic pain.  EXAM: LEFT  RIBS - 2 VIEW; RIGHT RIBS - 2 VIEW  COMPARISON:  None Available.  FINDINGS: Left ribs: No acute or evidence of healing/healed fracture. No focal bone lesion, bony destructive change or evidence of focal lesion. The included left lung is clear.  Right ribs: No acute or evidence of healing/healed fracture. No focal bone lesion, bony destructive change or evidence of focal lesion. Costocartilage calcification of the lower ribs, benign. The included right lung is clear.  IMPRESSION: Negative radiographs of the right and left ribs.  Electronically Signed   By: Narda Rutherford M.D.   On: 04/11/2022 15:29 DG Foot Complete Left CLINICAL DATA:  Chronic bilateral foot pain. Chronic pain of lower  extremities.  EXAM: LEFT FOOT - COMPLETE 3+ VIEW  COMPARISON:  None Available.  FINDINGS: Moderate hallux valgus. Hammertoe deformity of the second toe. Minimal joint space narrowing and spurring at the first metatarsal phalangeal joint. No fracture. Small plantar calcaneal spur. No erosion or periostitis. There are vascular calcifications.  IMPRESSION: 1. Moderate hallux valgus with mild osteoarthritis of the first metatarsophalangeal joint. 2. Hammertoe deformity of the second toe. 3. Small plantar calcaneal spur.  Electronically Signed   By: Narda Rutherford M.D.   On: 04/11/2022 15:27 DG Foot Complete Right CLINICAL DATA:  Chronic pain of lower extremity, bilateral. Chronic bilateral foot pain.  EXAM: RIGHT FOOT COMPLETE - 3+ VIEW  COMPARISON:  None Available.  FINDINGS: Slight hammertoe deformity of the fourth and fifth digits. Alignment is otherwise normal. Moderate joint space narrowing and spurring at the first metatarsal phalangeal joint. Calcified spur arises from the lateral aspect of the third metacarpal phalangeal joint that may be degenerative or sequela of remote injury. Small plantar calcaneal spur. No erosion or periostitis. Vascular calcifications are  seen.  IMPRESSION: 1. Slight hammertoe deformity of the fourth and fifth digits. 2. Moderate osteoarthritis at the first metatarsophalangeal joint. No evidence of inflammatory arthropathy. 3. Small plantar calcaneal spur.  Electronically Signed   By: Narda Rutherford M.D.   On: 04/11/2022 15:26 DG HIP UNILAT W OR W/O PELVIS 2-3 VIEWS LEFT CLINICAL DATA:  Chronic bilateral hip pain. Chronic pain of bilateral lower extremities.  EXAM: DG HIP (WITH OR WITHOUT PELVIS) 2-3V LEFT  COMPARISON:  None Available.  FINDINGS: Mild left hip joint space narrowing with acetabular spurring. Femoral head is well seated in the acetabulum. No evidence of fracture. No avascular necrosis or bony destructive change. Intact pubic rami. Peripheral vascular calcifications.  IMPRESSION: Mild osteoarthritis of the left hip.  Electronically Signed   By: Narda Rutherford M.D.   On: 04/11/2022 15:24 DG Hand Complete Right CLINICAL DATA:  Chronic bilateral hand pain.  EXAM: RIGHT HAND - COMPLETE 3+ VIEW  COMPARISON:  Report from index finger radiograph 04/12/2004  FINDINGS: Mild subjective bony under mineralization. Small osteophytes throughout the digits at distal and proximal interphalangeal joints, as well as the metacarpal phalangeal joints. Mild spurring at the thumb carpal metacarpal joint and thumb interphalangeal joint. Remote posttraumatic deformity of the index finger distal tuft. No acute fracture. No erosion or periostitis. There are vascular calcifications.  IMPRESSION: 1. Mild osteoarthritis of the digits. No evidence of inflammatory arthropathy. 2. Remote posttraumatic deformity of the index finger distal tuft.  Electronically Signed   By: Narda Rutherford M.D.   On: 04/11/2022 15:23 DG Hand Complete Left CLINICAL DATA:  Chronic bilateral hand pain.  EXAM: LEFT HAND - COMPLETE 3+ VIEW  COMPARISON:  None Available.  FINDINGS: Mild subjective bony under mineralization.  Alignment is normal. Small osteophytes throughout the digits at the distal and proximal interphalangeal joints as well as thumb interphalangeal joint. Mild spurring at the metacarpal phalangeal joints. Mild joint space narrowing and spurring at the thumb carpal metacarpal joint. No erosions or periostitis. No fracture. No focal bone abnormality. There are vascular calcifications.  IMPRESSION: 1. Mild osteoarthritis of the digits and thumb carpometacarpal joint. No evidence of inflammatory arthropathy. 2. Vascular calcifications.  Electronically Signed   By: Narda Rutherford M.D.   On: 04/11/2022 15:21 DG Lumbar Spine Complete W/Bend CLINICAL DATA:  Chronic bilateral low back pain without sciatica. Chronic pain of lower extremity, bilateral.  EXAM: LUMBAR SPINE - COMPLETE WITH BENDING VIEWS  COMPARISON:  None Available.  FINDINGS: There are 5 non-rib-bearing lumbar vertebra. Mild broad-based levo scoliotic curvature at the thoracolumbar junction. Straightening of normal lordosis. No listhesis. No abnormal motion on flexion or extension. There is moderate diffuse disc space narrowing and spurring from L1-L2 through L5-S1. Mild multilevel facet hypertrophy, most prominent at L3-L4 and L5-S1. Normal vertebral body heights. No evidence of fracture, bony destructive change or focal lesion. No visible pars defects. Aortic atherosclerosis.  IMPRESSION: 1. Mild broad-based levo scoliotic curvature at the thoracolumbar junction. 2. Moderate diffuse degenerative disc disease. 3. Mild facet arthropathy at L3-L4 and L5-S1.  Electronically Signed   By: Narda Rutherford M.D.   On: 04/11/2022 15:20 DG Cervical Spine With Flex & Extend CLINICAL DATA:  Chronic neck pain. Cervicalgia.  EXAM: CERVICAL SPINE COMPLETE WITH FLEXION AND EXTENSION VIEWS  COMPARISON:  None Available.  FINDINGS: Straightening of normal lordosis. 2 mm retrolisthesis of C3 on C4, unchanged on flexion and  extension. No evidence of instability. Disc space narrowing with anterior spurring C2-C3, C5-C6, and C6-C7, moderate in degree. There is mild multilevel facet hypertrophy. Lateral masses of C1 well aligned on C2. No evidence of fracture, focal bone lesion or bony destructive change. No prevertebral soft tissue thickening.  IMPRESSION: 1. Straightening of normal lordosis. 2. Degenerative disc disease C2-C3, C5-C6, and C6-C7, moderate in degree. Mild multilevel facet hypertrophy. 3. Grade 1 retrolisthesis of C3 on C4, unchanged on flexion and extension.  Electronically Signed   By: Narda Rutherford M.D.   On: 04/11/2022 15:18 DG Knee Complete 4 Views Left CLINICAL DATA:  Chronic bilateral knee pain. Chronic pain of lower extremity, bilateral. Left knee pain/arthralgia.  EXAM: LEFT KNEE - COMPLETE 4+ VIEW  COMPARISON:  No prior left knee exams.  FINDINGS: Mild medial tibiofemoral joint space narrowing. Patellar [place]. Tibiofemoral peripheral spurring is most prominent in the medial tibiofemoral compartment. No fracture. No erosion, periostitis or focal bone abnormalities. No large knee joint effusion. Vascular calcifications. Otherwise unremarkable soft tissues.  IMPRESSION: 1. Mild osteoarthritis most prominent in the medial tibiofemoral compartment. 2. Mild patellar [place].  Electronically Signed   By: Narda Rutherford M.D.   On: 04/11/2022 15:15 DG Knee Complete 4 Views Right CLINICAL DATA:  Pain.  EXAM: RIGHT KNEE - COMPLETE 4+ VIEW  COMPARISON:  None Available.  FINDINGS: Tricompartmental degenerative changes, most prominent in the medial compartment. No fracture or dislocation. No bony lesions. No significant joint effusion. Vascular calcifications in the posterior vasculature.  IMPRESSION: Tricompartmental degenerative changes, most prominent in the medial compartment.  Electronically Signed   By: Gerome Sam III M.D.   On: 04/11/2022 14:30 Note:  Reviewed        Physical Exam  General appearance: Well nourished, well developed, and well hydrated. In no apparent acute distress Mental status: Alert, oriented x 3 (person, place, & time)       Respiratory: No evidence of acute respiratory distress Eyes: PERLA Vitals: There were no vitals taken for this visit. BMI: Estimated body mass index is 28.84 kg/m as calculated from the following:   Height as of 05/15/22: 5\' 5"  (1.651 m).   Weight as of 05/15/22: 173 lb 4.8 oz (78.6 kg). Ideal: Patient weight not recorded  Assessment   Diagnosis Status  No diagnosis found. Controlled Controlled Controlled   Updated Problems: No problems updated.  Plan of Care  Problem-specific:  No problem-specific Assessment & Plan notes found for this encounter.  Mr. Chia Wheeley has a current medication list which includes  the following long-term medication(s): cetirizine, fluticasone, linagliptin, lisinopril, metformin, rosuvastatin, and sitagliptin.  Pharmacotherapy (Medications Ordered): No orders of the defined types were placed in this encounter.  Orders:  No orders of the defined types were placed in this encounter.  Follow-up plan:   No follow-ups on file.      Interventional Therapies  Risk Factors  Considerations:  Language barrier: [place] (interpreter required)  IDDM  CAD  Hx. MI  (B) Carotis A. Stenosis  HTN  AoV stenosis  Mitral & Tricuspid V. insufficiency  OSA   Planned  Pending:   Diagnostic/therapeutic bilateral IA steroid knee injection #1  One-time prescription for treatment of vitamin D deficiency.    Under consideration:   Diagnostic/therapeutic bilateral IA steroid knee injection #1    Completed:   None at this time   Completed by other providers:   None at this time   Therapeutic  Palliative (PRN) options:   None established          Recent Visits Date Type Provider Dept  05/15/22 Procedure visit Delano Metz, MD [facility]  05/07/22 Office Visit Delano Metz, MD [facility]  04/09/22 Office Visit Delano Metz, MD [facility]  Showing recent visits within past 90 days and meeting all other requirements Future Appointments Date Type Provider Dept  05/31/22 Appointment Delano Metz, MD [facility]  Showing future appointments within next 90 days and meeting all other requirements  I discussed the assessment and treatment plan with the patient. The patient was provided an opportunity to ask questions and all were answered. The patient agreed with the plan and demonstrated an understanding of the instructions.  Patient advised to call back or seek an in-person evaluation if the symptoms or condition worsens.  Duration of encounter: *** minutes.  Total time on encounter, as per AMA guidelines included both the face-to-face and non-face-to-face time personally spent by the physician and/or other qualified health care professional(s) on the day of the encounter (includes time in activities that require the physician or other qualified health care professional and does not include time in activities normally performed by clinical staff). Physician's time may include the following activities when performed: Preparing to see the patient (e.g., pre-charting review of records, searching for previously ordered imaging, lab work, and nerve conduction tests) Review of prior analgesic pharmacotherapies. Reviewing PMP Interpreting ordered tests (e.g., lab work, imaging, nerve conduction tests) Performing post-procedure evaluations, including interpretation of diagnostic procedures Obtaining and/or reviewing separately obtained history Performing a medically appropriate examination and/or evaluation Counseling and educating the patient/family/caregiver Ordering medications, tests, or procedures Referring and communicating with other health care professionals (when not  separately reported) Documenting clinical information in the electronic or other health record Independently interpreting results (not separately reported) and communicating results to the patient/ family/caregiver Care coordination (not separately reported)  Note by: Oswaldo Done, MD Date: 05/31/2022; Time: 6:41 AM

## 2022-05-31 ENCOUNTER — Ambulatory Visit: Payer: Medicare Other | Attending: Pain Medicine | Admitting: Pain Medicine

## 2022-05-31 ENCOUNTER — Encounter: Payer: Self-pay | Admitting: Pain Medicine

## 2022-05-31 VITALS — BP 103/60 | HR 91 | Temp 97.2°F | Resp 16 | Ht 65.5 in | Wt 170.3 lb

## 2022-05-31 DIAGNOSIS — M25561 Pain in right knee: Secondary | ICD-10-CM | POA: Diagnosis present

## 2022-05-31 DIAGNOSIS — M25562 Pain in left knee: Secondary | ICD-10-CM | POA: Insufficient documentation

## 2022-05-31 DIAGNOSIS — M47816 Spondylosis without myelopathy or radiculopathy, lumbar region: Secondary | ICD-10-CM | POA: Diagnosis present

## 2022-05-31 DIAGNOSIS — G8929 Other chronic pain: Secondary | ICD-10-CM | POA: Diagnosis present

## 2022-05-31 DIAGNOSIS — Z09 Encounter for follow-up examination after completed treatment for conditions other than malignant neoplasm: Secondary | ICD-10-CM | POA: Insufficient documentation

## 2022-05-31 DIAGNOSIS — M170 Bilateral primary osteoarthritis of knee: Secondary | ICD-10-CM | POA: Diagnosis present

## 2022-05-31 DIAGNOSIS — M1711 Unilateral primary osteoarthritis, right knee: Secondary | ICD-10-CM | POA: Insufficient documentation

## 2022-05-31 DIAGNOSIS — M5137 Other intervertebral disc degeneration, lumbosacral region: Secondary | ICD-10-CM | POA: Insufficient documentation

## 2022-05-31 DIAGNOSIS — M5442 Lumbago with sciatica, left side: Secondary | ICD-10-CM | POA: Insufficient documentation

## 2022-05-31 DIAGNOSIS — M79605 Pain in left leg: Secondary | ICD-10-CM | POA: Insufficient documentation

## 2022-05-31 DIAGNOSIS — R2689 Other abnormalities of gait and mobility: Secondary | ICD-10-CM | POA: Insufficient documentation

## 2022-05-31 DIAGNOSIS — M5441 Lumbago with sciatica, right side: Secondary | ICD-10-CM | POA: Insufficient documentation

## 2022-05-31 DIAGNOSIS — M79604 Pain in right leg: Secondary | ICD-10-CM | POA: Diagnosis present

## 2022-05-31 DIAGNOSIS — R262 Difficulty in walking, not elsewhere classified: Secondary | ICD-10-CM | POA: Diagnosis present

## 2022-05-31 NOTE — Progress Notes (Signed)
Safety precautions to be maintained throughout the outpatient stay will include: orient to surroundings, keep bed in low position, maintain call bell within reach at all times, provide assistance with transfer out of bed and ambulation.  

## 2022-06-13 ENCOUNTER — Other Ambulatory Visit: Payer: Self-pay | Admitting: Pain Medicine

## 2022-06-13 DIAGNOSIS — E559 Vitamin D deficiency, unspecified: Secondary | ICD-10-CM

## 2022-07-12 ENCOUNTER — Ambulatory Visit (HOSPITAL_BASED_OUTPATIENT_CLINIC_OR_DEPARTMENT_OTHER): Payer: Medicare Other | Admitting: Student in an Organized Health Care Education/Training Program

## 2022-07-12 DIAGNOSIS — M25562 Pain in left knee: Secondary | ICD-10-CM

## 2022-07-12 DIAGNOSIS — M25561 Pain in right knee: Secondary | ICD-10-CM

## 2022-07-12 DIAGNOSIS — G8929 Other chronic pain: Secondary | ICD-10-CM

## 2022-07-17 NOTE — Progress Notes (Signed)
Dr Laban Emperor pt incorrectly scheduled with me

## 2022-07-19 ENCOUNTER — Ambulatory Visit: Payer: Medicare Other | Admitting: Pain Medicine

## 2022-07-26 ENCOUNTER — Ambulatory Visit
Payer: Medicare Other | Attending: Student in an Organized Health Care Education/Training Program | Admitting: Pain Medicine

## 2022-07-26 VITALS — BP 100/58 | HR 85 | Temp 98.5°F | Resp 18 | Ht 68.0 in | Wt 170.0 lb

## 2022-07-26 DIAGNOSIS — M79605 Pain in left leg: Secondary | ICD-10-CM | POA: Insufficient documentation

## 2022-07-26 DIAGNOSIS — M25561 Pain in right knee: Secondary | ICD-10-CM | POA: Diagnosis not present

## 2022-07-26 DIAGNOSIS — Z7985 Long-term (current) use of injectable non-insulin antidiabetic drugs: Secondary | ICD-10-CM

## 2022-07-26 DIAGNOSIS — M25562 Pain in left knee: Secondary | ICD-10-CM | POA: Diagnosis not present

## 2022-07-26 DIAGNOSIS — M79604 Pain in right leg: Secondary | ICD-10-CM | POA: Diagnosis not present

## 2022-07-26 DIAGNOSIS — M1711 Unilateral primary osteoarthritis, right knee: Secondary | ICD-10-CM | POA: Insufficient documentation

## 2022-07-26 DIAGNOSIS — M170 Bilateral primary osteoarthritis of knee: Secondary | ICD-10-CM | POA: Insufficient documentation

## 2022-07-26 DIAGNOSIS — E1142 Type 2 diabetes mellitus with diabetic polyneuropathy: Secondary | ICD-10-CM | POA: Diagnosis present

## 2022-07-26 DIAGNOSIS — G8929 Other chronic pain: Secondary | ICD-10-CM | POA: Insufficient documentation

## 2022-07-26 DIAGNOSIS — E1140 Type 2 diabetes mellitus with diabetic neuropathy, unspecified: Secondary | ICD-10-CM | POA: Diagnosis present

## 2022-07-26 NOTE — Progress Notes (Signed)
Safety precautions to be maintained throughout the outpatient stay will include: orient to surroundings, keep bed in low position, maintain call bell within reach at all times, provide assistance with transfer out of bed and ambulation.  

## 2022-07-26 NOTE — Progress Notes (Signed)
PROVIDER NOTE: Information contained herein reflects review and annotations entered in association with encounter. Interpretation of such information and data should be left to medically-trained personnel. Information provided to patient can be located elsewhere in the medical record under "Patient Instructions". Document created using STT-dictation technology, any transcriptional errors that may result from process are unintentional.    Patient: Adrian Kim  Service Category: E/M  Provider: Oswaldo Done, MD  DOB: 03-20-1940  DOS: 07/26/2022  Referring Provider: [facility], [facility]*  MRN: [id number]  Specialty: Interventional Pain Management  PCP: [facility]  Type: Established Patient  Setting: Ambulatory outpatient    Location: Office  Delivery: Face-to-face     HPI  [name], a 82 y.o. year old male, is here today because of his Bilateral chronic knee pain [M25.561, M25.562, G89.29]. [name] primary complain today is Knee Pain (Left and right)  Pertinent problems: 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 Pain Assessment: Severity of Chronic pain is reported as a 10-Worst pain ever (10/10 on inside 8/10 ouside knee)/10. Location: Knee  / . Onset: More than a month ago. Quality: Aching, Burning, Discomfort, Sharp, Shooting, Sore, Restless. Timing: Constant. Modifying factor(s): rest, lying down. Vitals:  height is 5\' 8"  (1.727 m) and weight is 170 lb (77.1 kg). His temperature is 98.5 F (36.9 C). His blood pressure is 100/58 (abnormal) and his pulse is 85. His respiration is 18 and oxygen saturation is 99%.  BMI: Estimated body mass index is 25.85 kg/m as calculated from the following:   Height as of this encounter: 5\' 8"  (1.727 m).   Weight as of this encounter: 170 lb (77.1 kg). Last encounter: 05/31/2022. Last procedure: 05/15/2022.  Reason for encounter: evaluation of worsening, or previously known (established) problem.  Today the patient comes into the clinics with his son who served today as the interpreter.  Having the son here has helped immensely with the communication.  During today's encounter the patient indicated that he was experiencing some pain in both lower remedies with some of the pain being in the knee region with the right side being worse than the left.  Indicates of that right knee, he points of the pain being medial to the superior aspect of the patella right at the edge of the distal medial condyle, around the femoral insertion of the medial collateral ligament, possibly affecting the medial meniscus.  The patient indicates having quite a bit of pain upon standing.  However, when he is sitting he is not really experiencing any pain which would indicate that the triggering factor  is to pressure within the  knee joint.  Prior x-rays of both knees demonstrated the patient to have osteoarthritis with the right knee being a lot worse than the left and demonstrating tricompartmental osteoarthritis.  At this point, due to the fact that his pain persists, despite conservative therapy, I believe it to be necessary to proceed with an MRI of the right knee.  In addition to this, today we have talked about pain that he is experiencing in both of his feet.  Thanks to the interpretation of the patient's son, now we know that this is a burning sensation that he is experiencing around the entire foot, bilaterally, without any particular peripheral nerve distribution.  Review of the medical records revealed that the patient has a longstanding history of non-insulin-dependent type 2 diabetes.  Physical exam reveals mottling changes of his skin that are more significant as you go closer to his feet.  He also has no hair in his lower extremities.  And based on the patient's description of this pain it is very likely that he is experiencing a bilateral painful diabetic neuropathy.  He denies ever having had any nerve conduction test of the lower extremities.  For this reason today I will be also ordering a nerve conduction test of the lower extremities to assess his case for a possible diabetic peripheral neuropathy.  I have instructed them to return after having completed both test at which time we will go over those results and depending on those, we may have to significantly changed his therapy.  Pharmacotherapy Assessment  Analgesic: None MME/day: 0 mg/day   Monitoring: [place] PMP: PDMP reviewed during this encounter.       Pharmacotherapy: No side-effects or adverse reactions reported. Compliance: No problems identified. Effectiveness: Clinically acceptable.  Adrian Pies, RN  07/26/2022  3:04 PM  Sign when Signing Visit Safety precautions to be maintained throughout the outpatient stay will  include: orient to surroundings, keep bed in low position, maintain call bell within reach at all times, provide assistance with transfer out of bed and ambulation.     No results found for: "CBDTHCR" No results found for: "D8THCCBX" No results found for: "D9THCCBX"  UDS:  Summary  Date Value Ref Range Status  04/09/2022 Note  Final    Comment:    ==================================================================== Compliance Drug Analysis, Ur ==================================================================== Test                             Result       Flag       Units  Drug Present not Declared for Prescription Verification   Alcohol, Ethyl                 0.048        UNEXPECTED g/dL    Sources of ethyl alcohol include alcoholic beverages or as a    fermentation product of glucose; glucose is present in this specimen.    Interpret result with caution, as the presence of ethyl alcohol is    likely due, at least in part, to fermentation of glucose.  Drug Absent but Declared for Prescription Verification   Acetaminophen                  Not Detected UNEXPECTED    Acetaminophen, as indicated in the declared medication list, is not    always detected even when used as directed.    Salicylate  Not Detected UNEXPECTED    Aspirin, as indicated in the declared medication list, is not always    detected even when used as directed.    Ibuprofen                      Not Detected UNEXPECTED    Ibuprofen, as indicated in the declared medication list, is not    always detected even when used as directed.  ==================================================================== Test                      Result    Flag   Units      Ref Range   Creatinine              39               mg/dL      >=32 ==================================================================== Declared Medications:  The flagging and interpretation on this report are based on the  following declared  medications.  Unexpected results may arise from  inaccuracies in the declared medications.   **Note: The testing scope of this panel does not include small to  moderate amounts of these reported medications:   Acetaminophen (Tylenol)  Aspirin  Ibuprofen   **Note: The testing scope of this panel does not include the  following reported medications:   Betamethasone  Canagliflozin (Invokana)  Cetirizine (Zyrtec)  Dorzolamide  Fluticasone  Latanoprost (Xalatan)  Linagliptin (Tradjenta)  Lisinopril (Zestril)  Metformin  Rosuvastatin (Crestor)  Semaglutide (Rybelsus)  Sitagliptin (Januvia)  Tamsulosin (Flomax)  Timolol (Timoptic) ==================================================================== For clinical consultation, please call [phone]. ====================================================================       ROS  Constitutional: Denies any fever or chills Gastrointestinal: No reported hemesis, hematochezia, vomiting, or acute GI distress Musculoskeletal: Denies any acute onset joint swelling, redness, loss of ROM, or weakness Neurological: No reported episodes of acute onset apraxia, aphasia, dysarthria, agnosia, amnesia, paralysis, loss of coordination, or loss of consciousness  Medication Review  Semaglutide, TRUEplus Lancets 28G, Vitamin D3, acetaminophen, aspirin EC, betamethasone valerate ointment, canagliflozin, cetirizine, dorzolamide-timolol, fluticasone, glucose blood, ibuprofen, latanoprost, linagliptin, lisinopril, metFORMIN, rosuvastatin, sitaGLIPtin, tamsulosin, and timolol  History Review  Allergy: Mr. Stefka is allergic to zolpidem. Drug: Mr. Schoene  reports no history of drug use. Alcohol:  reports no history of alcohol use. Tobacco:  reports that he has quit smoking. He has never used smokeless tobacco. Social: Mr. Marceleno  reports that he has quit smoking. He has never used smokeless tobacco. He reports that he does not drink alcohol and  does not use drugs. Medical:  has a past medical history of Arthritis, Diabetes (HCC), Glaucoma, Heart attack (HCC), HLD (hyperlipidemia), HTN (hypertension), Lower back pain, Sleep apnea, and Tinnitus. Surgical: Mr. Fullenkamp  has a past surgical history that includes Coronary angioplasty with stent; Colonoscopy with propofol (N/A, 12/10/2014); and Cataract extraction w/PHACO (Right, 11/16/2015). Family: family history includes Leukemia in his son.  Laboratory Chemistry Profile   Renal Lab Results  Component Value Date   BUN 14 04/09/2022   CREATININE 0.80 04/09/2022   GFRNONAA >60 04/09/2022    Hepatic Lab Results  Component Value Date   AST 16 04/09/2022   ALT 12 04/09/2022   ALBUMIN 4.4 04/09/2022   ALKPHOS 57 04/09/2022    Electrolytes Lab Results  Component Value Date   NA 136 04/09/2022   K 4.4 04/09/2022   CL 103 04/09/2022   CALCIUM 9.2 04/09/2022   MG 2.1 04/09/2022    Bone Lab  Results  Component Value Date   25OHVITD1 6.8 (L) 04/09/2022   25OHVITD2 <1.0 04/09/2022   25OHVITD3 6.8 04/09/2022    Inflammation (CRP: Acute Phase) (ESR: Chronic Phase) Lab Results  Component Value Date   CRP 0.5 04/09/2022   ESRSEDRATE 4 04/09/2022         Note: Above Lab results reviewed.  Recent Imaging Review  DG Ribs Unilateral Right CLINICAL DATA:  Bilateral rib pain status post trauma. Chronic pain.  EXAM: LEFT RIBS - 2 VIEW; RIGHT RIBS - 2 VIEW  COMPARISON:  None Available.  FINDINGS: Left ribs: No acute or evidence of healing/healed fracture. No focal bone lesion, bony destructive change or evidence of focal lesion. The included left lung is clear.  Right ribs: No acute or evidence of healing/healed fracture. No focal bone lesion, bony destructive change or evidence of focal lesion. Costocartilage calcification of the lower ribs, benign. The included right lung is clear.  IMPRESSION: Negative radiographs of the right and left ribs.  Electronically Signed    By: Narda Rutherford M.D.   On: 04/11/2022 15:29 DG Ribs Unilateral Left CLINICAL DATA:  Bilateral rib pain status post trauma. Chronic pain.  EXAM: LEFT RIBS - 2 VIEW; RIGHT RIBS - 2 VIEW  COMPARISON:  None Available.  FINDINGS: Left ribs: No acute or evidence of healing/healed fracture. No focal bone lesion, bony destructive change or evidence of focal lesion. The included left lung is clear.  Right ribs: No acute or evidence of healing/healed fracture. No focal bone lesion, bony destructive change or evidence of focal lesion. Costocartilage calcification of the lower ribs, benign. The included right lung is clear.  IMPRESSION: Negative radiographs of the right and left ribs.  Electronically Signed   By: Narda Rutherford M.D.   On: 04/11/2022 15:29 DG Foot Complete Left CLINICAL DATA:  Chronic bilateral foot pain. Chronic pain of lower extremities.  EXAM: LEFT FOOT - COMPLETE 3+ VIEW  COMPARISON:  None Available.  FINDINGS: Moderate hallux valgus. Hammertoe deformity of the second toe. Minimal joint space narrowing and spurring at the first metatarsal phalangeal joint. No fracture. Small plantar calcaneal spur. No erosion or periostitis. There are vascular calcifications.  IMPRESSION: 1. Moderate hallux valgus with mild osteoarthritis of the first metatarsophalangeal joint. 2. Hammertoe deformity of the second toe. 3. Small plantar calcaneal spur.  Electronically Signed   By: Narda Rutherford M.D.   On: 04/11/2022 15:27 DG Foot Complete Right CLINICAL DATA:  Chronic pain of lower extremity, bilateral. Chronic bilateral foot pain.  EXAM: RIGHT FOOT COMPLETE - 3+ VIEW  COMPARISON:  None Available.  FINDINGS: Slight hammertoe deformity of the fourth and fifth digits. Alignment is otherwise normal. Moderate joint space narrowing and spurring at the first metatarsal phalangeal joint. Calcified spur arises from the lateral aspect of the third metacarpal  phalangeal joint that may be degenerative or sequela of remote injury. Small plantar calcaneal spur. No erosion or periostitis. Vascular calcifications are seen.  IMPRESSION: 1. Slight hammertoe deformity of the fourth and fifth digits. 2. Moderate osteoarthritis at the first metatarsophalangeal joint. No evidence of inflammatory arthropathy. 3. Small plantar calcaneal spur.  Electronically Signed   By: Narda Rutherford M.D.   On: 04/11/2022 15:26 DG HIP UNILAT W OR W/O PELVIS 2-3 VIEWS LEFT CLINICAL DATA:  Chronic bilateral hip pain. Chronic pain of bilateral lower extremities.  EXAM: DG HIP (WITH OR WITHOUT PELVIS) 2-3V LEFT  COMPARISON:  None Available.  FINDINGS: Mild left hip joint space narrowing with acetabular  spurring. Femoral head is well seated in the acetabulum. No evidence of fracture. No avascular necrosis or bony destructive change. Intact pubic rami. Peripheral vascular calcifications.  IMPRESSION: Mild osteoarthritis of the left hip.  Electronically Signed   By: Narda Rutherford M.D.   On: 04/11/2022 15:24 DG Hand Complete Right CLINICAL DATA:  Chronic bilateral hand pain.  EXAM: RIGHT HAND - COMPLETE 3+ VIEW  COMPARISON:  Report from index finger radiograph 04/12/2004  FINDINGS: Mild subjective bony under mineralization. Small osteophytes throughout the digits at distal and proximal interphalangeal joints, as well as the metacarpal phalangeal joints. Mild spurring at the thumb carpal metacarpal joint and thumb interphalangeal joint. Remote posttraumatic deformity of the index finger distal tuft. No acute fracture. No erosion or periostitis. There are vascular calcifications.  IMPRESSION: 1. Mild osteoarthritis of the digits. No evidence of inflammatory arthropathy. 2. Remote posttraumatic deformity of the index finger distal tuft.  Electronically Signed   By: Narda Rutherford M.D.   On: 04/11/2022 15:23 DG Hand Complete Left CLINICAL DATA:   Chronic bilateral hand pain.  EXAM: LEFT HAND - COMPLETE 3+ VIEW  COMPARISON:  None Available.  FINDINGS: Mild subjective bony under mineralization. Alignment is normal. Small osteophytes throughout the digits at the distal and proximal interphalangeal joints as well as thumb interphalangeal joint. Mild spurring at the metacarpal phalangeal joints. Mild joint space narrowing and spurring at the thumb carpal metacarpal joint. No erosions or periostitis. No fracture. No focal bone abnormality. There are vascular calcifications.  IMPRESSION: 1. Mild osteoarthritis of the digits and thumb carpometacarpal joint. No evidence of inflammatory arthropathy. 2. Vascular calcifications.  Electronically Signed   By: Narda Rutherford M.D.   On: 04/11/2022 15:21 DG Lumbar Spine Complete W/Bend CLINICAL DATA:  Chronic bilateral low back pain without sciatica. Chronic pain of lower extremity, bilateral.  EXAM: LUMBAR SPINE - COMPLETE WITH BENDING VIEWS  COMPARISON:  None Available.  FINDINGS: There are 5 non-rib-bearing lumbar vertebra. Mild broad-based levo scoliotic curvature at the thoracolumbar junction. Straightening of normal lordosis. No listhesis. No abnormal motion on flexion or extension. There is moderate diffuse disc space narrowing and spurring from L1-L2 through L5-S1. Mild multilevel facet hypertrophy, most prominent at L3-L4 and L5-S1. Normal vertebral body heights. No evidence of fracture, bony destructive change or focal lesion. No visible pars defects. Aortic atherosclerosis.  IMPRESSION: 1. Mild broad-based levo scoliotic curvature at the thoracolumbar junction. 2. Moderate diffuse degenerative disc disease. 3. Mild facet arthropathy at L3-L4 and L5-S1.  Electronically Signed   By: Narda Rutherford M.D.   On: 04/11/2022 15:20 DG Cervical Spine With Flex & Extend CLINICAL DATA:  Chronic neck pain. Cervicalgia.  EXAM: CERVICAL SPINE COMPLETE WITH FLEXION AND  EXTENSION VIEWS  COMPARISON:  None Available.  FINDINGS: Straightening of normal lordosis. 2 mm retrolisthesis of C3 on C4, unchanged on flexion and extension. No evidence of instability. Disc space narrowing with anterior spurring C2-C3, C5-C6, and C6-C7, moderate in degree. There is mild multilevel facet hypertrophy. Lateral masses of C1 well aligned on C2. No evidence of fracture, focal bone lesion or bony destructive change. No prevertebral soft tissue thickening.  IMPRESSION: 1. Straightening of normal lordosis. 2. Degenerative disc disease C2-C3, C5-C6, and C6-C7, moderate in degree. Mild multilevel facet hypertrophy. 3. Grade 1 retrolisthesis of C3 on C4, unchanged on flexion and extension.  Electronically Signed   By: Narda Rutherford M.D.   On: 04/11/2022 15:18 DG Knee Complete 4 Views Left CLINICAL DATA:  Chronic bilateral knee pain.  Chronic pain of lower extremity, bilateral. Left knee pain/arthralgia.  EXAM: LEFT KNEE - COMPLETE 4+ VIEW  COMPARISON:  No prior left knee exams.  FINDINGS: Mild medial tibiofemoral joint space narrowing. Patellar [place]. Tibiofemoral peripheral spurring is most prominent in the medial tibiofemoral compartment. No fracture. No erosion, periostitis or focal bone abnormalities. No large knee joint effusion. Vascular calcifications. Otherwise unremarkable soft tissues.  IMPRESSION: 1. Mild osteoarthritis most prominent in the medial tibiofemoral compartment. 2. Mild patellar [place].  Electronically Signed   By: Narda Rutherford M.D.   On: 04/11/2022 15:15 DG Knee Complete 4 Views Right CLINICAL DATA:  Pain.  EXAM: RIGHT KNEE - COMPLETE 4+ VIEW  COMPARISON:  None Available.  FINDINGS: Tricompartmental degenerative changes, most prominent in the medial compartment. No fracture or dislocation. No bony lesions. No significant joint effusion. Vascular calcifications in the posterior vasculature.  IMPRESSION: Tricompartmental  degenerative changes, most prominent in the medial compartment.  Electronically Signed   By: Gerome Sam III M.D.   On: 04/11/2022 14:30 Note: Reviewed        Physical Exam  General appearance: Well nourished, well developed, and well hydrated. In no apparent acute distress Mental status: Alert, oriented x 3 (person, place, & time)       Respiratory: No evidence of acute respiratory distress Eyes: PERLA Vitals: BP (!) 100/58   Pulse 85   Temp 98.5 F (36.9 C)   Resp 18   Ht 5\' 8"  (1.727 m)   Wt 170 lb (77.1 kg)   SpO2 99%   BMI 25.85 kg/m  BMI: Estimated body mass index is 25.85 kg/m as calculated from the following:   Height as of this encounter: 5\' 8"  (1.727 m).   Weight as of this encounter: 170 lb (77.1 kg). Ideal: Ideal body weight: 68.4 kg (150 lb 12.7 oz) Adjusted ideal body weight: 71.9 kg (158 lb 7.6 oz)  Assessment   Diagnosis Status  1. Chronic knee pain (1ry area of Pain) (Bilateral) (R>L)   2. Tricompartment osteoarthritis of knee (Right)   3. Primary osteoarthritis of knees (Bilateral) (R>L)   4. Chronic lower extremity pain (Bilateral)   5. Diabetic peripheral neuropathy (HCC)   6. Chronic painful diabetic neuropathy (HCC)    Controlled Controlled Controlled   Updated Problems: Problem  Diabetic Peripheral Neuropathy (Hcc)  Chronic Painful Diabetic Neuropathy (Hcc)  Osteoarthritis of feet (Bilateral)   Left foot: Moderate hallux valgus with mild osteoarthritis of the first metatarsophalangeal joint.  Hammertoe deformity of the second toe.  Small plantar calcaneal spur.  Right foot: Slight hammertoe deformity of the fourth and fifth digits.  Moderate osteoarthritis at the first metatarsophalangeal joint.  No evidence of inflammatory arthropathy.  Small plantar calcaneal spur.   Osteoarthritis of knees (Bilateral) (R>L)   Left knee: Mild osteoarthritis most prominent in the medial tibiofemoral compartment.  Mild patellar B[place].  Right knee:  Tricompartmental degenerative changes, most prominent in the medial compartment.   Osteoarthritis of hips (Bilateral)   Left hip: Mild osteoarthritis of the left hip.  Right hip: Mild-moderate osteoarthritis of the right hip.   Osteoarthritis of shoulder (Right)  Osteoarthritis of hands (Bilateral)  Chronic knee pain (1ry area of Pain) (Bilateral) (R>L)  Chronic lower extremity pain (Bilateral) (R>L)    Plan of Care  Problem-specific:  No problem-specific Assessment & Plan notes found for this encounter.  Mr. Deitrich Decleene has a current medication list which includes the following long-term medication(s): cetirizine, fluticasone, linagliptin, lisinopril, metformin, rosuvastatin, and sitagliptin.  Pharmacotherapy (Medications Ordered): No orders of the defined types were placed in this encounter.  Orders:  Orders Placed This Encounter  Procedures   MR KNEE RIGHT WO CONTRAST    Standing Status:   Future    Standing Expiration Date:   08/25/2022    Scheduling Instructions:     Please make sure that the patient understands that this needs to be done as soon as possible. Never have the patient do the imaging "just before the next appointment". Inform patient that having the imaging done within the [facility] will expedite the availability of the results and will provide      imaging availability to the requesting physician. In addition inform the patient that the imaging order has an expiration date and will not be renewed if not done within the active period.    Order Specific Question:   What is the patient's sedation requirement?    Answer:   No Sedation    Order Specific Question:   Does the patient have a pacemaker or implanted devices?    Answer:   No    Order Specific Question:   Preferred imaging location?    Answer:   [facility] (table limit-350lbs)    Order Specific Question:   Call Results- Best Contact Number?    Answer:   [phone] [facility])     Order Specific Question:   Radiology Contrast Protocol - do NOT remove file path    Answer:   [email or web address]   NCV with EMG(electromyography)    Bilateral testing requested.    Standing Status:   Future    Standing Expiration Date:   09/25/2022    Scheduling Instructions:     Please refer this patient to [facility] for Nerve Conduction testing of the lower extremities. (EMG & PNCV)    Order Specific Question:   Where should this test be performed?    Answer:   other    Order Specific Question:   Release to patient    Answer:   Immediate   Follow-up plan:   Return for Eval-day (M,W), (F2F), for review of ordered tests.      Interventional Therapies  Risk Factors  Considerations:  Language barrier: [place] (interpreter required)  IDDM  CAD  Hx. MI  (B) Carotid A. Stenosis  HTN  AoV stenosis  Mitral & Tricuspid V. insufficiency  OSA   Planned  Pending:   Therapeutic bilateral IA Zilretta knee injection #1    Under consideration:   Diagnostic/therapeutic bilateral IA steroid knee injection #2  Therapeutic bilateral IA Zilretta knee injection #1    Completed:   Diagnostic/therapeutic bilateral IA steroid knee inj. x1 (05/15/2022) (9-0/10) (100/100/95-100)    Completed by other providers:   None at this time   Therapeutic  Palliative (PRN) options:   None established      Recent Visits Date Type Provider Dept  05/31/22 Office Visit Delano Metz, MD [facility]  05/15/22 Procedure visit Delano Metz, MD [facility]  05/07/22 Office Visit Delano Metz, MD [facility]  Showing recent visits within past 90 days and meeting all other requirements Today's Visits Date Type Provider Dept  07/26/22 Office Visit Delano Metz, MD [facility]  Showing today's visits and meeting all other requirements Future Appointments No visits were found meeting these  conditions. Showing future appointments within next 90 days and meeting all other requirements  I discussed the assessment and treatment plan with the patient. The  patient was provided an opportunity to ask questions and all were answered. The patient agreed with the plan and demonstrated an understanding of the instructions.  Patient advised to call back or seek an in-person evaluation if the symptoms or condition worsens.  Duration of encounter: 40 minutes.  Total time on encounter, as per AMA guidelines included both the face-to-face and non-face-to-face time personally spent by the physician and/or other qualified health care professional(s) on the day of the encounter (includes time in activities that require the physician or other qualified health care professional and does not include time in activities normally performed by clinical staff). Physician's time may include the following activities when performed: Preparing to see the patient (e.g., pre-charting review of records, searching for previously ordered imaging, lab work, and nerve conduction tests) Review of prior analgesic pharmacotherapies. Reviewing PMP Interpreting ordered tests (e.g., lab work, imaging, nerve conduction tests) Performing post-procedure evaluations, including interpretation of diagnostic procedures Obtaining and/or reviewing separately obtained history Performing a medically appropriate examination and/or evaluation Counseling and educating the patient/family/caregiver Ordering medications, tests, or procedures Referring and communicating with other health care professionals (when not separately reported) Documenting clinical information in the electronic or other health record Independently interpreting results (not separately reported) and communicating results to the patient/ family/caregiver Care coordination (not separately reported)  Note by: Oswaldo Done, MD Date: 07/26/2022; Time: 4:50 PM

## 2022-08-03 ENCOUNTER — Ambulatory Visit
Admission: RE | Admit: 2022-08-03 | Discharge: 2022-08-03 | Disposition: A | Discharge status: Home / Self Care | Payer: Medicare Other | Source: Ambulatory Visit | Attending: Pain Medicine | Admitting: Pain Medicine

## 2022-08-03 DIAGNOSIS — M25562 Pain in left knee: Secondary | ICD-10-CM | POA: Diagnosis present

## 2022-08-03 DIAGNOSIS — M79605 Pain in left leg: Secondary | ICD-10-CM | POA: Insufficient documentation

## 2022-08-03 DIAGNOSIS — M170 Bilateral primary osteoarthritis of knee: Secondary | ICD-10-CM | POA: Insufficient documentation

## 2022-08-03 DIAGNOSIS — M79604 Pain in right leg: Secondary | ICD-10-CM | POA: Insufficient documentation

## 2022-08-03 DIAGNOSIS — G8929 Other chronic pain: Secondary | ICD-10-CM | POA: Diagnosis present

## 2022-08-03 DIAGNOSIS — M1711 Unilateral primary osteoarthritis, right knee: Secondary | ICD-10-CM | POA: Insufficient documentation

## 2022-08-03 DIAGNOSIS — M25561 Pain in right knee: Secondary | ICD-10-CM | POA: Diagnosis present

## 2022-08-13 ENCOUNTER — Other Ambulatory Visit: Payer: Self-pay | Admitting: Pain Medicine

## 2022-08-13 DIAGNOSIS — E559 Vitamin D deficiency, unspecified: Secondary | ICD-10-CM

## 2022-10-02 ENCOUNTER — Telehealth: Payer: Self-pay | Admitting: Pain Medicine

## 2022-10-02 NOTE — Telephone Encounter (Signed)
PT son called to see had patient MRI results were in. PT son states that his dad has been in a lot of pain. Please give son a call, or I can schedule appt to bring patient back to review the results.

## 2022-10-08 NOTE — Progress Notes (Unsigned)
PROVIDER NOTE: Information contained herein reflects review and annotations entered in association with encounter. Interpretation of such information and data should be left to medically-trained personnel. Information provided to patient can be located elsewhere in the medical record under "Patient Instructions". Document created using STT-dictation technology, any transcriptional errors that may result from process are unintentional.    Patient: Adrian Kim  Service Category: E/M  Provider: Oswaldo Done, MD  DOB: Oct 26, 1940  DOS: 10/10/2022  Referring Provider: Center, Darcella Gasman*  MRN: [id number]  Specialty: Interventional Pain Management  PCP: Adrian Kim  Type: Established Patient  Setting: Ambulatory outpatient    Location: Office  Delivery: Face-to-face     HPI  [name], a 82 y.o. year old male, is here today because of his No primary diagnosis found.. [name] primary complain today is No chief complaint on file.  Pertinent problems: 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; Chronic lower extremity pain (Bilateral) (R>L); Trigger ring finger of left hand; Chronic feet pain (Bilateral); Rib pain (Bilateral); Generalized osteoarthritis of multiple sites; Osteoarthritis of feet (Bilateral); Osteoarthritis of knees (Bilateral) (R>L); Osteoarthritis of hips (Bilateral); Vertigo; Difficulty in walking; DDD (degenerative disc disease), cervical; Osteoarthritis of facet joint of cervical spine; Cervical facet hypertrophy; Grade 1 Retrolisthesis of C3/C4; Osteoarthritis of shoulder (Right); Osteoarthritis of glenohumeral joint (Right); DDD (degenerative disc disease), lumbosacral; Lumbar facet arthropathy (L3-4, L5-S1); Lumbar facet hypertrophy; Osteoarthritis of facet joint  of lumbar spine; Tricompartment osteoarthritis of knee (Right); Osteoarthritis of hands (Bilateral); Chronic low back pain (Bilateral) w/ sciatica (Bilateral); Diabetic peripheral neuropathy (HCC); and Chronic painful diabetic neuropathy (HCC) on their pertinent problem list. Pain Assessment: Severity of   is reported as a  /10. Location:    / . Onset:  . Quality:  . Timing:  . Modifying factor(s):  Marland Kitchen Vitals:  vitals were not taken for this visit.  BMI: Estimated body mass index is 25.85 kg/m as calculated from the following:   Height as of 07/26/22: 5\' 8"  (1.727 m).   Weight as of 07/26/22: 170 lb (77.1 kg). Last encounter: 07/26/2022. Last procedure: 05/15/2022.  Reason for encounter:  *** . ***  Pharmacotherapy Assessment  Analgesic: None MME/day: 0 mg/day   Monitoring:  PMP: PDMP reviewed during this encounter.       Pharmacotherapy: No side-effects or adverse reactions reported. Compliance: No problems identified. Effectiveness: Clinically acceptable.  No notes on file  No results found for: "CBDTHCR" No results found for: "D8THCCBX" No results found for: "D9THCCBX"  UDS:  Summary  Date Value Ref Range Status  04/09/2022 Note  Final    Comment:    ==================================================================== Compliance Drug Analysis, Ur ==================================================================== Test                             Result       Flag       Units  Drug Present not Declared for Prescription Verification   Alcohol, Ethyl                 0.048        UNEXPECTED g/dL    Sources of ethyl alcohol include alcoholic beverages or as a    fermentation product of glucose; glucose is present in this specimen.    Interpret result  with caution, as the presence of ethyl alcohol is    likely due, at least in part, to fermentation of glucose.  Drug Absent but Declared for Prescription Verification   Acetaminophen                  Not Detected UNEXPECTED     Acetaminophen, as indicated in the declared medication list, is not    always detected even when used as directed.    Salicylate                     Not Detected UNEXPECTED    Aspirin, as indicated in the declared medication list, is not always    detected even when used as directed.    Ibuprofen                      Not Detected UNEXPECTED    Ibuprofen, as indicated in the declared medication list, is not    always detected even when used as directed.  ==================================================================== Test                      Result    Flag   Units      Ref Range   Creatinine              39               mg/dL      >=34 ==================================================================== Declared Medications:  The flagging and interpretation on this report are based on the  following declared medications.  Unexpected results may arise from  inaccuracies in the declared medications.   **Note: The testing scope of this panel does not include small to  moderate amounts of these reported medications:   Acetaminophen (Tylenol)  Aspirin  Ibuprofen   **Note: The testing scope of this panel does not include the  following reported medications:   Betamethasone  Canagliflozin (Invokana)  Cetirizine (Zyrtec)  Dorzolamide  Fluticasone  Latanoprost (Xalatan)  Linagliptin (Tradjenta)  Lisinopril (Zestril)  Metformin  Rosuvastatin (Crestor)  Semaglutide (Rybelsus)  Sitagliptin (Januvia)  Tamsulosin (Flomax)  Timolol (Timoptic) ==================================================================== For clinical consultation, please call [phone]. ====================================================================       ROS  Constitutional: Denies any fever or chills Gastrointestinal: No reported hemesis, hematochezia, vomiting, or acute GI distress Musculoskeletal: Denies any acute onset joint swelling, redness, loss of ROM, or weakness Neurological:  No reported episodes of acute onset apraxia, aphasia, dysarthria, agnosia, amnesia, paralysis, loss of coordination, or loss of consciousness  Medication Review  Semaglutide, TRUEplus Lancets 28G, acetaminophen, aspirin EC, betamethasone valerate ointment, canagliflozin, cetirizine, dorzolamide-timolol, fluticasone, glucose blood, ibuprofen, latanoprost, linagliptin, lisinopril, metFORMIN, rosuvastatin, sitaGLIPtin, tamsulosin, and timolol  History Review  Allergy: [name] is allergic to zolpidem. Drug: [name]  reports no history of drug use. Alcohol:  reports no history of alcohol use. Tobacco:  reports that he has quit smoking. He has never used smokeless tobacco. Social: [name]  reports that he has quit smoking. He has never used smokeless tobacco. He reports that he does not drink alcohol and does not use drugs. Medical:  has a past medical history of Arthritis, Diabetes (HCC), Glaucoma, Heart attack (HCC), HLD (hyperlipidemia), HTN (hypertension), Lower back pain, Sleep apnea, and Tinnitus. Surgical: [name]  has a past surgical history that includes Coronary angioplasty with stent; Colonoscopy with propofol (N/A, 12/10/2014); and Cataract extraction w/PHACO (Right, 11/16/2015). Family: family history includes Leukemia in his son.  Laboratory Chemistry Profile   Renal Lab Results  Component Value Date   BUN 14 04/09/2022   CREATININE 0.80 04/09/2022   GFRNONAA >60 04/09/2022    Hepatic Lab Results  Component Value Date   AST 16 04/09/2022   ALT 12 04/09/2022   ALBUMIN 4.4 04/09/2022   ALKPHOS 57 04/09/2022    Electrolytes Lab Results  Component Value Date   NA 136 04/09/2022   K 4.4 04/09/2022   CL 103 04/09/2022   CALCIUM 9.2 04/09/2022   MG 2.1 04/09/2022    Bone Lab Results  Component Value Date   25OHVITD1 6.8 (L) 04/09/2022   25OHVITD2 <1.0 04/09/2022   25OHVITD3 6.8 04/09/2022    Inflammation (CRP: Acute Phase) (ESR: Chronic Phase) Lab  Results  Component Value Date   CRP 0.5 04/09/2022   ESRSEDRATE 4 04/09/2022         Note: Above Lab results reviewed.  Recent Imaging Review  MR KNEE RIGHT WO CONTRAST CLINICAL DATA:  Right knee pain.  EXAM: MRI OF THE RIGHT KNEE WITHOUT CONTRAST  TECHNIQUE: Multiplanar, multisequence MR imaging of the knee was performed. No intravenous contrast was administered.  COMPARISON:  Radiographs 04/09/2022  FINDINGS: Examination is limited due to patient motion.  MENISCI  Medial meniscus: Severely degenerated and extensively torn. Inferior articular surface flap type tear with flipped meniscal fragment in the medial gutter. There is also an adjacent large synovial "mass" measuring approximately 2 cm wrapping around the posterior aspect of the tibia. Focal synovitis or possible nodular synovitis.  Lateral meniscus: Radial tear involving the posterior horn and oblique coursing superior articular surface tear involving the anterior horn midbody junction region.  LIGAMENTS  Cruciates:  Intact.  Advanced mucoid degeneration of the ACL.  Collaterals:  Intact  CARTILAGE  Patellofemoral:  Advanced degenerative chondrosis.  Medial: Severe degenerative chondrosis with full-thickness cartilage loss, joint space narrowing and spurring.  Lateral:  Moderate to advanced degenerative chondrosis.  Joint: Small to moderate-sized joint effusion and areas of synovial inflammation.  Popliteal Fossa: No popliteal mass. Small to moderate-sized Baker's cyst.  Extensor Mechanism: The patella retinacular structures are intact and the quadriceps and patellar tendons are intact.  Bones:  No acute bony findings.  Other: Unremarkable knee musculature.  IMPRESSION: 1. Severely degenerated and extensively torn medial meniscus. 2. Radial tear involving the posterior horn of the lateral meniscus and oblique coursing superior articular surface tear involving the anterior horn midbody  junction region. 3. Intact ligamentous structures and no acute bony findings. 4. Tricompartmental degenerative changes most severe in the medial compartment. 5. Small to moderate-sized joint effusion and areas of synovial inflammation. Small to moderate-sized Baker's cyst. 2 cm synovial "mass" as detailed above, likely nodular synovitis.  Electronically Signed   By: Rudie Meyer M.D.   On: 08/12/2022 07:25 Note: Reviewed        Physical Exam  General appearance: Well nourished, well developed, and well hydrated. In no apparent acute distress Mental status: Alert, oriented x 3 (person, place, & time)       Respiratory: No evidence of acute respiratory distress Eyes: PERLA Vitals: There were no vitals taken for this visit. BMI: Estimated body mass index is 25.85 kg/m as calculated from the following:   Height as of 07/26/22: 5\' 8"  (1.727 m).   Weight as of 07/26/22: 170 lb (77.1 kg). Ideal: Patient weight not recorded  Assessment   Diagnosis Status  No diagnosis found. Controlled Controlled Controlled   Updated Problems: No problems updated.  Plan of Care  Problem-specific:  No problem-specific Assessment & Plan notes found for this encounter.  [name] has a current medication list which includes the following long-term medication(s): cetirizine, fluticasone, linagliptin, lisinopril, metformin, rosuvastatin, and sitagliptin.  Pharmacotherapy (Medications Ordered): No orders of the defined types were placed in this encounter.  Orders:  No orders of the defined types were placed in this encounter.  Follow-up plan:   No follow-ups on file.      Interventional Therapies  Risk Factors  Considerations:  Language barrier: [place] (interpreter required)  IDDM  CAD  Hx. MI  (B) Carotid A. Stenosis  HTN  AoV stenosis  Mitral & Tricuspid V. insufficiency  OSA   Planned  Pending:   Therapeutic bilateral IA Zilretta knee injection #1    Under consideration:    Diagnostic/therapeutic bilateral IA steroid knee injection #2  Therapeutic bilateral IA Zilretta knee injection #1    Completed:   Diagnostic/therapeutic bilateral IA steroid knee inj. x1 (05/15/2022) (9-0/10) (100/100/95-100)    Completed by other providers:   None at this time   Therapeutic  Palliative (PRN) options:   None established       Recent Visits Date Type Provider Dept  07/26/22 Office Visit Delano Metz, MD [facility]  Showing recent visits within past 90 days and meeting all other requirements Future Appointments Date Type Provider Dept  10/10/22 Appointment Delano Metz, MD [facility]  Showing future appointments within next 90 days and meeting all other requirements  I discussed the assessment and treatment plan with the patient. The patient was provided an opportunity to ask questions and all were answered. The patient agreed with the plan and demonstrated an understanding of the instructions.  Patient advised to call back or seek an in-person evaluation if the symptoms or condition worsens.  Duration of encounter: *** minutes.  Total time on encounter, as per AMA guidelines included both the face-to-face and non-face-to-face time personally spent by the physician and/or other qualified Kim care professional(s) on the day of the encounter (includes time in activities that require the physician or other qualified Kim care professional and does not include time in activities normally performed by clinical staff). Physician's time may include the following activities when performed: Preparing to see the patient (e.g., pre-charting review of records, searching for previously ordered imaging, lab work, and nerve conduction tests) Review of prior analgesic pharmacotherapies. Reviewing PMP Interpreting ordered tests (e.g., lab work, imaging, nerve conduction tests) Performing post-procedure evaluations, including interpretation  of diagnostic procedures Obtaining and/or reviewing separately obtained history Performing a medically appropriate examination and/or evaluation Counseling and educating the patient/family/caregiver Ordering medications, tests, or procedures Referring and communicating with other Kim care professionals (when not separately reported) Documenting clinical information in the electronic or other Kim record Independently interpreting results (not separately reported) and communicating results to the patient/ family/caregiver Care coordination (not separately reported)  Note by: Adrian Done, MD Date: 10/10/2022; Time: 6:28 AM

## 2022-10-10 ENCOUNTER — Encounter: Payer: Self-pay | Admitting: Pain Medicine

## 2022-10-10 ENCOUNTER — Ambulatory Visit: Payer: Medicare Other | Attending: Pain Medicine | Admitting: Pain Medicine

## 2022-10-10 VITALS — BP 95/53 | HR 91 | Temp 98.2°F | Ht 66.0 in | Wt 175.0 lb

## 2022-10-10 DIAGNOSIS — S83231S Complex tear of medial meniscus, current injury, right knee, sequela: Secondary | ICD-10-CM

## 2022-10-10 DIAGNOSIS — R52 Pain, unspecified: Secondary | ICD-10-CM | POA: Diagnosis present

## 2022-10-10 DIAGNOSIS — M170 Bilateral primary osteoarthritis of knee: Secondary | ICD-10-CM

## 2022-10-10 DIAGNOSIS — M25562 Pain in left knee: Secondary | ICD-10-CM | POA: Diagnosis present

## 2022-10-10 DIAGNOSIS — M79671 Pain in right foot: Secondary | ICD-10-CM | POA: Diagnosis present

## 2022-10-10 DIAGNOSIS — M25461 Effusion, right knee: Secondary | ICD-10-CM | POA: Diagnosis present

## 2022-10-10 DIAGNOSIS — M25561 Pain in right knee: Secondary | ICD-10-CM | POA: Insufficient documentation

## 2022-10-10 DIAGNOSIS — E1142 Type 2 diabetes mellitus with diabetic polyneuropathy: Secondary | ICD-10-CM | POA: Diagnosis present

## 2022-10-10 DIAGNOSIS — R936 Abnormal findings on diagnostic imaging of limbs: Secondary | ICD-10-CM | POA: Diagnosis present

## 2022-10-10 DIAGNOSIS — Z7984 Long term (current) use of oral hypoglycemic drugs: Secondary | ICD-10-CM

## 2022-10-10 DIAGNOSIS — M1711 Unilateral primary osteoarthritis, right knee: Secondary | ICD-10-CM | POA: Diagnosis present

## 2022-10-10 DIAGNOSIS — M67361 Transient synovitis, right knee: Secondary | ICD-10-CM | POA: Diagnosis present

## 2022-10-10 DIAGNOSIS — M7121 Synovial cyst of popliteal space [Baker], right knee: Secondary | ICD-10-CM

## 2022-10-10 DIAGNOSIS — G8929 Other chronic pain: Secondary | ICD-10-CM

## 2022-10-10 DIAGNOSIS — E1140 Type 2 diabetes mellitus with diabetic neuropathy, unspecified: Secondary | ICD-10-CM

## 2022-10-10 DIAGNOSIS — M79672 Pain in left foot: Secondary | ICD-10-CM | POA: Diagnosis present

## 2022-10-10 DIAGNOSIS — R262 Difficulty in walking, not elsewhere classified: Secondary | ICD-10-CM

## 2022-10-10 DIAGNOSIS — S83271S Complex tear of lateral meniscus, current injury, right knee, sequela: Secondary | ICD-10-CM | POA: Diagnosis present

## 2022-10-10 NOTE — Progress Notes (Signed)
Safety precautions to be maintained throughout the outpatient stay will include: orient to surroundings, keep bed in low position, maintain call bell within reach at all times, provide assistance with transfer out of bed and ambulation.  

## 2022-10-10 NOTE — Patient Instructions (Addendum)
______________________________________________________________________    Procedure instructions  Stop blood-thinners  Do not eat or drink fluids (other than water) for 6 hours before your procedure  No water for 2 hours before your procedure  Take your blood pressure medicine with a sip of water  Arrive 30 minutes before your appointment  If sedation is planned, bring suitable driver. Pennie Banter, Benedetto Goad, & public transportation are NOT APPROVED)  Carefully read the "Preparing for your procedure" detailed instructions  If you have questions call us at [phone]  ______________________________________________________________________      ______________________________________________________________________    Preparing for your procedure  Appointments: If you think you may not be able to keep your appointment, call 24-48 hours in advance to cancel. We need time to make it available to others.  During your procedure appointment there will be: No Prescription Refills. No disability issues to discussed. No medication changes or discussions.  Instructions: Food intake: Avoid eating anything solid for at least 8 hours prior to your procedure. Clear liquid intake: You may take clear liquids such as water up to 2 hours prior to your procedure. (No carbonated drinks. No soda.) Transportation: Unless otherwise stated by your physician, bring a driver. (Driver cannot be a Market researcher, Pharmacist, community, or any other form of public transportation.) Morning Medicines: Except for blood thinners, take all of your other morning medications with a sip of water. Make sure to take your heart and blood pressure medicines. If your blood pressure's lower number is above 100, the case will be rescheduled. Blood thinners: Make sure to stop your blood thinners as instructed.  If you take a blood thinner, but were not instructed to stop it, call our office [phone] and ask to talk to a nurse. Not stopping a blood  thinner prior to certain procedures could lead to serious complications. Diabetics on insulin: Notify the staff so that you can be scheduled 1st case in the morning. If your diabetes requires high dose insulin, take only  of your normal insulin dose the morning of the procedure and notify the staff that you have done so. Preventing infections: Shower with an antibacterial soap the morning of your procedure.  Build-up your immune system: Take 1000 mg of Vitamin C with every meal (3 times a day) the day prior to your procedure. Antibiotics: Inform the nursing staff if you are taking any antibiotics or if you have any conditions that may require antibiotics prior to procedures. (Example: recent joint implants)   Pregnancy: If you are pregnant make sure to notify the nursing staff. Not doing so may result in injury to the fetus, including death.  Sickness: If you have a cold, fever, or any active infections, call and cancel or reschedule your procedure. Receiving steroids while having an infection may result in complications. Arrival: You must be in the facility at least 30 minutes prior to your scheduled procedure. Tardiness: Your scheduled time is also the cutoff time. If you do not arrive at least 15 minutes prior to your procedure, you will be rescheduled.  Children: Do not bring any children with you. Make arrangements to keep them home. Dress appropriately: There is always a possibility that your clothing may get soiled. Avoid long dresses. Valuables: Do not bring any jewelry or valuables.  Reasons to call and reschedule or cancel your procedure: (Following these recommendations will minimize the risk of a serious complication.) Surgeries: Avoid having procedures within 2 weeks of any surgery. (Avoid for 2 weeks before or after any surgery). Flu Shots:  Avoid having procedures within 2 weeks of a flu shots or . (Avoid for 2 weeks before or after immunizations). Barium: Avoid having a procedure  within 7-10 days after having had a radiological study involving the use of radiological contrast. (Myelograms, Barium swallow or enema study). Heart attacks: Avoid any elective procedures or surgeries for the initial 6 months after a "Myocardial Infarction" (Heart Attack). Blood thinners: It is imperative that you stop these medications before procedures. Let us know if you if you take any blood thinner.  Infection: Avoid procedures during or within two weeks of an infection (including chest colds or gastrointestinal problems). Symptoms associated with infections include: Localized redness, fever, chills, night sweats or profuse sweating, burning sensation when voiding, cough, congestion, stuffiness, runny nose, sore throat, diarrhea, nausea, vomiting, cold or Flu symptoms, recent or current infections. It is specially important if the infection is over the area that we intend to treat. Heart and lung problems: Symptoms that may suggest an active cardiopulmonary problem include: cough, chest pain, breathing difficulties or shortness of breath, dizziness, ankle swelling, uncontrolled high or unusually low blood pressure, and/or palpitations. If you are experiencing any of these symptoms, cancel your procedure and contact your primary care physician for an evaluation.  Remember:  Regular Business hours are:  Monday to Thursday 8:00 AM to 4:00 PM  Provider's Schedule: Delano Metz, MD:  Procedure days: Tuesday and Thursday 7:30 AM to 4:00 PM  Edward Jolly, MD:  Procedure days: Monday and Wednesday 7:30 AM to 4:00 PM Last  Updated: 10/09/2022 ______________________________________________________________________      ______________________________________________________________________    General Risks and Possible Complications  Patient Responsibilities: It is important that you read this as it is part of your informed consent. It is our duty to inform you of the risks and possible  complications associated with treatments offered to you. It is your responsibility as a patient to read this and to ask questions about anything that is not clear or that you believe was not covered in this document.  Patient's Rights: You have the right to refuse treatment. You also have the right to change your mind, even after initially having agreed to have the treatment done. However, under this last option, if you wait until the last second to change your mind, you may be charged for the materials used up to that point.  Introduction: Medicine is not an Visual merchandiser. Everything in Medicine, including the lack of treatment(s), carries the potential for danger, harm, or loss (which is by definition: Risk). In Medicine, a complication is a secondary problem, condition, or disease that can aggravate an already existing one. All treatments carry the risk of possible complications. The fact that a side effects or complications occurs, does not imply that the treatment was conducted incorrectly. It must be clearly understood that these can happen even when everything is done following the highest safety standards.  No treatment: You can choose not to proceed with the proposed treatment alternative. The "PRO(s)" would include: avoiding the risk of complications associated with the therapy. The "CON(s)" would include: not getting any of the treatment benefits. These benefits fall under one of three categories: diagnostic; therapeutic; and/or palliative. Diagnostic benefits include: getting information which can ultimately lead to improvement of the disease or symptom(s). Therapeutic benefits are those associated with the successful treatment of the disease. Finally, palliative benefits are those related to the decrease of the primary symptoms, without necessarily curing the condition (example: decreasing the pain from a flare-up  of a chronic condition, such as incurable terminal cancer).  General Risks and  Complications: These are associated to most interventional treatments. They can occur alone, or in combination. They fall under one of the following six (6) categories: no benefit or worsening of symptoms; bleeding; infection; nerve damage; allergic reactions; and/or death. No benefits or worsening of symptoms: In Medicine there are no guarantees, only probabilities. No healthcare provider can ever guarantee that a medical treatment will work, they can only state the probability that it may. Furthermore, there is always the possibility that the condition may worsen, either directly, or indirectly, as a consequence of the treatment. Bleeding: This is more common if the patient is taking a blood thinner, either prescription or over the counter (example: Goody Powders, Fish oil, Aspirin, Garlic, etc.), or if suffering a condition associated with impaired coagulation (example: Hemophilia, cirrhosis of the liver, low platelet counts, etc.). However, even if you do not have one on these, it can still happen. If you have any of these conditions, or take one of these drugs, make sure to notify your treating physician. Infection: This is more common in patients with a compromised immune system, either due to disease (example: diabetes, cancer, human immunodeficiency virus [HIV], etc.), or due to medications or treatments (example: therapies used to treat cancer and rheumatological diseases). However, even if you do not have one on these, it can still happen. If you have any of these conditions, or take one of these drugs, make sure to notify your treating physician. Nerve Damage: This is more common when the treatment is an invasive one, but it can also happen with the use of medications, such as those used in the treatment of cancer. The damage can occur to small secondary nerves, or to large primary ones, such as those in the spinal cord and brain. This damage may be temporary or permanent and it may lead to  impairments that can range from temporary numbness to permanent paralysis and/or brain death. Allergic Reactions: Any time a substance or material comes in contact with our body, there is the possibility of an allergic reaction. These can range from a mild skin rash (contact dermatitis) to a severe systemic reaction (anaphylactic reaction), which can result in death. Death: In general, any medical intervention can result in death, most of the time due to an unforeseen complication. ______________________________________________________________________    Capsaicin Patches What is this medication? CAPSAICIN (cap SAY sin) relieves minor pain in your muscles and joints. It works by making your skin feel warm or cool, which blocks pain signals going to the brain. This medicine may be used for other purposes; ask your health care provider or pharmacist if you have questions. COMMON BRAND NAME(S): Qutenza What should I tell my care team before I take this medication? They need to know if you have any of these conditions: Broken or irritated skin High blood pressure History of heart attack or stroke An unusual or allergic reaction to capsaicin, hot peppers, other medications, foods, dyes, or preservatives Pregnant or trying to get pregnant Breast-feeding How should I use this medication? This medication is for external use only. It is applied by your care team in a hospital or clinic setting. Talk to your care team about the use of this medication in children. Special care may be needed. Overdosage: If you think you have taken too much of this medicine contact a poison control center or emergency room at once. NOTE: This medicine is only for you. Do  not share this medicine with others. What if I miss a dose? This does not apply. What may interact with this medication? Interactions are not expected. Do not use any other skin products on the affected area without asking your care team. This list  may not describe all possible interactions. Give your health care provider a list of all the medicines, herbs, non-prescription drugs, or dietary supplements you use. Also tell them if you smoke, drink alcohol, or use illegal drugs. Some items may interact with your medicine. What should I watch for while using this medication? Your condition will be monitored carefully while you are receiving this medication. Your blood pressure may go up during the procedure. Do not touch the medication patch during treatment. This medication causes red, burning skin. You may need pain medication for during and after the procedure. This medication can make you more sensitive to heat for a few days after treatment. Be careful in hot showers or baths. Keep out of the sun. Exercise may make the treated skin feel hotter. Tell your care team if your symptoms do not start to get better or if they get worse. What side effects may I notice from receiving this medication? Side effects that you should report to your care team as soon as possible: Allergic reactions--skin rash, itching, hives, swelling of the face, lips, tongue, or throat Side effects that usually do not require medical attention (report these to your care team if they continue or are bothersome): Mild skin irritation, redness, or dryness This list may not describe all possible side effects. Call your doctor for medical advice about side effects. You may report side effects to FDA at 1-800-FDA-1088. Where should I keep my medication? This medication is given in a hospital or clinic. It will not be stored at home. NOTE: This sheet is a summary. It may not cover all possible information. If you have questions about this medicine, talk to your doctor, pharmacist, or health care provider.  2024 [employer] Standard (2020-12-06 00:00:00)

## 2022-11-05 NOTE — Progress Notes (Unsigned)
PROVIDER NOTE: Interpretation of information contained herein should be left to medically-trained personnel. Specific patient instructions are provided elsewhere under "Patient Instructions" section of medical record. This document was created in part using STT-dictation technology, any transcriptional errors that may result from this process are unintentional.  Patient: Adrian Kim Type: Established DOB: [birth year] MRN: [id number] PCP: [facility]  Service: Procedure DOS: 11/06/2022 Setting: Ambulatory Location: Ambulatory outpatient facility Delivery: Face-to-face Provider: Oswaldo Done, MD Specialty: Interventional Pain Management Specialty designation: 09 Location: Outpatient facility Ref. Prov.: [facility]       Interventional Therapy   Interventional Treatment:           Type: Qutenza Neurolysis #1  Laterality:  Bilateral Area treated: Feet Imaging Guidance: None Anesthesia/analgesia/anxiolysis/sedation: None required Medication (Right): Qutenza (capsaicin 8%) topical system Medication (Left): Qutenza (capsaicin 8%) topical system Date: 11/06/2022 Performed by: Oswaldo Done, MD Rationale (medical necessity): procedure needed and proper for the treatment of Adrian Kim's medical symptoms and needs. Indication: Painful diabetic peripheral neuralgia (DPN) (ICD-10-CM:E11.40) severe enough to impact quality of life or function. No diagnosis found. NAS-11 Pain score:   Pre-procedure:  /10   Post-procedure:  /10     Position / Prep / Materials:  Position: Supine  Materials: Qutenza Kit  H&P (Pre-op Assessment):  Adrian Kim is a 82 y.o. (year old), male patient, seen today for interventional treatment. He  has a past surgical history that includes Coronary angioplasty with stent; Colonoscopy with propofol (N/A, 12/10/2014); and Cataract extraction w/PHACO (Right, 11/16/2015). Adrian Kim has a current medication list which  includes the following prescription(s): acetaminophen, aspirin ec, betamethasone valerate ointment, cetirizine, dorzolamide-timolol, fluticasone, glucose blood, ibuprofen, invokana, latanoprost, linagliptin, lisinopril, metformin, rosuvastatin, rybelsus, sitagliptin, tamsulosin, timolol, and trueplus lancets 28g. His primarily concern today is the No chief complaint on file.  Initial Vital Signs:  Pulse/HCG Rate:    Temp:   Resp:   BP:   SpO2:    BMI: Estimated body mass index is 28.25 kg/m as calculated from the following:   Height as of 10/10/22: 5\' 6"  (1.676 m).   Weight as of 10/10/22: 175 lb (79.4 kg).  Risk Assessment: Allergies: Reviewed. He is allergic to zolpidem.  Allergy Precautions: None required Coagulopathies: Reviewed. None identified.  Blood-thinner therapy: None at this time Active Infection(s): Reviewed. None identified. Adrian Kim is afebrile  Site Confirmation: Adrian Kim was asked to confirm the procedure and laterality before marking the site Procedure checklist: Completed Consent: Before the procedure and under the influence of no sedative(s), amnesic(s), or anxiolytics, the patient was informed of the treatment options, risks and possible complications. To fulfill our ethical and legal obligations, as recommended by the American Medical Association's Code of Ethics, I have informed the patient of my clinical impression; the nature and purpose of the treatment or procedure; the risks, benefits, and possible complications of the intervention; the alternatives, including doing nothing; the risk(s) and benefit(s) of the alternative treatment(s) or procedure(s); and the risk(s) and benefit(s) of doing nothing. The patient was provided information about the general risks and possible complications associated with the procedure. These may include, but are not limited to: failure to achieve desired goals, infection, bleeding, organ or nerve damage, allergic reactions,  paralysis, and death. In addition, the patient was informed of those risks and complications associated to the procedure, such as failure to decrease pain; infection; bleeding; organ or nerve damage with subsequent damage to sensory, motor, and/or autonomic systems, resulting in permanent  pain, numbness, and/or weakness of one or several areas of the body; allergic reactions; (i.e.: anaphylactic reaction); and/or death. Furthermore, the patient was informed of those risks and complications associated with the medications. These include, but are not limited to: allergic reactions (i.e.: anaphylactic or anaphylactoid reaction(s)); adrenal axis suppression; blood sugar elevation that in diabetics may result in ketoacidosis or comma; water retention that in patients with history of congestive heart failure may result in shortness of breath, pulmonary edema, and decompensation with resultant heart failure; weight gain; swelling or edema; medication-induced neural toxicity; particulate matter embolism and blood vessel occlusion with resultant organ, and/or nervous system infarction; and/or aseptic necrosis of one or more joints. Finally, the patient was informed that Medicine is not an exact science; therefore, there is also the possibility of unforeseen or unpredictable risks and/or possible complications that may result in a catastrophic outcome. The patient indicated having understood very clearly. We have given the patient no guarantees and we have made no promises. Enough time was given to the patient to ask questions, all of which were answered to the patient's satisfaction. Adrian Kim has indicated that he wanted to continue with the procedure. Attestation: I, the ordering provider, attest that I have discussed with the patient the benefits, risks, side-effects, alternatives, likelihood of achieving goals, and potential problems during recovery for the procedure that I have provided informed consent. Date   Time: {CHL [facility] TIME CHOICES:[id number]}  Pre-Procedure Preparation:  Monitoring: As per clinic protocol. Respiration, ETCO2, SpO2, BP, heart rate and rhythm monitor placed and checked for adequate function Safety Precautions: Patient was assessed for positional comfort and pressure points before starting the procedure. Time-out: I initiated and conducted the "Time-out" before starting the procedure, as per protocol. The patient was asked to participate by confirming the accuracy of the "Time Out" information. Verification of the correct person, site, and procedure were performed and confirmed by me, the nursing staff, and the patient. "Time-out" conducted as per Joint Commission's Universal Protocol (UP.01.01.01). Time:   Start Time:   hrs.  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  Description of the Procedure: Protocol guidelines were followed. The patient was assisted into a comfortable position.  Informed consent was obtained in the patient monitored in the usual manner.  All questions were answered prior to the procedure.  They Qutenza patches were applied to the affected area and then covered with the wrap.  The Patient was kept under observation until the treatment was completed.  The patches were removed and the treated area was inspected.  There were no vitals filed for this visit.   End Time:   hrs.  Type of Imaging Technique: None used Indication(s): N/A Exposure Time: No patient exposure Contrast: None used. Fluoroscopic Guidance: N/A Ultrasound Guidance: N/A Interpretation: N/A  Post-operative Assessment:  Post-procedure Vital Signs:  Pulse/HCG Rate:    Temp:   Resp:   BP:   SpO2:     EBL: None  Complications: No immediate post-treatment complications observed by team, or reported by patient.  Note: The patient tolerated the entire procedure well. A repeat set of vitals were taken after the procedure and the patient was kept under observation following institutional policy, for this type of procedure. Post-procedural neurological assessment  was performed, showing return to baseline, prior to discharge. The patient was provided with post-procedure discharge instructions, including a section on how to identify potential problems. Should any problems arise concerning this procedure, the patient was given instructions to immediately contact us, at any time, without hesitation. In any case, we plan to contact the patient by telephone for a follow-up status report regarding this interventional procedure.  Comments:  No additional relevant information.  Plan of Care (POC)  Orders:  No orders of the defined types were placed in this encounter.  Chronic Opioid Analgesic:  None MME/day: 0 mg/day   Medications ordered for procedure: No orders of the defined types were placed in this encounter.  Medications administered: Adrian Kim had no medications administered during this visit.  See the medical record for exact dosing, route, and time of administration.  Follow-up plan:   No follow-ups on file.       Interventional Therapies  Risk Factors  Considerations:  Language barrier: [place] (interpreter required)  IDDM  CAD  Hx. MI  (B) Carotid A. Stenosis  HTN  AoV stenosis  Mitral & Tricuspid V. insufficiency  OSA   Planned  Pending:   Referral to orthopedic surgery Therapeutic bilateral lower extremity Qutenza Tx #1  Therapeutic bilateral IA steroid knee inj. #2  Therapeutic bilateral IA Zilretta knee injection #1    Under consideration:   Diagnostic/therapeutic bilateral IA steroid knee injection #2  Therapeutic bilateral lower extremity Qutenza Tx #1   Therapeutic bilateral IA Zilretta knee injection #1    Completed:   Diagnostic/therapeutic bilateral IA steroid knee inj. x1 (05/15/2022) (9-0/10) (100/100/95-100)    Completed by other providers:   None at this time   Therapeutic  Palliative (PRN) options:   None established      Recent Visits Date Type Provider Dept  10/10/22 Office Visit Delano Metz, MD [facility]  Showing recent visits within past 90 days and meeting all other requirements Future Appointments Date Type Provider Dept  11/06/22 Appointment Delano Metz, MD [facility]  Showing future appointments within next 90 days and meeting all other requirements  Disposition: Discharge home  Discharge (Date  Time): 11/06/2022;   hrs.   Primary Care Physician: [facility] Location: [facility] Note by: Oswaldo Done, MD (TTS technology used. I apologize for any typographical errors that were not detected and corrected.) Date: 11/06/2022; Time: 6:45 AM  Disclaimer:  Medicine is not an Visual merchandiser. The only guarantee in medicine is that nothing is guaranteed. It is important to note that the decision to proceed with this intervention was based on the information collected from the patient. The Data and conclusions were drawn from the patient's questionnaire, the interview, and the physical examination. Because the information was provided in large part by the patient, it cannot be guaranteed that it has not been purposely or unconsciously manipulated. Every effort has been made to obtain as much relevant data as possible for this evaluation. It is important to note that the conclusions that lead to this procedure are derived in large part from the available data. Always take into account that the treatment will also be dependent on availability of resources and existing treatment guidelines, considered by other Pain Management  Practitioners as being common knowledge and practice, at the time of the intervention. For Medico-Legal purposes, it is also important to point out that variation in procedural techniques and pharmacological choices are the acceptable norm. The indications,  contraindications, technique, and results of the above procedure should only be interpreted and judged by a Board-Certified Interventional Pain Specialist with extensive familiarity and expertise in the same exact procedure and technique.

## 2022-11-05 NOTE — Progress Notes (Addendum)
PROVIDER NOTE: Interpretation of information contained herein should be left to medically-trained personnel. Specific patient instructions are provided elsewhere under "Patient Instructions" section of medical record. This document was created in part using STT-dictation technology, any transcriptional errors that may result from this process are unintentional.  Patient: Adrian Kim Type: Established DOB: [birth year] MRN: [id number] PCP: [facility]  Service: Procedure DOS: 11/06/2022 Setting: Ambulatory Location: Ambulatory outpatient facility Delivery: Face-to-face Provider: Oswaldo Done, MD Specialty: Interventional Pain Management Specialty designation: 09 Location: Outpatient facility Ref. Prov.: [facility]       Interventional Therapy   Type:  Zilretta (ER-triamcinolone/steroid) Intra-articular Knee Injection #1  Laterality: Bilateral (-50) Level/approach: Lateral Imaging guidance: None required ([id number]) Anesthesia: Local anesthesia (1-2% Lidocaine) Anxiolysis: None                 Sedation: No Sedation                       DOS: 11/06/2022  Performed by: Oswaldo Done, MD  Purpose: Diagnostic/Therapeutic Indications: Knee arthralgia associated to osteoarthritis of the knee 1. Chronic knee pain (1ry area of Pain) (Bilateral) (R>L)   2. 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 Tricompartment osteoarthritis of knee (Right)    NAS-11 score:   Pre-procedure: 10-Worst pain ever/10   Post-procedure: 0-No pain/10     Pre-Procedure Preparation  Monitoring: As per clinic protocol.  Risk Assessment: Vitals:  IHK:[id number] body mass index is 26.61 kg/m as calculated from the following:   Height as of this encounter: 5\' 8"  (1.727 m).   Weight as of this encounter: 175 lb (79.4 kg)., Rate:85 , BP:(!) 100/51, Resp:(!) 22, Temp:98.2 F (36.8 C), SpO2:98 %  Allergies: He is allergic to zolpidem.  Precautions: No  additional precautions required  Blood-thinner(s): None at this time  Coagulopathies: Reviewed. None identified.   Active Infection(s): Reviewed. None identified. Adrian Kim is afebrile   Location setting: Exam room Position: Sitting w/ knee bent 90 degrees Safety Precautions: Patient was assessed for positional comfort and pressure points before starting the procedure. Prepping solution: DuraPrep (Iodine Povacrylex [0.7% available iodine] and Isopropyl Alcohol, 74% w/w) Prep Area: Entire knee region Approach: percutaneous, just above the tibial plateau, lateral to the infrapatellar tendon. Intended target: Intra-articular knee space Materials: Tray: Block Needle(s): Regular Qty: 1/side Length: 1.5-inch Gauge: 25G (x1) + 22G (x1)  Meds ordered this encounter  Medications   Triamcinolone Acetonide (ZILRETTA) intra-articular injection 32 mg    Maintain refrigerated.  Prepared suspension may be stored up to 4 hours at ambient conditions.   Triamcinolone Acetonide (ZILRETTA) intra-articular injection 32 mg    Maintain refrigerated.  Prepared suspension may be stored up to 4 hours at ambient conditions.   pentafluoroprop-tetrafluoroeth (GEBAUERS) aerosol   lidocaine HCl (PF) (XYLOCAINE) 2 % injection 5 mL   ropivacaine (PF) 2 mg/mL (0.2%) (NAROPIN) injection 3 mL   capsaicin topical system 8 % patch 4 patch    Disregard order if transmitted to pharmacy. Qutenza Kit to be used from [facility].    Orders Placed This Encounter  Procedures   NEUROLYSIS    Please order Qutenza patches from pharmacy    Standing Status:   Future    Standing Expiration Date:   02/05/2023    Order Specific Question:   Where will this procedure be performed?    Answer:   [facility]   KNEE INJECTION    Local  Anesthetic & Steroid injection.    Scheduling Instructions:     Side(s): Bilateral Knee     Sedation: None     Timeframe: Today    Order Specific Question:   Where will  this procedure be performed?    Answer:   [facility]   Ambulatory referral to Podiatry    Referral Priority:   Routine    Referral Type:   Consultation    Referral Reason:   Specialty Services Required    Requested Specialty:   Podiatry    Number of Visits Requested:   1   Informed Consent Details: Physician/Practitioner Attestation; Transcribe to consent form and obtain patient signature    Nursing Order: Transcribe to consent form and obtain patient signature.    Order Specific Question:   Physician/Practitioner attestation of informed consent for procedure/surgical case    Answer:   I, the physician/practitioner, attest that I have discussed with the patient the benefits, risks, side effects, alternatives, likelihood of achieving goals and potential problems during recovery for the procedure that I have provided informed consent.    Order Specific Question:   Procedure    Answer:   Qutenza peripheral neurolysis    Order Specific Question:   Physician/Practitioner performing the procedure    Answer:   Delano Metz, MD    Order Specific Question:   Indication/Reason    Answer:   Painful Diabetic Peripheral Neuropathy   Informed Consent Details: Physician/Practitioner Attestation; Transcribe to consent form and obtain patient signature    Note: Always confirm laterality of pain with Adrian Kim, before procedure. Transcribe to consent form and obtain patient signature.    Order Specific Question:   Physician/Practitioner attestation of informed consent for procedure/surgical case    Answer:   I, the physician/practitioner, attest that I have discussed with the patient the benefits, risks, side effects, alternatives, likelihood of achieving goals and potential problems during recovery for the procedure that I have provided informed consent.    Order Specific Question:   Procedure    Answer:   Bilateral intra-articular knee arthrocentesis (aspiration and/or injection)    Order  Specific Question:   Physician/Practitioner performing the procedure    Answer:   Anaiz Qazi A. Laban Emperor, MD    Order Specific Question:   Indication/Reason    Answer:   Chronic bilateral knee pain secondary to knee arthropathy/arthralgia     Time-out: 1538 I initiated and conducted the "Time-out" before starting the procedure, as per protocol. The patient was asked to participate by confirming the accuracy of the "Time Out" information. Verification of the correct person, site, and procedure were performed and confirmed by me, the nursing staff, and the patient. "Time-out" conducted as per Joint Commission's Universal Protocol (UP.01.01.01). Procedure checklist: Completed  H&P (Pre-op  Assessment)  [name] is a 82 y.o. (year old), male patient, seen today for interventional treatment. He  has a past surgical history that includes Coronary angioplasty with stent; Colonoscopy with propofol (N/A, 12/10/2014); and Cataract extraction w/PHACO (Right, 11/16/2015). Mr. Gargus has a current medication list which includes the following prescription(s): acetaminophen, aspirin ec, betamethasone valerate ointment, cetirizine, dorzolamide-timolol, fluticasone, glucose blood, ibuprofen, invokana, latanoprost, linagliptin, lisinopril, metformin, rosuvastatin, rybelsus, sitagliptin, tamsulosin, timolol, and trueplus lancets 28g. His primarily concern today is the Knee Pain (right)  He is allergic to zolpidem.   Last encounter: My last encounter with him was on 10/10/2022. Pertinent problems: Mr. 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; Cervical facet hypertrophy; Grade 1 Retrolisthesis of C3/C4; Osteoarthritis of shoulder (Right); Osteoarthritis of glenohumeral joint (Right); DDD (degenerative disc disease), lumbosacral; Lumbar facet arthropathy (L3-4, L5-S1); Lumbar facet hypertrophy; Osteoarthritis of facet joint of lumbar spine; Tricompartment osteoarthritis of knee (Right); Osteoarthritis of hands (Bilateral); 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 Pain Assessment: Severity of Chronic pain is reported as a 10-Worst pain ever/10. Location: Knee Right, Left (knee pain and foot pain bilateral)/radiates down to feet bilateral. Onset: More than a month ago. Quality: Aching, Burning, Constant, Heaviness, Sharp. Timing:  . Modifying factor(s): rest. Vitals:  height is 5\' 8"  (1.727 m) and weight is 175 lb (79.4 kg). His temporal temperature is 98.2 F (36.8 C). His blood pressure is 100/51 (abnormal) and his pulse is 85. His respiration is 22 (abnormal) and oxygen saturation is 98%.   Reason for encounter: "interventional pain management therapy due pain of at least four (4) weeks in duration, with failure to respond and/or inability to tolerate more conservative care.  Site Confirmation: Mr. Strycker was asked to confirm the procedure and laterality before marking the  site.  Consent: Before the procedure and under the influence of no sedative(s), amnesic(s), or anxiolytics, the patient was informed of the treatment options, risks and possible complications. To fulfill our ethical and legal obligations, as recommended by the American Medical Association's Code of Ethics, I have informed the patient of my clinical impression; the nature and purpose of the treatment or procedure; the risks, benefits, and possible complications of the intervention; the alternatives, including doing nothing; the risk(s) and benefit(s) of the alternative treatment(s) or procedure(s); and the risk(s) and benefit(s) of doing nothing. The patient was provided information about the general risks and possible complications associated with the procedure. These may include, but are not limited to: failure to achieve desired goals, infection, bleeding, organ or nerve damage, allergic reactions, paralysis, and death. In addition, the patient was informed of those risks and complications associated to Spine-related procedures, such as failure to decrease pain; infection (i.e.: Meningitis, epidural or intraspinal abscess); bleeding (i.e.: epidural hematoma, subarachnoid hemorrhage, or any other type of intraspinal or peri-dural bleeding); organ or nerve damage (i.e.: Any type of peripheral nerve, nerve root, or spinal cord injury) with subsequent damage to sensory, motor, and/or autonomic systems, resulting in permanent pain, numbness, and/or weakness of one or several areas of the body; allergic reactions; (i.e.: anaphylactic reaction); and/or death. Furthermore, the patient was informed of those risks and complications associated with the medications. These include, but are not limited to: allergic reactions (i.e.: anaphylactic or anaphylactoid reaction(s)); adrenal axis suppression; blood sugar elevation that in diabetics may result in ketoacidosis or comma; water retention that in patients with history  of congestive heart failure may result in shortness of breath, pulmonary edema, and decompensation with resultant heart failure; weight gain; swelling or edema; medication-induced neural toxicity; particulate matter embolism and blood vessel occlusion with resultant organ, and/or nervous system infarction; and/or aseptic necrosis of one or more joints. Finally, the patient was informed that Medicine is not an exact science; therefore, there is also the possibility of unforeseen or unpredictable risks and/or possible complications that may result in a catastrophic outcome. The patient indicated having understood very clearly. We have given the patient no guarantees and we have made no promises. Enough time was given to the patient to ask questions, all of which were answered to the patient's satisfaction. Mr. Lapietra has indicated that he wanted to continue with the procedure. Attestation: I, the ordering provider, attest that I have discussed with the patient the benefits, risks, side-effects, alternatives, likelihood of achieving goals, and potential problems during recovery for the procedure that I have provided informed consent.  Date  Time: 11/06/2022  1:45 PM  Description of procedure  Start Time: 1538 hrs  Local Anesthesia: Once the patient was positioned, prepped, and time-out was completed. The target area was identified located. The skin was marked with an approved surgical skin marker. Once marked, the skin (epidermis, dermis, and hypodermis), and deeper tissues (fat, connective tissue and muscle) were infiltrated with a small amount of a short-acting local anesthetic, loaded on a 10cc syringe with a 25G, 1.5-in  Needle. An appropriate amount of time was allowed for local anesthetics to take effect before proceeding to the next step. Local Anesthetic: Lidocaine 1-2% The unused portion of the local anesthetic was discarded in the proper designated containers. Safety Precautions: Aspiration  looking for blood return was conducted prior to all injections. At no point did I inject any substances, as a needle was being advanced. Before injecting, the patient was told to immediately notify me if he was experiencing any new onset of "ringing in the ears, or metallic taste in the mouth". No attempts were made at seeking any paresthesias. Safe injection practices and needle disposal techniques used. Medications properly checked for expiration dates. SDV (single dose vial) medications used. After the completion of the procedure, all disposable equipment used was discarded in the proper designated medical waste containers.  Technical description: Protocol guidelines were followed. After positioning, the target area was identified and prepped in the usual manner. Skin & deeper tissues infiltrated with local anesthetic. Appropriate amount of time allowed to pass for local anesthetics to take effect. Proper needle placement secured. Once satisfactory needle placement was confirmed, I proceeded to inject the desired solution in slow, incremental fashion, intermittently assessing for discomfort or any signs of abnormal or undesired spread of substance. Once completed, the needle was removed and disposed of, as per hospital protocols. The area was cleaned, making sure to leave some of the prepping solution back to take advantage of its long term bactericidal properties.  Aspiration:  Negative        Vitals:   11/06/22 1341  BP: (!) 100/51  Pulse: 85  Resp: (!) 22  Temp: 98.2 F (36.8 C)  TempSrc: Temporal  SpO2: 98%  Weight: 175 lb (79.4 kg)  Height: 5\' 8"  (1.727 m)    End Time: 1541 hrs  Imaging guidance  Imaging-assisted Technique: None required. Indication(s): N/A Exposure Time: N/A Contrast: None Fluoroscopic Guidance: N/A Ultrasound Guidance: N/A Interpretation: N/A  Post-op assessment  Post-procedure Vital Signs:  Pulse/HCG Rate: 85  Temp: 98.2 F (  36.8 C) Resp: (!)  22 BP: (!) 100/51 SpO2: 98 %  EBL: None  Complications: No immediate post-treatment complications observed by team, or reported by patient.  Note: The patient tolerated the entire procedure well. A repeat set of vitals were taken after the procedure and the patient was kept under observation following institutional policy, for this type of procedure. Post-procedural neurological assessment was performed, showing return to baseline, prior to discharge. The patient was provided with post-procedure discharge instructions, including a section on how to identify potential problems. Should any problems arise concerning this procedure, the patient was given instructions to immediately contact us, at any time, without hesitation. In any case, we plan to contact the patient by telephone for a follow-up status report regarding this interventional procedure.  Comments:  No additional relevant information.  Plan of care  Chronic Opioid Analgesic:  None MME/day: 0 mg/day   Medications administered: We administered Triamcinolone Acetonide, Triamcinolone Acetonide, pentafluoroprop-tetrafluoroeth, lidocaine HCl (PF), ropivacaine (PF) 2 mg/mL (0.2%), and capsaicin topical system.  Follow-up plan:   Return in about 2 weeks (around 11/20/2022) for Eval-day (M,W), (Face2F), (PPE).      Interventional Therapies  Risk Factors  Considerations:  Language barrier: [place] (interpreter required)  IDDM  CAD  Hx. MI  (B) Carotid A. Stenosis  HTN  AoV stenosis  Mitral & Tricuspid V. insufficiency  OSA   Planned  Pending:   Referral to orthopedic surgery Therapeutic bilateral lower extremity Qutenza Tx #1  Therapeutic bilateral IA steroid knee inj. #2  Therapeutic bilateral IA Zilretta knee injection #1    Under consideration:   Diagnostic/therapeutic bilateral IA steroid knee injection #2  Therapeutic bilateral lower extremity Qutenza Tx #1  Therapeutic bilateral IA Zilretta knee injection #1     Completed:   Diagnostic/therapeutic bilateral IA steroid knee inj. x1 (05/15/2022) (9-0/10) (100/100/95-100)    Completed by other providers:   None at this time   Therapeutic  Palliative (PRN) options:   None established      Recent Visits Date Type Provider Dept  11/06/22 Procedure visit Delano Metz, MD [facility]  10/10/22 Office Visit Delano Metz, MD [facility]  Showing recent visits within past 90 days and meeting all other requirements Today's Visits Date Type Provider Dept  11/22/22 Office Visit Delano Metz, MD [facility]  Showing today's visits and meeting all other requirements Future Appointments No visits were found meeting these conditions. Showing future appointments within next 90 days and meeting all other requirements   Disposition: Discharge home  Discharge (Date  Time): 11/06/2022; 1542 hrs.   Primary Care Physician: [facility] Location: [facility] Note by: Oswaldo Done, MD Date: 11/06/2022; Time: 3:22 PM  DISCLAIMER: Medicine is not an Visual merchandiser. It has no guarantees or warranties. The decision to proceed with this intervention was based on the information collected from the patient. Conclusions were drawn from the patient's questionnaire, interview, and examination. Because information was provided in large part by the patient, it cannot be guaranteed that it has not been purposely or unconsciously manipulated or altered. Every effort has been made to obtain as much accurate, relevant, available data as possible. Always take into account that the treatment will also be dependent on availability of resources and existing treatment guidelines, considered by other Pain Management Specialists as being common knowledge and practice, at the time of the intervention. It is also important to point out that variation in procedural techniques and  pharmacological choices are the acceptable norm. For Medico-Legal review purposes, the indications, contraindications, technique, and results of the these procedures should only be evaluated, judged and interpreted by a Board-Certified Interventional Pain Specialist with extensive familiarity and expertise in the same exact procedure and technique.

## 2022-11-06 ENCOUNTER — Ambulatory Visit: Payer: Medicare Other | Attending: Pain Medicine | Admitting: Pain Medicine

## 2022-11-06 ENCOUNTER — Encounter: Payer: Self-pay | Admitting: Pain Medicine

## 2022-11-06 VITALS — BP 100/51 | HR 85 | Temp 98.2°F | Resp 22 | Ht 68.0 in | Wt 175.0 lb

## 2022-11-06 DIAGNOSIS — M79672 Pain in left foot: Secondary | ICD-10-CM | POA: Diagnosis present

## 2022-11-06 DIAGNOSIS — E1142 Type 2 diabetes mellitus with diabetic polyneuropathy: Secondary | ICD-10-CM | POA: Insufficient documentation

## 2022-11-06 DIAGNOSIS — M170 Bilateral primary osteoarthritis of knee: Secondary | ICD-10-CM | POA: Insufficient documentation

## 2022-11-06 DIAGNOSIS — M25561 Pain in right knee: Secondary | ICD-10-CM | POA: Diagnosis present

## 2022-11-06 DIAGNOSIS — E1140 Type 2 diabetes mellitus with diabetic neuropathy, unspecified: Secondary | ICD-10-CM | POA: Diagnosis present

## 2022-11-06 DIAGNOSIS — M1711 Unilateral primary osteoarthritis, right knee: Secondary | ICD-10-CM | POA: Insufficient documentation

## 2022-11-06 DIAGNOSIS — R52 Pain, unspecified: Secondary | ICD-10-CM | POA: Diagnosis present

## 2022-11-06 DIAGNOSIS — M25562 Pain in left knee: Secondary | ICD-10-CM | POA: Insufficient documentation

## 2022-11-06 DIAGNOSIS — M79671 Pain in right foot: Secondary | ICD-10-CM | POA: Diagnosis present

## 2022-11-06 DIAGNOSIS — G8929 Other chronic pain: Secondary | ICD-10-CM | POA: Diagnosis present

## 2022-11-06 MED ORDER — TRIAMCINOLONE ACETONIDE 32 MG IX SRER
32.0000 mg | Freq: Once | INTRA_ARTICULAR | Status: AC
  Administered 2022-11-06: 32 mg via INTRA_ARTICULAR
  Filled 2022-11-06: qty 5

## 2022-11-06 MED ORDER — LIDOCAINE HCL (PF) 2 % IJ SOLN
5.0000 mL | Freq: Once | INTRAMUSCULAR | Status: AC
  Administered 2022-11-06: 5 mL
  Filled 2022-11-06: qty 5

## 2022-11-06 MED ORDER — ROPIVACAINE HCL 2 MG/ML IJ SOLN
3.0000 mL | Freq: Once | INTRAMUSCULAR | Status: AC
  Administered 2022-11-06: 3 mL via INTRA_ARTICULAR
  Filled 2022-11-06: qty 20

## 2022-11-06 MED ORDER — CAPSAICIN-CLEANSING GEL 8 % EX KIT
4.0000 | PACK | Freq: Once | CUTANEOUS | Status: AC
  Administered 2022-11-06: 4 via TOPICAL
  Filled 2022-11-06: qty 4

## 2022-11-06 MED ORDER — PENTAFLUOROPROP-TETRAFLUOROETH EX AERO
INHALATION_SPRAY | Freq: Once | CUTANEOUS | Status: AC
  Administered 2022-11-06: 30 via TOPICAL
  Filled 2022-11-06: qty 116

## 2022-11-06 NOTE — Progress Notes (Signed)
Safety precautions to be maintained throughout the outpatient stay will include: orient to surroundings, keep bed in low position, maintain call bell within reach at all times, provide assistance with transfer out of bed and ambulation.   Qutenza started [id number], stopped at 1510. Feet cleansed, Instructed on importance of checking feet (Diabetic) and clean socks and socks. Patient with hole in shoe today. Son with understanding, referral order placed

## 2022-11-06 NOTE — Patient Instructions (Addendum)
Pain Management Discharge Instructions  General Discharge Instructions :  If you need to reach your doctor call: Monday-Friday 8:00 am - 4:00 pm at [phone] or toll free [phone].  After clinic hours [phone] to have operator reach doctor.  Bring all of your medication bottles to all your appointments in the pain clinic.  To cancel or reschedule your appointment with Pain Management please remember to call 24 hours in advance to avoid a fee.  Refer to the educational materials which you have been given on: General Risks, I had my Procedure. Discharge Instructions, Post Sedation.  Post Procedure Instructions:  The drugs you were given will stay in your system until tomorrow, so for the next 24 hours you should not drive, make any legal decisions or drink any alcoholic beverages.  You may eat anything you prefer, but it is better to start with liquids then soups and crackers, and gradually work up to solid foods.  Please notify your doctor immediately if you have any unusual bleeding, trouble breathing or pain that is not related to your normal pain.  Depending on the type of procedure that was done, some parts of your body may feel week and/or numb.  This usually clears up by tonight or the next day.  Walk with the use of an assistive device or accompanied by an adult for the 24 hours.  You may use ice on the affected area for the first 24 hours.  Put ice in a Ziploc bag and cover with a towel and place against area 15 minutes on 15 minutes off.  You may switch to heat after 24 hours.Capsaicin Patches What is this medication? CAPSAICIN (cap SAY sin) relieves minor pain in your muscles and joints. It works by making your skin feel warm or cool, which blocks pain signals going to the brain. This medicine may be used for other purposes; ask your health care provider or pharmacist if you have questions. COMMON BRAND NAME(S): Qutenza What should I tell my care team before I take  this medication? They need to know if you have any of these conditions: Broken or irritated skin High blood pressure History of heart attack or stroke An unusual or allergic reaction to capsaicin, hot peppers, other medications, foods, dyes, or preservatives Pregnant or trying to get pregnant Breast-feeding How should I use this medication? This medication is for external use only. It is applied by your care team in a hospital or clinic setting. Talk to your care team about the use of this medication in children. Special care may be needed. Overdosage: If you think you have taken too much of this medicine contact a poison control center or emergency room at once. NOTE: This medicine is only for you. Do not share this medicine with others. What if I miss a dose? This does not apply. What may interact with this medication? Interactions are not expected. Do not use any other skin products on the affected area without asking your care team. This list may not describe all possible interactions. Give your health care provider a list of all the medicines, herbs, non-prescription drugs, or dietary supplements you use. Also tell them if you smoke, drink alcohol, or use illegal drugs. Some items may interact with your medicine. What should I watch for while using this medication? Your condition will be monitored carefully while you are receiving this medication. Your blood pressure may go up during the procedure. Do not touch the medication patch during treatment. This medication causes red,  burning skin. You may need pain medication for during and after the procedure. This medication can make you more sensitive to heat for a few days after treatment. Be careful in hot showers or baths. Keep out of the sun. Exercise may make the treated skin feel hotter. Tell your care team if your symptoms do not start to get better or if they get worse. What side effects may I notice from receiving this  medication? Side effects that you should report to your care team as soon as possible: Allergic reactions--skin rash, itching, hives, swelling of the face, lips, tongue, or throat Side effects that usually do not require medical attention (report these to your care team if they continue or are bothersome): Mild skin irritation, redness, or dryness This list may not describe all possible side effects. Call your doctor for medical advice about side effects. You may report side effects to FDA at 1-800-FDA-1088. Where should I keep my medication? This medication is given in a hospital or clinic. It will not be stored at home. NOTE: This sheet is a summary. It may not cover all possible information. If you have questions about this medicine, talk to your doctor, pharmacist, or health care provider.  2024 [employer] (2020-12-06 00:00:00)

## 2022-11-07 ENCOUNTER — Telehealth: Payer: Self-pay | Admitting: *Deleted

## 2022-11-07 NOTE — Telephone Encounter (Signed)
Attempted to call for post procedure follow-up. Message left.

## 2022-11-18 NOTE — Progress Notes (Signed)
PROVIDER NOTE: Information contained herein reflects review and annotations entered in association with encounter. Interpretation of such information and data should be left to medically-trained personnel. Information provided to patient can be located elsewhere in the medical record under "Patient Instructions". Document created using STT-dictation technology, any transcriptional errors that may result from process are unintentional.    Patient: Adrian Kim  Service Category: E/M  Provider: Oswaldo Done, MD  DOB: 1940/09/21  DOS: 11/22/2022  Referring Provider: [facility], [facility]*  MRN: [id number]  Specialty: Interventional Pain Management  PCP: [facility]  Type: Established Patient  Setting: Ambulatory outpatient    Location: Office  Delivery: Face-to-face     HPI  [name], a 82 y.o. year old male, is here today because of his Bilateral chronic knee pain [M25.561, M25.562, G89.29]. [name] primary complain today is Knee Pain (both)  Pertinent problems: 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; Chronic lower extremity pain (Bilateral) (R>L); Trigger ring finger of left hand; Chronic feet pain (Bilateral); Rib pain (Bilateral); Generalized osteoarthritis of multiple sites; Osteoarthritis of feet (Bilateral); Osteoarthritis of knees (Bilateral) (R>L); Osteoarthritis of hips (Bilateral); Vertigo; Difficulty in walking; DDD (degenerative disc disease), cervical; Osteoarthritis of facet joint of cervical spine; Cervical facet hypertrophy; Grade 1 Retrolisthesis of C3/C4; Osteoarthritis of shoulder (Right); Osteoarthritis of glenohumeral joint (Right); DDD (degenerative disc disease), lumbosacral; Lumbar facet arthropathy (L3-4, L5-S1); Lumbar facet hypertrophy;  Osteoarthritis of facet joint of lumbar spine; Tricompartment osteoarthritis of knee (Right); Osteoarthritis of hands (Bilateral); Chronic low back pain (Bilateral) w/ sciatica (Bilateral); Diabetic peripheral neuropathy (HCC); Chronic painful diabetic neuropathy (HCC); Abnormal MRI, knee (08/12/2022) (Right); Osteoarthritis of knee (Right); Complex tear of medial meniscus of knee, sequela (Right); Complex tear of lateral meniscus of knee, sequela (Right); Baker's cyst of knee (Right); Effusion of knee joint (Right); Chronic knee pain (Right); Transient synovitis of knee (Right); Burning pain; Pes anserine bursitis (Left); Medial knee pain (Left); Chronic knee pain (Left); and Osteoarthritis of knee (Left) on their pertinent problem list. Pain Assessment: Severity of Chronic pain is reported as a 2 /10. Location: Knee Left, Right/Denies. Onset: More than a month ago. Quality: Aching, Burning, Constant, Stabbing, Sharp. Timing: Constant. Modifying factor(s): Denies. Vitals:  height is 5\' 8"  (1.727 m) and weight is 175 lb (79.4 kg). His temperature is 98.1 F (36.7 C). His blood pressure is 90/57 (abnormal) and his pulse is 80. His oxygen saturation is 98%.  BMI: Estimated body mass index is 26.61 kg/m as calculated from the following:   Height as of this encounter: 5\' 8"  (1.727 m).   Weight as of this encounter: 175 lb (79.4 kg). Last encounter: 10/10/2022. Last procedure: 11/06/2022.  Reason for encounter: post-procedure evaluation and assessment. The patient indicates having attained significant improvement of the burning sensation in his feet.  This pain is not completely gone, but it is significantly better.  In terms of the knee pain he refers having attained 100% relief of the pain for the duration of the local anesthetic which then went down to an ongoing 75% improvement.  He continues to have pain in the left knee around the Pes anserine bursa.  Today I have recommended that he begin taking vitamin  B12 and ALA (alpha lipoid acid) supplements to see if we can also improve on his neuritis.  We  will schedule him to return for a left knee Pes anserine bursa injection  Post-procedure evaluation   Procedure #1: Qutenza Neurolysis #1  Laterality:  Bilateral Area treated: Feet Imaging Guidance: None Anesthesia/analgesia/anxiolysis/sedation: None required Medication (Right): Qutenza (capsaicin 8%) topical system Medication (Left): Qutenza (capsaicin 8%) topical system Date: 11/06/2022 Performed by: Oswaldo Done, MD Rationale (medical necessity): procedure needed and proper for the treatment of [name]'s medical symptoms and needs. Indication: Painful diabetic peripheral neuralgia (DPN) (ICD-10-CM:E11.40) severe enough to impact quality of life or function. 1. Diabetic peripheral neuropathy (HCC)   2. Burning pain   3. Chronic feet pain (Bilateral)   4. Chronic painful diabetic neuropathy (HCC)    NAS-11 Pain score:   Pre-procedure: 10-Worst pain ever/10   Post-procedure: 10-Worst pain ever/10   Procedure #2 Type:  Zilretta (ER-triamcinolone/steroid) Intra-articular Knee Injection #1  Laterality: Bilateral (-50) Level/approach: Lateral Imaging guidance: None required ([id number]) Anesthesia: Local anesthesia (1-2% Lidocaine) Anxiolysis: None                 Sedation: No Sedation                       DOS: 11/06/2022  Performed by: Oswaldo Done, MD   Purpose: Diagnostic/Therapeutic Indications: Knee arthralgia associated to osteoarthritis of the knee 1. Chronic knee pain (1ry area of Pain) (Bilateral) (R>L)   2. 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 Tricompartment osteoarthritis of knee (Right)     NAS-11 score:         Pre-procedure: 10-Worst pain ever/10         Post-procedure: 0-No pain/10     Effectiveness:  Initial hour after procedure: 100 %. Subsequent 4-6 hours post-procedure: 100 %. Analgesia past initial 6 hours: 75 %. Ongoing  improvement:  Analgesic: The patient indicates having attained significant improvement of the burning sensation in his feet.  This pain is not completely gone, but it is significantly better.  In terms of the knee pain he refers having attained 100% relief of the pain for the duration of the local anesthetic which then went down to an ongoing 75% improvement.  He continues to have pain in the left knee around the Pes anserine bursa. Function: Mr. Wesler reports improvement in function ROM: Mr. Ker reports improvement in ROM  Pharmacotherapy Assessment  Analgesic: None MME/day: 0 mg/day   Monitoring: [place] PMP: PDMP reviewed during this encounter.       Pharmacotherapy: No side-effects or adverse reactions reported. Compliance: No problems identified. Effectiveness: Clinically acceptable.  Brigitte Pulse, RN  11/22/2022  3:06 PM  Sign when Signing Visit Safety precautions to be maintained throughout the outpatient stay will include: orient to surroundings, keep bed in low position, maintain call bell within reach at all times, provide assistance with transfer out of bed and ambulation.     No results found for: "CBDTHCR" No results found for: "D8THCCBX" No results found for: "D9THCCBX"  UDS:  Summary  Date Value Ref Range Status  04/09/2022 Note  Final    Comment:    ==================================================================== Compliance Drug Analysis, Ur ==================================================================== Test                             Result       Flag       Units  Drug Present not Declared for Prescription Verification   Alcohol, Ethyl  0.048        UNEXPECTED g/dL    Sources of ethyl alcohol include alcoholic beverages or as a    fermentation product of glucose; glucose is present in this specimen.    Interpret result with caution, as the presence of ethyl alcohol is    likely due, at least in part, to fermentation of glucose.  Drug  Absent but Declared for Prescription Verification   Acetaminophen                  Not Detected UNEXPECTED    Acetaminophen, as indicated in the declared medication list, is not    always detected even when used as directed.    Salicylate                     Not Detected UNEXPECTED    Aspirin, as indicated in the declared medication list, is not always    detected even when used as directed.    Ibuprofen                      Not Detected UNEXPECTED    Ibuprofen, as indicated in the declared medication list, is not    always detected even when used as directed.  ==================================================================== Test                      Result    Flag   Units      Ref Range   Creatinine              39               mg/dL      >=09 ==================================================================== Declared Medications:  The flagging and interpretation on this report are based on the  following declared medications.  Unexpected results may arise from  inaccuracies in the declared medications.   **Note: The testing scope of this panel does not include small to  moderate amounts of these reported medications:   Acetaminophen (Tylenol)  Aspirin  Ibuprofen   **Note: The testing scope of this panel does not include the  following reported medications:   Betamethasone  Canagliflozin (Invokana)  Cetirizine (Zyrtec)  Dorzolamide  Fluticasone  Latanoprost (Xalatan)  Linagliptin (Tradjenta)  Lisinopril (Zestril)  Metformin  Rosuvastatin (Crestor)  Semaglutide (Rybelsus)  Sitagliptin (Januvia)  Tamsulosin (Flomax)  Timolol (Timoptic) ==================================================================== For clinical consultation, please call [phone]. ====================================================================       ROS  Constitutional: Denies any fever or chills Gastrointestinal: No reported hemesis, hematochezia, vomiting, or acute GI  distress Musculoskeletal: Denies any acute onset joint swelling, redness, loss of ROM, or weakness Neurological: No reported episodes of acute onset apraxia, aphasia, dysarthria, agnosia, amnesia, paralysis, loss of coordination, or loss of consciousness  Medication Review  Alpha-Lipoic Acid, Semaglutide, TRUEplus Lancets 28G, Vitamin B-12, acetaminophen, aspirin EC, betamethasone valerate ointment, canagliflozin, cetirizine, dorzolamide-timolol, fluticasone, glucose blood, ibuprofen, latanoprost, linagliptin, lisinopril, metFORMIN, rosuvastatin, sitaGLIPtin, tamsulosin, and timolol  History Review  Allergy: [name] is allergic to zolpidem. Drug: [name]  reports no history of drug use. Alcohol:  reports no history of alcohol use. Tobacco:  reports that he has quit smoking. He has never used smokeless tobacco. Social: [name]  reports that he has quit smoking. He has never used smokeless tobacco. He reports that he does not drink alcohol and does not use drugs. Medical:  has a past medical history of Arthritis, Diabetes (HCC), Glaucoma, Heart attack (HCC),  HLD (hyperlipidemia), HTN (hypertension), Lower back pain, Sleep apnea, and Tinnitus. Surgical: [name]  has a past surgical history that includes Coronary angioplasty with stent; Colonoscopy with propofol (N/A, 12/10/2014); and Cataract extraction w/PHACO (Right, 11/16/2015). Family: family history includes Leukemia in his son.  Laboratory Chemistry Profile   Renal Lab Results  Component Value Date   BUN 14 04/09/2022   CREATININE 0.80 04/09/2022   GFRNONAA >60 04/09/2022    Hepatic Lab Results  Component Value Date   AST 16 04/09/2022   ALT 12 04/09/2022   ALBUMIN 4.4 04/09/2022   ALKPHOS 57 04/09/2022    Electrolytes Lab Results  Component Value Date   NA 136 04/09/2022   K 4.4 04/09/2022   CL 103 04/09/2022   CALCIUM 9.2 04/09/2022   MG 2.1 04/09/2022    Bone Lab Results  Component Value Date    25OHVITD1 6.8 (L) 04/09/2022   25OHVITD2 <1.0 04/09/2022   25OHVITD3 6.8 04/09/2022    Inflammation (CRP: Acute Phase) (ESR: Chronic Phase) Lab Results  Component Value Date   CRP 0.5 04/09/2022   ESRSEDRATE 4 04/09/2022         Note: Above Lab results reviewed.  Recent Imaging Review  MR KNEE RIGHT WO CONTRAST CLINICAL DATA:  Right knee pain.  EXAM: MRI OF THE RIGHT KNEE WITHOUT CONTRAST  TECHNIQUE: Multiplanar, multisequence MR imaging of the knee was performed. No intravenous contrast was administered.  COMPARISON:  Radiographs 04/09/2022  FINDINGS: Examination is limited due to patient motion.  MENISCI  Medial meniscus: Severely degenerated and extensively torn. Inferior articular surface flap type tear with flipped meniscal fragment in the medial gutter. There is also an adjacent large synovial "mass" measuring approximately 2 cm wrapping around the posterior aspect of the tibia. Focal synovitis or possible nodular synovitis.  Lateral meniscus: Radial tear involving the posterior horn and oblique coursing superior articular surface tear involving the anterior horn midbody junction region.  LIGAMENTS  Cruciates:  Intact.  Advanced mucoid degeneration of the ACL.  Collaterals:  Intact  CARTILAGE  Patellofemoral:  Advanced degenerative chondrosis.  Medial: Severe degenerative chondrosis with full-thickness cartilage loss, joint space narrowing and spurring.  Lateral:  Moderate to advanced degenerative chondrosis.  Joint: Small to moderate-sized joint effusion and areas of synovial inflammation.  Popliteal Fossa: No popliteal mass. Small to moderate-sized Baker's cyst.  Extensor Mechanism: The patella retinacular structures are intact and the quadriceps and patellar tendons are intact.  Bones:  No acute bony findings.  Other: Unremarkable knee musculature.  IMPRESSION: 1. Severely degenerated and extensively torn medial meniscus. 2. Radial tear  involving the posterior horn of the lateral meniscus and oblique coursing superior articular surface tear involving the anterior horn midbody junction region. 3. Intact ligamentous structures and no acute bony findings. 4. Tricompartmental degenerative changes most severe in the medial compartment. 5. Small to moderate-sized joint effusion and areas of synovial inflammation. Small to moderate-sized Baker's cyst. 2 cm synovial "mass" as detailed above, likely nodular synovitis.  Electronically Signed   By: Rudie Meyer M.D.   On: 08/12/2022 07:25 Note: Reviewed        Physical Exam  General appearance: Well nourished, well developed, and well hydrated. In no apparent acute distress Mental status: Alert, oriented x 3 (person, place, & time)       Respiratory: No evidence of acute respiratory distress Eyes: PERLA Vitals: BP (!) 90/57   Pulse 80   Temp 98.1 F (36.7 C)   Ht 5\' 8"  (1.727 m)  Wt 175 lb (79.4 kg)   SpO2 98%   BMI 26.61 kg/m  BMI: Estimated body mass index is 26.61 kg/m as calculated from the following:   Height as of this encounter: 5\' 8"  (1.727 m).   Weight as of this encounter: 175 lb (79.4 kg). Ideal: Ideal body weight: 68.4 kg (150 lb 12.7 oz) Adjusted ideal body weight: 72.8 kg (160 lb 7.6 oz)  Assessment   Diagnosis Status  1. Chronic knee pain (1ry area of Pain) (Bilateral) (R>L)   2. Chronic feet pain (Bilateral)   3. Burning pain   4. Tricompartment osteoarthritis of knee (Right)   5. Diabetic peripheral neuropathy (HCC)   6. Postop check   7. Chronic painful diabetic neuropathy (HCC)   8. Pes anserine bursitis (Left)   9. Medial knee pain (Left)   10. Chronic knee pain (Left)   11. Osteoarthritis of knee (Left)    Improving Improving Controlled   Updated Problems: No problems updated.   Plan of Care  Problem-specific:  No problem-specific Assessment & Plan notes found for this encounter.  Mr. Bardo Giustino has a current medication  list which includes the following long-term medication(s): cetirizine, vitamin b-12, fluticasone, linagliptin, lisinopril, metformin, rosuvastatin, and sitagliptin.  Pharmacotherapy (Medications Ordered): Meds ordered this encounter  Medications   Cyanocobalamin (VITAMIN B-12) 5000 MCG SUBL    Sig: Place 1 tablet (5,000 mcg total) under the tongue daily.    Dispense:  30 tablet    Refill:  0    Fill 1 day early if pharmacy is closed on scheduled refill date. Generic permitted. Do not send renewal requests.   Alpha-Lipoic Acid 600 MG CAPS    Sig: Take 1 capsule (600 mg total) by mouth daily.    Dispense:  30 capsule    Refill:  0    Do not place medication on "Automatic Refill". Fill one day early if pharmacy is closed on scheduled refill date.   Orders:  Orders Placed This Encounter  Procedures   KNEE INJECTION    Local Anesthetic & Steroid injection.    Standing Status:   Future    Standing Expiration Date:   02/22/2023    Scheduling Instructions:     Procedure: Pes anserine bursa injection     Side: Left-sided     Sedation: None     Timeframe: ASAP    Order Specific Question:   Where will this procedure be performed?    Answer:   [facility]   Nursing Instructions:    Please complete this patient's postprocedure evaluation.    Scheduling Instructions:     Please complete this patient's postprocedure evaluation.   Follow-up plan:   Return for [facility]): (L) Pes anserine Bursa injection #1.      Interventional Therapies  Risk Factors  Considerations:  Language barrier: [place] (interpreter required)  IDDM  CAD  Hx. MI  (B) Carotid A. Stenosis  HTN  AoV stenosis  Mitral & Tricuspid V. insufficiency  OSA   Planned  Pending:   Therapeutic left pes anserine bursa steroid knee inj. #1    Under consideration:      Completed:   Therapeutic bilateral IA Zilretta knee injection x1 (11/06/2022) (100/100/75/75) Therapeutic bilateral lower extremity Qutenza Tx x1  (11/06/2022) (50/50/75/75) Diagnostic/therapeutic bilateral IA steroid knee inj. x1 (05/15/2022) (9-0/10) (100/100/95-100)  (10/10/2022) Referral to orthopedic surgery   Completed by other providers:      Therapeutic  Palliative (PRN) options:   None established  Recent Visits Date Type Provider Dept  11/22/22 Office Visit Delano Metz, MD [facility]  11/06/22 Procedure visit Delano Metz, MD [facility]  10/10/22 Office Visit Delano Metz, MD [facility]  Showing recent visits within past 90 days and meeting all other requirements Future Appointments Date Type Provider Dept  12/13/22 Appointment Delano Metz, MD [facility]  Showing future appointments within next 90 days and meeting all other requirements  I discussed the assessment and treatment plan with the patient. The patient was provided an opportunity to ask questions and all were answered. The patient agreed with the plan and demonstrated an understanding of the instructions.  Patient advised to call back or seek an in-person evaluation if the symptoms or condition worsens.  Duration of encounter: 35 minutes.  Total time on encounter, as per AMA guidelines included both the face-to-face and non-face-to-face time personally spent by the physician and/or other qualified health care professional(s) on the day of the encounter (includes time in activities that require the physician or other qualified health care professional and does not include time in activities normally performed by clinical staff). Physician's time may include the following activities when performed: Preparing to see the patient (e.g., pre-charting review of records, searching for previously ordered imaging, lab work, and nerve conduction tests) Review of prior analgesic pharmacotherapies. Reviewing PMP Interpreting ordered tests (e.g., lab work, imaging, nerve conduction  tests) Performing post-procedure evaluations, including interpretation of diagnostic procedures Obtaining and/or reviewing separately obtained history Performing a medically appropriate examination and/or evaluation Counseling and educating the patient/family/caregiver Ordering medications, tests, or procedures Referring and communicating with other health care professionals (when not separately reported) Documenting clinical information in the electronic or other health record Independently interpreting results (not separately reported) and communicating results to the patient/ family/caregiver Care coordination (not separately reported)  Note by: Oswaldo Done, MD Date: 11/22/2022; Time: 5:58 AM

## 2022-11-22 ENCOUNTER — Encounter: Payer: Self-pay | Admitting: Pain Medicine

## 2022-11-22 ENCOUNTER — Ambulatory Visit: Payer: Medicare Other | Attending: Pain Medicine | Admitting: Pain Medicine

## 2022-11-22 VITALS — BP 90/57 | HR 80 | Temp 98.1°F | Ht 68.0 in | Wt 175.0 lb

## 2022-11-22 DIAGNOSIS — R52 Pain, unspecified: Secondary | ICD-10-CM

## 2022-11-22 DIAGNOSIS — M7050 Other bursitis of knee, unspecified knee: Secondary | ICD-10-CM | POA: Diagnosis present

## 2022-11-22 DIAGNOSIS — E1140 Type 2 diabetes mellitus with diabetic neuropathy, unspecified: Secondary | ICD-10-CM | POA: Diagnosis present

## 2022-11-22 DIAGNOSIS — E1142 Type 2 diabetes mellitus with diabetic polyneuropathy: Secondary | ICD-10-CM

## 2022-11-22 DIAGNOSIS — Z09 Encounter for follow-up examination after completed treatment for conditions other than malignant neoplasm: Secondary | ICD-10-CM

## 2022-11-22 DIAGNOSIS — M1711 Unilateral primary osteoarthritis, right knee: Secondary | ICD-10-CM

## 2022-11-22 DIAGNOSIS — M1712 Unilateral primary osteoarthritis, left knee: Secondary | ICD-10-CM | POA: Diagnosis present

## 2022-11-22 DIAGNOSIS — G8929 Other chronic pain: Secondary | ICD-10-CM

## 2022-11-22 DIAGNOSIS — M79671 Pain in right foot: Secondary | ICD-10-CM | POA: Insufficient documentation

## 2022-11-22 DIAGNOSIS — M25562 Pain in left knee: Secondary | ICD-10-CM | POA: Diagnosis present

## 2022-11-22 DIAGNOSIS — M79672 Pain in left foot: Secondary | ICD-10-CM | POA: Diagnosis present

## 2022-11-22 DIAGNOSIS — M25561 Pain in right knee: Secondary | ICD-10-CM | POA: Insufficient documentation

## 2022-11-22 DIAGNOSIS — M170 Bilateral primary osteoarthritis of knee: Secondary | ICD-10-CM

## 2022-11-22 DIAGNOSIS — Z7984 Long term (current) use of oral hypoglycemic drugs: Secondary | ICD-10-CM

## 2022-11-22 MED ORDER — ALPHA-LIPOIC ACID 600 MG PO CAPS
600.0000 mg | ORAL_CAPSULE | Freq: Every day | ORAL | 0 refills | Status: AC
Start: 2022-11-22 — End: 2022-12-22

## 2022-11-22 MED ORDER — VITAMIN B-12 5000 MCG SL SUBL: 5000.0000 ug | SUBLINGUAL_TABLET | Freq: Every day | SUBLINGUAL | 0 refills | Status: AC

## 2022-11-22 NOTE — Progress Notes (Signed)
Safety precautions to be maintained throughout the outpatient stay will include: orient to surroundings, keep bed in low position, maintain call bell within reach at all times, provide assistance with transfer out of bed and ambulation.  

## 2022-11-22 NOTE — Patient Instructions (Addendum)
______________________________________________________________________    OTC Supplements:   The following is a list of over-the-counter (OTC) supplements that have been found to have [employer]) studies suggesting that they may be of some benefits when used in moderation in some chronic pain-related conditions.  NOTE:  Always consult with your primary care provider and/or pharmacist before taking any OTC medications to make sure they will not interact with your current medications. Always use manufacturer's recommended dosage.  Supplement Possible benefit May be of benefit in treatment of  Active ingredient(s)  Turmeric/curcumin anti-inflammatory Joint and muscle aches and pain associated with arthritis and inflammation The main active ingredient in turmeric is curcumin.  Glucosamine/chondroitin (triple strength) may slow loss of articular cartilage Osteoarthritis glucosamine HCl and chondroitin sulfate  Vitamin D-3 may suppress release of chemicals associated with inflammation Joint and muscle aches and pain associated with arthritis and inflammation CHOLECALCIFEROL; ALPHA.-TOCOPHEROL, D  Moringa anti-inflammatory with mild analgesic effects Joint and muscle aches and pain associated with arthritis and inflammation Many:  Phenolic acids: Gallic acid, ellagic acid, ferulic acid, caffeic acid, o-coumaric acid, and chlorogenic acid. Flavonoids: Rutin, quercetin, rhamnetin, kaempferol, apigenin, and myricetin. Terpenes: Lutein, all-E-luteoxanthin, 13-Z-lutein, 15-Z-?-carotene, and all-E-zeaxanthin. Alkaloids: Marumoside A, marumoside B, and pyrrolemarumine-4?-O-?-L-rhamnopyranoside. Vitamins: Vitamin A and vitamin C. Protein: Milk protein  Melatonin Helps reset sleep cycle. Insomnia. May also be helpful in neurodegenerative disorders melatonin, also known as N-acetyl-5-methoxytryptamine  Vitamin B-12 may help keep nerves and blood cells healthy as well as maintaining function of  nervous system Neuropathies. Nerve pain (Burning pain) methylcobalamin and 5-deoxyadenosylcobalamin  Alpha-Lipoic-Acid (ALA) antioxidant that may help with nerve health, pain, and blocking the activation of some inflammatory chemicals Diabetic neuropathy and metabolic syndrome Alpha-lipoic acid (LA), or 1,2-dithiolane-3-pentanoic acid  superoxide dismutase (SOD) Currently being reviewed.  Superoxide dismutase (SOD); Copper-zinc superoxide dismutase  Tiger Balm Currently being reviewed.  Camphor; Menthol; Capsicum Extract (Capsicin); Methyl Salicylate; Essential Oils (Cassia Oil, Cajuput Oil, Clove Oil, and Dementholized Mint Oil)    ______________________________________________________________________      ______________________________________________________________________    Procedure instructions  Stop blood-thinners  Do not eat or drink fluids (other than water) for 6 hours before your procedure  No water for 2 hours before your procedure  Take your blood pressure medicine with a sip of water  Arrive 30 minutes before your appointment  If sedation is planned, bring suitable driver. Pennie Banter, Benedetto Goad, & public transportation are NOT APPROVED)  Carefully read the "Preparing for your procedure" detailed instructions  If you have questions call us at [phone]  ______________________________________________________________________      ______________________________________________________________________    Preparing for your procedure  Appointments: If you think you may not be able to keep your appointment, call 24-48 hours in advance to cancel. We need time to make it available to others.  During your procedure appointment there will be: No Prescription Refills. No disability issues to discussed. No medication changes or discussions.  Instructions: Food intake: Avoid eating anything solid for at least 8 hours prior to your procedure. Clear liquid intake: You may take  clear liquids such as water up to 2 hours prior to your procedure. (No carbonated drinks. No soda.) Transportation: Unless otherwise stated by your physician, bring a driver. (Driver cannot be a Market researcher, Pharmacist, community, or any other form of public transportation.) Morning Medicines: Except for blood thinners, take all of your other morning medications with a sip of water. Make sure to take your heart and blood pressure medicines. If your blood pressure's lower  number is above 100, the case will be rescheduled. Blood thinners: Make sure to stop your blood thinners as instructed.  If you take a blood thinner, but were not instructed to stop it, call our office [phone] and ask to talk to a nurse. Not stopping a blood thinner prior to certain procedures could lead to serious complications. Diabetics on insulin: Notify the staff so that you can be scheduled 1st case in the morning. If your diabetes requires high dose insulin, take only  of your normal insulin dose the morning of the procedure and notify the staff that you have done so. Preventing infections: Shower with an antibacterial soap the morning of your procedure.  Build-up your immune system: Take 1000 mg of Vitamin C with every meal (3 times a day) the day prior to your procedure. Antibiotics: Inform the nursing staff if you are taking any antibiotics or if you have any conditions that may require antibiotics prior to procedures. (Example: recent joint implants)   Pregnancy: If you are pregnant make sure to notify the nursing staff. Not doing so may result in injury to the fetus, including death.  Sickness: If you have a cold, fever, or any active infections, call and cancel or reschedule your procedure. Receiving steroids while having an infection may result in complications. Arrival: You must be in the facility at least 30 minutes prior to your scheduled procedure. Tardiness: Your scheduled time is also the cutoff time. If you do not arrive at least 15  minutes prior to your procedure, you will be rescheduled.  Children: Do not bring any children with you. Make arrangements to keep them home. Dress appropriately: There is always a possibility that your clothing may get soiled. Avoid long dresses. Valuables: Do not bring any jewelry or valuables.  Reasons to call and reschedule or cancel your procedure: (Following these recommendations will minimize the risk of a serious complication.) Surgeries: Avoid having procedures within 2 weeks of any surgery. (Avoid for 2 weeks before or after any surgery). Flu Shots: Avoid having procedures within 2 weeks of a flu shots or . (Avoid for 2 weeks before or after immunizations). Barium: Avoid having a procedure within 7-10 days after having had a radiological study involving the use of radiological contrast. (Myelograms, Barium swallow or enema study). Heart attacks: Avoid any elective procedures or surgeries for the initial 6 months after a "Myocardial Infarction" (Heart Attack). Blood thinners: It is imperative that you stop these medications before procedures. Let us know if you if you take any blood thinner.  Infection: Avoid procedures during or within two weeks of an infection (including chest colds or gastrointestinal problems). Symptoms associated with infections include: Localized redness, fever, chills, night sweats or profuse sweating, burning sensation when voiding, cough, congestion, stuffiness, runny nose, sore throat, diarrhea, nausea, vomiting, cold or Flu symptoms, recent or current infections. It is specially important if the infection is over the area that we intend to treat. Heart and lung problems: Symptoms that may suggest an active cardiopulmonary problem include: cough, chest pain, breathing difficulties or shortness of breath, dizziness, ankle swelling, uncontrolled high or unusually low blood pressure, and/or palpitations. If you are experiencing any of these symptoms, cancel your procedure  and contact your primary care physician for an evaluation.  Remember:  Regular Business hours are:  Monday to Thursday 8:00 AM to 4:00 PM  Provider's Schedule: Delano Metz, MD:  Procedure days: Tuesday and Thursday 7:30 AM to 4:00 PM  Edward Jolly, MD:  Procedure days: Monday and Wednesday 7:30 AM to 4:00 PM Last  Updated: 10/09/2022 ______________________________________________________________________      ______________________________________________________________________    General Risks and Possible Complications  Patient Responsibilities: It is important that you read this as it is part of your informed consent. It is our duty to inform you of the risks and possible complications associated with treatments offered to you. It is your responsibility as a patient to read this and to ask questions about anything that is not clear or that you believe was not covered in this document.  Patient's Rights: You have the right to refuse treatment. You also have the right to change your mind, even after initially having agreed to have the treatment done. However, under this last option, if you wait until the last second to change your mind, you may be charged for the materials used up to that point.  Introduction: Medicine is not an Visual merchandiser. Everything in Medicine, including the lack of treatment(s), carries the potential for danger, harm, or loss (which is by definition: Risk). In Medicine, a complication is a secondary problem, condition, or disease that can aggravate an already existing one. All treatments carry the risk of possible complications. The fact that a side effects or complications occurs, does not imply that the treatment was conducted incorrectly. It must be clearly understood that these can happen even when everything is done following the highest safety standards.  No treatment: You can choose not to proceed with the proposed treatment alternative. The "PRO(s)"  would include: avoiding the risk of complications associated with the therapy. The "CON(s)" would include: not getting any of the treatment benefits. These benefits fall under one of three categories: diagnostic; therapeutic; and/or palliative. Diagnostic benefits include: getting information which can ultimately lead to improvement of the disease or symptom(s). Therapeutic benefits are those associated with the successful treatment of the disease. Finally, palliative benefits are those related to the decrease of the primary symptoms, without necessarily curing the condition (example: decreasing the pain from a flare-up of a chronic condition, such as incurable terminal cancer).  General Risks and Complications: These are associated to most interventional treatments. They can occur alone, or in combination. They fall under one of the following six (6) categories: no benefit or worsening of symptoms; bleeding; infection; nerve damage; allergic reactions; and/or death. No benefits or worsening of symptoms: In Medicine there are no guarantees, only probabilities. No healthcare provider can ever guarantee that a medical treatment will work, they can only state the probability that it may. Furthermore, there is always the possibility that the condition may worsen, either directly, or indirectly, as a consequence of the treatment. Bleeding: This is more common if the patient is taking a blood thinner, either prescription or over the counter (example: Goody Powders, Fish oil, Aspirin, Garlic, etc.), or if suffering a condition associated with impaired coagulation (example: Hemophilia, cirrhosis of the liver, low platelet counts, etc.). However, even if you do not have one on these, it can still happen. If you have any of these conditions, or take one of these drugs, make sure to notify your treating physician. Infection: This is more common in patients with a compromised immune system, either due to disease (example:  diabetes, cancer, human immunodeficiency virus [HIV], etc.), or due to medications or treatments (example: therapies used to treat cancer and rheumatological diseases). However, even if you do not have one on these, it can still happen. If you have any of these conditions, or take  one of these drugs, make sure to notify your treating physician. Nerve Damage: This is more common when the treatment is an invasive one, but it can also happen with the use of medications, such as those used in the treatment of cancer. The damage can occur to small secondary nerves, or to large primary ones, such as those in the spinal cord and brain. This damage may be temporary or permanent and it may lead to impairments that can range from temporary numbness to permanent paralysis and/or brain death. Allergic Reactions: Any time a substance or material comes in contact with our body, there is the possibility of an allergic reaction. These can range from a mild skin rash (contact dermatitis) to a severe systemic reaction (anaphylactic reaction), which can result in death. Death: In general, any medical intervention can result in death, most of the time due to an unforeseen complication. ______________________________________________________________________

## 2022-11-23 ENCOUNTER — Ambulatory Visit (INDEPENDENT_AMBULATORY_CARE_PROVIDER_SITE_OTHER): Payer: Medicare Other | Admitting: Podiatry

## 2022-11-23 ENCOUNTER — Encounter: Payer: Self-pay | Admitting: Podiatry

## 2022-11-23 DIAGNOSIS — B351 Tinea unguium: Secondary | ICD-10-CM | POA: Diagnosis not present

## 2022-11-23 DIAGNOSIS — M2012 Hallux valgus (acquired), left foot: Secondary | ICD-10-CM

## 2022-11-23 DIAGNOSIS — E0843 Diabetes mellitus due to underlying condition with diabetic autonomic (poly)neuropathy: Secondary | ICD-10-CM

## 2022-11-23 DIAGNOSIS — M79675 Pain in left toe(s): Secondary | ICD-10-CM | POA: Diagnosis not present

## 2022-11-23 DIAGNOSIS — M79674 Pain in right toe(s): Secondary | ICD-10-CM

## 2022-11-23 DIAGNOSIS — M2042 Other hammer toe(s) (acquired), left foot: Secondary | ICD-10-CM

## 2022-11-23 DIAGNOSIS — E119 Type 2 diabetes mellitus without complications: Secondary | ICD-10-CM

## 2022-11-23 DIAGNOSIS — R2681 Unsteadiness on feet: Secondary | ICD-10-CM

## 2022-11-23 NOTE — Progress Notes (Signed)
   Chief Complaint  Patient presents with   Nail Problem    Patient is here for routine [facility] having some burning sensations in feet and has small abrasion on inner right ankle, knot on left calf inner side    SUBJECTIVE Patient with a history of diabetes mellitus presents to office today complaining of elongated, thickened nails that cause pain while ambulating in shoes.  Patient is unable to trim their own nails.   Patient also states that he has a symptomatic skin lesion to the medial aspect of the right ankle.  Onset a few weeks ago.  They have been applying triple antibiotic and a Band-Aid.    Finally, the patient is inquiring about supportive shoes to help with balance.  Patient has never had diabetic shoes before.  They are hoping the diabetic shoes with custom molded insoles with balance instability during ambulation.  patient is here for further evaluation and treatment.  Past Medical History:  Diagnosis Date   Arthritis    Diabetes (HCC)    Glaucoma    Heart attack (HCC)    HLD (hyperlipidemia)    HTN (hypertension)    Lower back pain    Sleep apnea    Tinnitus     Allergies  Allergen Reactions   Zolpidem     Other reaction(s): Other (See Comments) Confusion     OBJECTIVE General Patient is awake, alert, and oriented x 3 and in no acute distress. Derm Skin is dry and supple bilateral. Negative open lesions or macerations. Remaining integument unremarkable. Nails are tender, long, thickened and dystrophic with subungual debris, consistent with onychomycosis, 1-5 bilateral. No signs of infection noted. Superficial ulcer noted to the medial aspect of the right ankle.  Please see above noted photo.  It appears very stable Vasc  DP and PT pedal pulses palpable bilaterally. Temperature gradient within normal limits.  Neuro light touch and protective threshold sensation diminished bilaterally.  Musculoskeletal Exam No symptomatic pedal deformities noted bilateral. Muscular  strength within normal limits.  Hallux valgus deformity noted left great toe joint with hammertoe to the second digit left.  ASSESSMENT 1. Diabetes Mellitus w/ peripheral neuropathy 2.  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  Ulcer medial aspect of the right ankle 4.  Hallux valgus with hammertoe second digit left 5.  Instability of gait  PLAN OF CARE -Patient evaluated today.  Comprehensive diabetic foot exam performed today  -Instructed to maintain good pedal hygiene and foot care. Stressed importance of controlling blood sugar.  -Mechanical debridement of nails 1-5 bilaterally performed using a nail nipper. Filed with dremel without incident.  -Order placed for diabetic shoes which should help with stability of the feet.  Appointment with orthotics department for diabetic shoes and custom molded Plastizote insoles -Recommend knee-high compression socks -Return to clinic 3 months   Felecia Shelling, DPM [facility]  Dr. Felecia Shelling, DPM    [address] [place], [place] [address]                Office [phone]  Fax [phone]

## 2022-12-13 ENCOUNTER — Ambulatory Visit: Payer: Medicare Other | Attending: Pain Medicine | Admitting: Pain Medicine

## 2022-12-13 ENCOUNTER — Encounter: Payer: Self-pay | Admitting: Pain Medicine

## 2022-12-13 VITALS — BP 98/61 | HR 70 | Temp 97.3°F | Resp 16 | Ht 68.0 in | Wt 175.0 lb

## 2022-12-13 DIAGNOSIS — G8929 Other chronic pain: Secondary | ICD-10-CM | POA: Diagnosis present

## 2022-12-13 DIAGNOSIS — Z758 Other problems related to medical facilities and other health care: Secondary | ICD-10-CM | POA: Insufficient documentation

## 2022-12-13 DIAGNOSIS — M25562 Pain in left knee: Secondary | ICD-10-CM | POA: Insufficient documentation

## 2022-12-13 DIAGNOSIS — M7050 Other bursitis of knee, unspecified knee: Secondary | ICD-10-CM | POA: Diagnosis present

## 2022-12-13 DIAGNOSIS — M1712 Unilateral primary osteoarthritis, left knee: Secondary | ICD-10-CM | POA: Diagnosis present

## 2022-12-13 MED ORDER — METHYLPREDNISOLONE ACETATE 80 MG/ML IJ SUSP
80.0000 mg | Freq: Once | INTRAMUSCULAR | Status: AC
  Administered 2022-12-13: 80 mg via INTRA_ARTICULAR
  Filled 2022-12-13: qty 1

## 2022-12-13 MED ORDER — ROPIVACAINE HCL 2 MG/ML IJ SOLN
3.0000 mL | Freq: Once | INTRAMUSCULAR | Status: AC
  Administered 2022-12-13: 3 mL via INTRA_ARTICULAR
  Filled 2022-12-13: qty 20

## 2022-12-13 MED ORDER — PENTAFLUOROPROP-TETRAFLUOROETH EX AERO
INHALATION_SPRAY | Freq: Once | CUTANEOUS | Status: DC
  Filled 2022-12-13: qty 30

## 2022-12-13 MED ORDER — LIDOCAINE HCL (PF) 2 % IJ SOLN
5.0000 mL | Freq: Once | INTRAMUSCULAR | Status: AC
  Administered 2022-12-13: 5 mL
  Filled 2022-12-13: qty 5

## 2022-12-13 NOTE — Progress Notes (Signed)
PROVIDER NOTE: Interpretation of information contained herein should be left to medically-trained personnel. Specific patient instructions are provided elsewhere under "Patient Instructions" section of medical record. This document was created in part using STT-dictation technology, any transcriptional errors that may result from this process are unintentional.  Patient: Adrian Kim Type: Established DOB: October 03, 1940 MRN: [id number] PCP: [facility]  Service: Procedure DOS: 12/13/2022 Setting: Ambulatory Location: Ambulatory outpatient facility Delivery: Face-to-face Provider: Oswaldo Done, MD Specialty: Interventional Pain Management Specialty designation: 09 Location: Outpatient facility Ref. Prov.: [facility]       Interventional Therapy   Primary Reason for Visit: Interventional Pain Management Treatment. CC: Knee Pain (left)    Procedure:          Anesthesia, Analgesia, Anxiolysis:  Type: Diagnostic Pes anserine Knee Bursa Injection #1  Region: Lateral infrapatellar Knee Region Level: Knee Joint Laterality: Left knee  Type: Local Anesthesia Local Anesthetic: Lidocaine 1-2% Sedation: None  Indication(s):  Analgesia Route: Infiltration (Wood/IM) IV Access: N/A   Position: Sitting   1. Chronic knee pain (Left)   2. Pes anserine bursitis (Left)   3. Osteoarthritis of knee (Left)   4. Medial knee pain (Left)    NAS-11 Pain score:   Pre-procedure: 1 /10   Post-procedure: 0-No pain/10     H&P (Pre-op Assessment):  Adrian Kim is a 82 y.o. (year old), male patient, seen today for interventional treatment. He  has a past surgical history that includes Coronary angioplasty with stent; Colonoscopy with propofol (N/A, 12/10/2014); and Cataract extraction w/PHACO (Right, 11/16/2015). Adrian Kim has a current medication list which includes the following prescription(s): acetaminophen, alpha-lipoic acid, aspirin ec, betamethasone  valerate ointment, cetirizine, vitamin b-12, dorzolamide-timolol, fluticasone, glucose blood, ibuprofen, invokana, latanoprost, linagliptin, lisinopril, metformin, rosuvastatin, rybelsus, sitagliptin, tamsulosin, timolol, and trueplus lancets 28g, and the following Facility-Administered Medications: pentafluoroprop-tetrafluoroeth. His primarily concern today is the Knee Pain (left)  Initial Vital Signs:  Pulse/HCG Rate: 70  Temp: (!) 97.3 F (36.3 C) Resp: 16 BP: 98/61 SpO2: 96 %  BMI: Estimated body mass index is 26.61 kg/m as calculated from the following:   Height as of this encounter: 5\' 8"  (1.727 m).   Weight as of this encounter: 175 lb (79.4 kg).  Risk Assessment: Allergies: Reviewed. He is allergic to zolpidem.  Allergy Precautions: None required Coagulopathies: Reviewed. None identified.  Blood-thinner therapy: None at this time Active Infection(s): Reviewed. None identified. Adrian Kim is afebrile  Site Confirmation: Adrian Kim was asked to confirm the procedure and laterality before marking the site Procedure checklist: Completed Consent: Before the procedure and under the influence of no sedative(s), amnesic(s), or anxiolytics, the patient was informed of the treatment options, risks and possible complications. To fulfill our ethical and legal obligations, as recommended by the American Medical Association's Code of Ethics, I have informed the patient of my clinical impression; the nature and purpose of the treatment or procedure; the risks, benefits, and possible complications of the intervention; the alternatives, including doing nothing; the risk(s) and benefit(s) of the alternative treatment(s) or procedure(s); and the risk(s) and benefit(s) of doing nothing. The patient was provided information about the general risks and possible complications associated with the procedure. These may include, but are not limited to: failure to achieve desired goals, infection, bleeding,  organ or nerve damage, allergic reactions, paralysis, and death. In addition, the patient was informed of those risks and complications associated to the procedure, such as failure to decrease pain; infection; bleeding; organ or  nerve damage with subsequent damage to sensory, motor, and/or autonomic systems, resulting in permanent pain, numbness, and/or weakness of one or several areas of the body; allergic reactions; (i.e.: anaphylactic reaction); and/or death. Furthermore, the patient was informed of those risks and complications associated with the medications. These include, but are not limited to: allergic reactions (i.e.: anaphylactic or anaphylactoid reaction(s)); adrenal axis suppression; blood sugar elevation that in diabetics may result in ketoacidosis or comma; water retention that in patients with history of congestive heart failure may result in shortness of breath, pulmonary edema, and decompensation with resultant heart failure; weight gain; swelling or edema; medication-induced neural toxicity; particulate matter embolism and blood vessel occlusion with resultant organ, and/or nervous system infarction; and/or aseptic necrosis of one or more joints. Finally, the patient was informed that Medicine is not an exact science; therefore, there is also the possibility of unforeseen or unpredictable risks and/or possible complications that may result in a catastrophic outcome. The patient indicated having understood very clearly. We have given the patient no guarantees and we have made no promises. Enough time was given to the patient to ask questions, all of which were answered to the patient's satisfaction. Adrian Kim has indicated that he wanted to continue with the procedure. Attestation: I, the ordering provider, attest that I have discussed with the patient the benefits, risks, side-effects, alternatives, likelihood of achieving goals, and potential problems during recovery for the procedure that  I have provided informed consent. Date  Time: 12/13/2022 11:25 AM  Pre-Procedure Preparation:  Monitoring: As per clinic protocol. Respiration, ETCO2, SpO2, BP, heart rate and rhythm monitor placed and checked for adequate function Safety Precautions: Patient was assessed for positional comfort and pressure points before starting the procedure. Time-out: I initiated and conducted the "Time-out" before starting the procedure, as per protocol. The patient was asked to participate by confirming the accuracy of the "Time Out" information. Verification of the correct person, site, and procedure were performed and confirmed by me, the nursing staff, and the patient. "Time-out" conducted as per Joint Commission's Universal Protocol (UP.01.01.01). Time: 1147 Start Time: 1147 hrs.  Description of Procedure:          Target Area: Knee Joint Approach: Just above the Lateral tibial plateau, lateral to the infrapatellar tendon. Area Prepped: Entire knee area, from the mid-thigh to the mid-shin. ChloraPrep (2% chlorhexidine gluconate and 70% isopropyl alcohol) Safety Precautions: Aspiration looking for blood return was conducted prior to all injections. At no point did we inject any substances, as a needle was being advanced. No attempts were made at seeking any paresthesias. Safe injection practices and needle disposal techniques used. Medications properly checked for expiration dates. SDV (single dose vial) medications used. Description of the Procedure: Protocol guidelines were followed. The patient was placed in position over the fluoroscopy table. The target area was identified and the area prepped in the usual manner. Skin & deeper tissues infiltrated with local anesthetic. Appropriate amount of time allowed to pass for local anesthetics to take effect. The procedure needles were then advanced to the target area. Proper needle placement secured. Negative aspiration confirmed. Solution injected in  intermittent fashion, asking for systemic symptoms every 0.5cc of injectate. The needles were then removed and the area cleansed, making sure to leave some of the prepping solution back to take advantage of its long term bactericidal properties.        Vitals:   12/13/22 1123  BP: 98/61  Pulse: 70  Resp: 16  Temp: (!) 97.3  F (36.3 C)  TempSrc: Temporal  SpO2: 96%  Weight: 175 lb (79.4 kg)  Height: 5\' 8"  (1.727 m)    Start Time: 1147 hrs. End Time: 1149 hrs. Materials:  Needle(s) Type: Regular needle Gauge: 25G Length: 1.5-in Medication(s): Please see orders for medications and dosing details.  Type of Imaging Technique: None used Indication(s): N/A Exposure Time: No patient exposure Contrast: None used. Fluoroscopic Guidance: N/A Ultrasound Guidance: N/A Interpretation: N/A  Antibiotic Prophylaxis:   Anti-infectives (From admission, onward)    None      Indication(s): None identified  Post-operative Assessment:  Post-procedure Vital Signs:  Pulse/HCG Rate: 70  Temp: (!) 97.3 F (36.3 C) Resp: 16 BP: 98/61 SpO2: 96 %  EBL: None  Complications: No immediate post-treatment complications observed by team, or reported by patient.  Note: The patient tolerated the entire procedure well. A repeat set of vitals were taken after the procedure and the patient was kept under observation following institutional policy, for this type of procedure. Post-procedural neurological assessment was performed, showing return to baseline, prior to discharge. The patient was provided with post-procedure discharge instructions, including a section on how to identify potential problems. Should any problems arise concerning this procedure, the patient was given instructions to immediately contact us, at any time, without hesitation. In any case, we plan to contact the patient by telephone for a follow-up status report regarding this interventional procedure.  Comments:  No additional  relevant information.  Plan of Care (POC)  Orders:  Orders Placed This Encounter  Procedures   KNEE INJECTION    Local Anesthetic & Steroid injection.    Scheduling Instructions:     Side(s): Left Knee     Sedation: None     Timeframe: Today    Order Specific Question:   Where will this procedure be performed?    Answer:   [facility]   Informed Consent Details: Physician/Practitioner Attestation; Transcribe to consent form and obtain patient signature    Note: Always confirm laterality of pain with Mr. Decoteau, before procedure. Transcribe to consent form and obtain patient signature.    Order Specific Question:   Physician/Practitioner attestation of informed consent for procedure/surgical case    Answer:   I, the physician/practitioner, attest that I have discussed with the patient the benefits, risks, side effects, alternatives, likelihood of achieving goals and potential problems during recovery for the procedure that I have provided informed consent.    Order Specific Question:   Procedure    Answer:   Left-sided intra-articular knee arthrocentesis (aspiration and/or injection)    Order Specific Question:   Physician/Practitioner performing the procedure    Answer:   Layza Summa A. 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   Provide equipment / supplies at bedside    Procedure tray: "Block Tray" (Disposable  single use) Skin infiltration needle: Regular 1.5-in, 25-G, (x1) Block Needle type: Regular Amount/quantity: 1 Size: Short(1.5-inch) Gauge: (25G x1) + (22G x1)    Standing Status:   Standing    Number of Occurrences:   1    Order Specific Question:   Specify    Answer:   Block Tray   Chronic Opioid Analgesic:  None MME/day: 0 mg/day   Medications ordered for procedure: Meds ordered this encounter  Medications   lidocaine HCl (PF) (XYLOCAINE) 2 % injection 5  mL   pentafluoroprop-tetrafluoroeth (GEBAUERS) aerosol   ropivacaine (  PF) 2 mg/mL (0.2%) (NAROPIN) injection 3 mL   methylPREDNISolone acetate (DEPO-MEDROL) injection 80 mg   Medications administered: We administered lidocaine HCl (PF), ropivacaine (PF) 2 mg/mL (0.2%), and methylPREDNISolone acetate.  See the medical record for exact dosing, route, and time of administration.  Follow-up plan:   Return in about 2 weeks (around 12/27/2022) for (Face2F), (PPE).       Interventional Therapies  Risk Factors  Considerations:  Language barrier: [place] (interpreter required)  IDDM  CAD  Hx. MI  (B) Carotid A. Stenosis  HTN  AoV stenosis  Mitral & Tricuspid V. insufficiency  OSA   Planned  Pending:   Therapeutic left pes anserine bursa steroid knee inj. #1    Under consideration:      Completed:   Therapeutic bilateral IA Zilretta knee injection x1 (11/06/2022) (100/100/75/75) Therapeutic bilateral lower extremity Qutenza Tx x1 (11/06/2022) (50/50/75/75) Diagnostic/therapeutic bilateral IA steroid knee inj. x1 (05/15/2022) (9-0/10) (100/100/95-100)  (10/10/2022) Referral to orthopedic surgery   Completed by other providers:      Therapeutic  Palliative (PRN) options:   None established      Recent Visits Date Type Provider Dept  11/22/22 Office Visit Delano Metz, MD [facility]  11/06/22 Procedure visit Delano Metz, MD [facility]  10/10/22 Office Visit Delano Metz, MD [facility]  Showing recent visits within past 90 days and meeting all other requirements Today's Visits Date Type Provider Dept  12/13/22 Procedure visit Delano Metz, MD [facility]  Showing today's visits and meeting all other requirements Future Appointments Date Type Provider Dept  01/03/23 Appointment Delano Metz, MD [facility]  Showing future appointments within next 90 days and meeting all other  requirements  Disposition: Discharge home  Discharge (Date  Time): 12/13/2022; 1151 hrs.   Primary Care Physician: [facility] Location: [facility] Note by: Oswaldo Done, MD (TTS technology used. I apologize for any typographical errors that were not detected and corrected.) Date: 12/13/2022; Time: 12:18 PM  Disclaimer:  Medicine is not an Visual merchandiser. The only guarantee in medicine is that nothing is guaranteed. It is important to note that the decision to proceed with this intervention was based on the information collected from the patient. The Data and conclusions were drawn from the patient's questionnaire, the interview, and the physical examination. Because the information was provided in large part by the patient, it cannot be guaranteed that it has not been purposely or unconsciously manipulated. Every effort has been made to obtain as much relevant data as possible for this evaluation. It is important to note that the conclusions that lead to this procedure are derived in large part from the available data. Always take into account that the treatment will also be dependent on availability of resources and existing treatment guidelines, considered by other Pain Management Practitioners as being common knowledge and practice, at the time of the intervention. For Medico-Legal purposes, it is also important to point out that variation in procedural techniques and pharmacological choices are the acceptable norm. The indications, contraindications, technique, and results of the above procedure should only be interpreted and judged by a Board-Certified Interventional Pain Specialist with extensive familiarity and expertise in the same exact procedure and technique.

## 2022-12-13 NOTE — Progress Notes (Signed)
Safety precautions to be maintained throughout the outpatient stay will include: orient to surroundings, keep bed in low position, maintain call bell within reach at all times, provide assistance with transfer out of bed and ambulation.  

## 2022-12-13 NOTE — Patient Instructions (Signed)

## 2022-12-14 ENCOUNTER — Telehealth: Payer: Self-pay

## 2022-12-14 NOTE — Telephone Encounter (Signed)
Post procedure follow up.  No answer.  

## 2022-12-21 ENCOUNTER — Ambulatory Visit: Payer: Medicare Other

## 2022-12-21 DIAGNOSIS — M2042 Other hammer toe(s) (acquired), left foot: Secondary | ICD-10-CM

## 2022-12-21 DIAGNOSIS — M2012 Hallux valgus (acquired), left foot: Secondary | ICD-10-CM

## 2022-12-21 DIAGNOSIS — E0843 Diabetes mellitus due to underlying condition with diabetic autonomic (poly)neuropathy: Secondary | ICD-10-CM

## 2022-12-21 NOTE — Progress Notes (Signed)
Patient presents to the office today for diabetic shoe and insole measuring.  Patient was measured with brannock device to determine size and width for 1 pair of extra depth shoes and foam casted for 3 pair of insoles.   Documentation of medical necessity will be sent to patient's treating diabetic doctor to verify and sign.   Patient's diabetic provider: Bartholomew Crews MD  Shoes and insoles will be ordered at that time and patient will be notified for an appointment for fitting when they arrive.   Shoe size (per patient): 10 Brannock measurement: 9.5 Patient shoe selection- Shoe choice:   ABS balance shoe or any velcro black  Shoe size ordered: 10WD Financials signed

## 2023-01-03 ENCOUNTER — Ambulatory Visit: Payer: Medicare Other | Attending: Pain Medicine | Admitting: Pain Medicine

## 2023-01-03 ENCOUNTER — Encounter: Payer: Self-pay | Admitting: Pain Medicine

## 2023-01-03 VITALS — BP 96/56 | HR 82 | Temp 97.5°F | Ht 69.0 in | Wt 173.0 lb

## 2023-01-03 DIAGNOSIS — R2689 Other abnormalities of gait and mobility: Secondary | ICD-10-CM | POA: Diagnosis present

## 2023-01-03 DIAGNOSIS — R29898 Other symptoms and signs involving the musculoskeletal system: Secondary | ICD-10-CM | POA: Diagnosis present

## 2023-01-03 DIAGNOSIS — M79605 Pain in left leg: Secondary | ICD-10-CM | POA: Diagnosis present

## 2023-01-03 DIAGNOSIS — M25562 Pain in left knee: Secondary | ICD-10-CM | POA: Insufficient documentation

## 2023-01-03 DIAGNOSIS — M51372 Other intervertebral disc degeneration, lumbosacral region with discogenic back pain and lower extremity pain: Secondary | ICD-10-CM | POA: Insufficient documentation

## 2023-01-03 DIAGNOSIS — M79604 Pain in right leg: Secondary | ICD-10-CM | POA: Diagnosis present

## 2023-01-03 DIAGNOSIS — G8929 Other chronic pain: Secondary | ICD-10-CM | POA: Insufficient documentation

## 2023-01-03 DIAGNOSIS — M5441 Lumbago with sciatica, right side: Secondary | ICD-10-CM | POA: Diagnosis present

## 2023-01-03 DIAGNOSIS — M5442 Lumbago with sciatica, left side: Secondary | ICD-10-CM | POA: Insufficient documentation

## 2023-01-03 DIAGNOSIS — M79671 Pain in right foot: Secondary | ICD-10-CM | POA: Insufficient documentation

## 2023-01-03 DIAGNOSIS — M79672 Pain in left foot: Secondary | ICD-10-CM | POA: Insufficient documentation

## 2023-01-03 DIAGNOSIS — M159 Polyosteoarthritis, unspecified: Secondary | ICD-10-CM | POA: Insufficient documentation

## 2023-01-03 DIAGNOSIS — Z7984 Long term (current) use of oral hypoglycemic drugs: Secondary | ICD-10-CM

## 2023-01-03 DIAGNOSIS — M25561 Pain in right knee: Secondary | ICD-10-CM | POA: Insufficient documentation

## 2023-01-03 DIAGNOSIS — R262 Difficulty in walking, not elsewhere classified: Secondary | ICD-10-CM | POA: Diagnosis present

## 2023-01-03 DIAGNOSIS — M7050 Other bursitis of knee, unspecified knee: Secondary | ICD-10-CM | POA: Diagnosis present

## 2023-01-03 DIAGNOSIS — R52 Pain, unspecified: Secondary | ICD-10-CM | POA: Insufficient documentation

## 2023-01-03 DIAGNOSIS — E1142 Type 2 diabetes mellitus with diabetic polyneuropathy: Secondary | ICD-10-CM | POA: Insufficient documentation

## 2023-01-03 DIAGNOSIS — M7052 Other bursitis of knee, left knee: Secondary | ICD-10-CM | POA: Diagnosis not present

## 2023-01-03 DIAGNOSIS — Z09 Encounter for follow-up examination after completed treatment for conditions other than malignant neoplasm: Secondary | ICD-10-CM | POA: Diagnosis present

## 2023-01-03 NOTE — Progress Notes (Signed)
Safety precautions to be maintained throughout the outpatient stay will include: orient to surroundings, keep bed in low position, maintain call bell within reach at all times, provide assistance with transfer out of bed and ambulation.  

## 2023-01-03 NOTE — Progress Notes (Signed)
PROVIDER NOTE: Information contained herein reflects review and annotations entered in association with encounter. Interpretation of such information and data should be left to medically-trained personnel. Information provided to patient can be located elsewhere in the medical record under "Patient Instructions". Document created using STT-dictation technology, any transcriptional errors that may result from process are unintentional.    Patient: Adrian Kim  Service Category: E/M  Provider: Oswaldo Done, MD  DOB: 1940-02-24  DOS: 01/03/2023  Referring Provider: Center, Darc[facility] Gasman*  MRN: [id number]  Specialty: Interventional Pain Management  PCP: [facility]  Type: Established Patient  Setting: Ambulatory outpatient    Location: Office  Delivery: Face-to-face     HPI  [name], a 82 y.o. year old male, is here today because of his Chronic pain of left knee [M25.562, G89.29]. Mr. Krane primary complain today is Knee Pain (right)  Pertinent problems: Mr. Ferenz has Chronic pain syndrome; Chronic hand pain (Bilateral); Chronic knee pain (1ry area of Pain) (Bilateral) (R>L); Chronic hip pain (Bilateral); Chronic neck pain; 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; Chronic lower extremity pain (Bilateral) (R>L); Trigger ring finger of left hand; Chronic feet pain (Bilateral); Rib pain (Bilateral); Generalized osteoarthritis of multiple sites; Osteoarthritis of feet (Bilateral); Osteoarthritis of knees (Bilateral) (R>L); Osteoarthritis of hips (Bilateral); Vertigo; Difficulty in walking; DDD (degenerative disc disease), cervical; Osteoarthritis of facet joint of cervical spine; Cervical facet hypertrophy; Grade 1 Retrolisthesis of C3/C4; Osteoarthritis of shoulder (Right); Osteoarthritis of glenohumeral joint (Right); DDD (degenerative disc disease), lumbosacral; Lumbar facet arthropathy (L3-4, L5-S1); Lumbar facet hypertrophy; Osteoarthritis of  facet joint of lumbar spine; Tricompartment osteoarthritis of knee (Right); Osteoarthritis of hands (Bilateral); Chronic low back pain (Bilateral) w/ sciatica (Bilateral); Diabetic peripheral neuropathy (HCC); Chronic painful diabetic neuropathy (HCC); Abnormal MRI, knee (08/12/2022) (Right); Osteoarthritis of knee (Right); Complex tear of medial meniscus of knee, sequela (Right); Complex tear of lateral meniscus of knee, sequela (Right); Baker's cyst of knee (Right); Effusion of knee joint (Right); Chronic knee pain (Right); Transient synovitis of knee (Right); Burning pain; Pes anserine bursitis (Left); Medial knee pain (Left); Chronic knee pain (Left); Osteoarthritis of knee (Left); and Complaints of weakness of lower extremity on their pertinent problem list. Pain Assessment: Severity of   is reported as a 5 /10. Location: Hip Right/Denies. Onset: More than a month ago. Quality: Aching, Constant, Discomfort, Throbbing. Timing: Constant. Modifying factor(s): nothing. Vitals:  height is 5\' 9"  (1.753 m) and weight is 173 lb (78.5 kg). His temperature is 97.5 F (36.4 C) (abnormal). His blood pressure is 96/56 (abnormal) and his pulse is 82. His oxygen saturation is 96%.  BMI: Estimated body mass index is 25.55 kg/m as calculated from the following:   Height as of this encounter: 5\' 9"  (1.753 m).   Weight as of this encounter: 173 lb (78.5 kg). Last encounter: 11/22/2022. Last procedure: 12/13/2022.  Reason for encounter: post-procedure evaluation and assessment. The patient indicated 100% relief of the pain for the duration of local anesthetic followed by an ongoing 100% relief of the knee pain, as long as he is not walking. As he puts weight on it, then 50% of the prior pain returns.  He also indicates having 100% relief of the burning pain is his feet.  Today the patient comes in again with his son who has been assisting [place] with the interpretation.  It would seem that the pain that he is currently  experiencing in the lower extremities is associated with low back  pain both of which began when he stands up and attempts to walk.  Based on today's questioning and evaluation of the patient it would seem that the remainder of the pain that he is experiencing in the knees have a radicular component to it.  Review of prior imaging demonstrates the patient to have severely degenerated and extensively torn medial meniscus of the right knee with radial tears involving the posterior horn of the lateral meniscus and oblique [place] superior articular surface tears involving the anterior horn mid body junction region.  He has tricompartmental degenerative changes well or severe in the medial compartment.  He also has some small to moderate-sized joint effusions and areas of synovial inflammation as well as a small to moderate-sized Baker's cyst.  He has a 2 cm synovial mass likely nodular synovitis.   Review of prior imaging studies of the lumbar spine also show levoscoliosis of the thoracolumbar region with straightening of the normal lordosis.  There is also moderately diffuse disc space narrowing and spurring from L1-2 through L5-S1.  There is multilevel facet hypertrophy most prominent at L3-4 and L5-S1.  Today we will be ordering an MRI of the lumbar spine for further evaluation.  Review of this patient's case shows his lower extremity pain to be multifactorial.  Based on the patient's age (82) signs and symptoms, I also suspect him to be having lumbar spinal stenosis contributing to his lower extremity problems, especially his lower extremity weakness and symptoms associated with his inability to ambulate, and he has neurogenic claudication.  Post-procedure evaluation    Procedure:          Anesthesia, Analgesia, Anxiolysis:  Type: Diagnostic Pes anserine Knee Bursa Injection #1  Region: Lateral infrapatellar Knee Region Level: Knee Joint Laterality: Left knee  Type: Local Anesthesia Local Anesthetic:  Lidocaine 1-2% Sedation: None  Indication(s):  Analgesia Route: Infiltration ([place]/IM) IV Access: N/A   Position: Sitting   1. Chronic knee pain (Left)   2. Pes anserine bursitis (Left)   3. Osteoarthritis of knee (Left)   4. Medial knee pain (Left)    NAS-11 Pain score:   Pre-procedure: 1 /10   Post-procedure: 0-No pain/10      Effectiveness:  Initial hour after procedure: 100 % . Subsequent 4-6 hours post-procedure: 100 % . Analgesia past initial 6 hours: 50 % (no pain until he start walking then 50%) . Ongoing improvement:  Analgesic: The patient indicated 100% relief of the pain for the duration of local anesthetic followed by an ongoing 100% relief of the knee pain, as long as he is not walking. As he puts weight on it, then 50% of the prior pain returns.  He also indicates having 100% relief of the burning pain is his feet. Function: Mr. Ifft reports improvement in function ROM: Mr. Bayley reports improvement in ROM  Pharmacotherapy Assessment  Analgesic: None MME/day: 0 mg/day   Monitoring: [place] PMP: PDMP reviewed during this encounter.       Pharmacotherapy: No side-effects or adverse reactions reported. Compliance: No problems identified. Effectiveness: Clinically acceptable.  Brigitte Pulse, RN  01/03/2023  2:41 PM  Sign when Signing Visit Safety precautions to be maintained throughout the outpatient stay will include: orient to surroundings, keep bed in low position, maintain call bell within reach at all times, provide assistance with transfer out of bed and ambulation.     No results found for: "CBDTHCR" No results found for: "D8THCCBX" No results found for: "D9THCCBX"  UDS:  Summary  Date  Value Ref Range Status  04/09/2022 Note  Final    Comment:    ==================================================================== Compliance Drug Analysis, Ur ==================================================================== Test                              Result       Flag       Units  Drug Present not Declared for Prescription Verification   Alcohol, Ethyl                 0.048        UNEXPECTED g/dL    Sources of ethyl alcohol include alcoholic beverages or as a    fermentation product of glucose; glucose is present in this specimen.    Interpret result with caution, as the presence of ethyl alcohol is    likely due, at least in part, to fermentation of glucose.  Drug Absent but Declared for Prescription Verification   Acetaminophen                  Not Detected UNEXPECTED    Acetaminophen, as indicated in the declared medication list, is not    always detected even when used as directed.    Salicylate                     Not Detected UNEXPECTED    Aspirin, as indicated in the declared medication list, is not always    detected even when used as directed.    Ibuprofen                      Not Detected UNEXPECTED    Ibuprofen, as indicated in the declared medication list, is not    always detected even when used as directed.  ==================================================================== Test                      Result    Flag   Units      Ref Range   Creatinine              39               mg/dL      >=56 ==================================================================== Declared Medications:  The flagging and interpretation on this report are based on the  following declared medications.  Unexpected results may arise from  inaccuracies in the declared medications.   **Note: The testing scope of this panel does not include small to  moderate amounts of these reported medications:   Acetaminophen (Tylenol)  Aspirin  Ibuprofen   **Note: The testing scope of this panel does not include the  following reported medications:   Betamethasone  Canagliflozin (Invokana)  Cetirizine (Zyrtec)  Dorzolamide  Fluticasone  Latanoprost (Xalatan)  Linagliptin (Tradjenta)  Lisinopril (Zestril)  Metformin  Rosuvastatin  (Crestor)  Semaglutide (Rybelsus)  Sitagliptin (Januvia)  Tamsulosin (Flomax)  Timolol (Timoptic) ==================================================================== For clinical consultation, please call [phone]. ====================================================================       ROS  Constitutional: Denies any fever or chills Gastrointestinal: No reported hemesis, hematochezia, vomiting, or acute GI distress Musculoskeletal: Denies any acute onset joint swelling, redness, loss of ROM, or weakness Neurological: No reported episodes of acute onset apraxia, aphasia, dysarthria, agnosia, amnesia, paralysis, loss of coordination, or loss of consciousness  Medication Review  Semaglutide, TRUEplus Lancets 28G, Vitamin B-12, acetaminophen, aspirin EC, betamethasone valerate ointment, canagliflozin, cetirizine, dorzolamide-timolol, fluticasone, glucose blood, ibuprofen, latanoprost, linagliptin, lisinopril, metFORMIN,  rosuvastatin, sitaGLIPtin, tamsulosin, and timolol  History Review  Allergy: Mr. Hoeg is allergic to zolpidem. Drug: Mr. Cotton  reports no history of drug use. Alcohol:  reports no history of alcohol use. Tobacco:  reports that he has quit smoking. He has never used smokeless tobacco. Social: Mr. Augsburger  reports that he has quit smoking. He has never used smokeless tobacco. He reports that he does not drink alcohol and does not use drugs. Medical:  has a past medical history of Arthritis, Diabetes (HCC), Glaucoma, Heart attack (HCC), HLD (hyperlipidemia), HTN (hypertension), Lower back pain, Sleep apnea, and Tinnitus. Surgical: Mr. Milch  has a past surgical history that includes Coronary angioplasty with stent; Colonoscopy with propofol (N/A, 12/10/2014); and Cataract extraction w/PHACO (Right, 11/16/2015). Family: family history includes Leukemia in his son.  Laboratory Chemistry Profile   Renal Lab Results  Component Value Date   BUN 14 04/09/2022    CREATININE 0.80 04/09/2022   GFRNONAA >60 04/09/2022    Hepatic Lab Results  Component Value Date   AST 16 04/09/2022   ALT 12 04/09/2022   ALBUMIN 4.4 04/09/2022   ALKPHOS 57 04/09/2022    Electrolytes Lab Results  Component Value Date   NA 136 04/09/2022   K 4.4 04/09/2022   CL 103 04/09/2022   CALCIUM 9.2 04/09/2022   MG 2.1 04/09/2022    Bone Lab Results  Component Value Date   25OHVITD1 6.8 (L) 04/09/2022   25OHVITD2 <1.0 04/09/2022   25OHVITD3 6.8 04/09/2022    Inflammation (CRP: Acute Phase) (ESR: Chronic Phase) Lab Results  Component Value Date   CRP 0.5 04/09/2022   ESRSEDRATE 4 04/09/2022         Note: Above Lab results reviewed.  Recent Imaging Review  MR KNEE RIGHT WO CONTRAST CLINICAL DATA:  Right knee pain.  EXAM: MRI OF THE RIGHT KNEE WITHOUT CONTRAST  TECHNIQUE: Multiplanar, multisequence MR imaging of the knee was performed. No intravenous contrast was administered.  COMPARISON:  Radiographs 04/09/2022  FINDINGS: Examination is limited due to patient motion.  MENISCI  Medial meniscus: Severely degenerated and extensively torn. Inferior articular surface flap type tear with flipped meniscal fragment in the medial gutter. There is also an adjacent large synovial "mass" measuring approximately 2 cm wrapping around the posterior aspect of the tibia. Focal synovitis or possible nodular synovitis.  Lateral meniscus: Radial tear involving the posterior horn and oblique coursing superior articular surface tear involving the anterior horn midbody junction region.  LIGAMENTS  Cruciates:  Intact.  Advanced mucoid degeneration of the ACL.  Collaterals:  Intact  CARTILAGE  Patellofemoral:  Advanced degenerative chondrosis.  Medial: Severe degenerative chondrosis with full-thickness cartilage loss, joint space narrowing and spurring.  Lateral:  Moderate to advanced degenerative chondrosis.  Joint: Small to moderate-sized joint  effusion and areas of synovial inflammation.  Popliteal Fossa: No popliteal mass. Small to moderate-sized Baker's cyst.  Extensor Mechanism: The patella retinacular structures are intact and the quadriceps and patellar tendons are intact.  Bones:  No acute bony findings.  Other: Unremarkable knee musculature.  IMPRESSION: 1. Severely degenerated and extensively torn medial meniscus. 2. Radial tear involving the posterior horn of the lateral meniscus and oblique coursing superior articular surface tear involving the anterior horn midbody junction region. 3. Intact ligamentous structures and no acute bony findings. 4. Tricompartmental degenerative changes most severe in the medial compartment. 5. Small to moderate-sized joint effusion and areas of synovial inflammation. Small to moderate-sized Baker's cyst. 2 cm synovial "mass" as detailed  above, likely nodular synovitis.  Electronically Signed   By: Rudie Meyer M.D.   On: 08/12/2022 07:25 Note: Reviewed        Physical Exam  General appearance: Well nourished, well developed, and well hydrated. In no apparent acute distress Mental status: Alert, oriented x 3 (person, place, & time)       Respiratory: No evidence of acute respiratory distress Eyes: PERLA Vitals: BP (!) 96/56   Pulse 82   Temp (!) 97.5 F (36.4 C)   Ht 5\' 9"  (1.753 m)   Wt 173 lb (78.5 kg)   SpO2 96%   BMI 25.55 kg/m  BMI: Estimated body mass index is 25.55 kg/m as calculated from the following:   Height as of this encounter: 5\' 9"  (1.753 m).   Weight as of this encounter: 173 lb (78.5 kg). Ideal: Ideal body weight: 70.7 kg (155 lb 13.8 oz) Adjusted ideal body weight: 73.8 kg (162 lb 11.5 oz)  Assessment   Diagnosis Status  1. Chronic knee pain (Left)   2. Pes anserine bursitis (Left)   3. Medial knee pain (Left)   4. Chronic knee pain (1ry area of Pain) (Bilateral) (R>L)   5. Chronic feet pain (Bilateral)   6. Burning pain   7. Postop check    8. Difficulty in walking   9. Diabetic peripheral neuropathy (HCC)   10. Generalized osteoarthritis of multiple sites   11. Balance problem   12. Complaints of weakness of lower extremity   13. Chronic low back pain (Bilateral) w/ sciatica (Bilateral)   14. Chronic lower extremity pain (Bilateral) (R>L)   15. Degeneration of intervertebral disc of lumbosacral region with discogenic back pain and lower extremity pain    Controlled Controlled Controlled   Updated Problems: No problems updated.   Plan of Care  Problem-specific:  No problem-specific Assessment & Plan notes found for this encounter.  Mr. Ercell Beshara has a current medication list which includes the following long-term medication(s): cetirizine, fluticasone, linagliptin, lisinopril, metformin, rosuvastatin, sitagliptin, and vitamin b-12.  Pharmacotherapy (Medications Ordered): No orders of the defined types were placed in this encounter.  Orders:  Orders Placed This Encounter  Procedures   MR LUMBAR SPINE WO CONTRAST    Patient presents with axial pain with possible radicular component. Please assist [place] in identifying specific level(s) and laterality of any additional findings such as: 1. Facet (Zygapophyseal) joint DJD (Hypertrophy, space narrowing, subchondral sclerosis, and/or osteophyte formation) 2. DDD and/or IVDD (Loss of disc height, desiccation, gas patterns, osteophytes, endplate sclerosis, or "Black disc disease") 3. Pars defects 4. Spondylolisthesis, spondylosis, and/or spondyloarthropathies (include Degree/Grade of displacement in mm) (stability) 5. Vertebral body Fractures (acute/chronic) (state percentage of collapse) 6. Demineralization (osteopenia/osteoporotic) 7. Bone pathology 8. Foraminal narrowing  9. Surgical changes 10. Central, Lateral Recess, and/or Foraminal Stenosis (include AP diameter of stenosis in mm) 11. Surgical changes (hardware type, status, and presence of fibrosis) 12.  Modic Type Changes (MRI only) 13. IVDD (Disc bulge, protrusion, herniation, extrusion) (Level, laterality, extent)    Standing Status:   Future    Number of Occurrences:   1    Standing Expiration Date:   04/05/2023    Scheduling Instructions:     Please make sure that the patient understands that this needs to be done as soon as possible. Never have the patient do the imaging "just before the next appointment". Inform patient that having the imaging done within the [facility] will expedite the availability of the results and  will provide      imaging availability to the requesting physician. In addition inform the patient that the imaging order has an expiration date and will not be renewed if not done within the active period.    Order Specific Question:   What is the patient's sedation requirement?    Answer:   No Sedation    Order Specific Question:   Does the patient have a pacemaker or implanted devices?    Answer:   No    Order Specific Question:   Preferred imaging location?    Answer:   [facility] (table limit-350lbs)    Order Specific Question:   Call Results- Best Contact Number?    Answer:   [phone] [facility]    Order Specific Question:   Radiology Contrast Protocol - do NOT remove file path    Answer:   [email or web address]   Ambulatory referral to Physical Therapy    Referral Priority:   Routine    Referral Type:   Physical Medicine    Referral Reason:   Specialty Services Required    Requested Specialty:   Physical Therapy    Number of Visits Requested:   1   Nursing Instructions:    Please complete this patient's postprocedure evaluation.    Scheduling Instructions:     Please complete this patient's postprocedure evaluation.   Follow-up plan:   Return for Eval-day (M,W), (F2F), for review of ordered tests (After lumbar MRI results are available.).      Interventional  Therapies  Risk Factors  Considerations:  Language barrier: [place] (interpreter required)  IDDM  CAD  Hx. MI  (B) Carotid A. Stenosis  HTN  AoV stenosis  Mitral & Tricuspid V. insufficiency  OSA   Planned  Pending:   Therapeutic left pes anserine bursa steroid knee inj. #1    Under consideration:      Completed:   Therapeutic bilateral IA Zilretta knee injection x1 (11/06/2022) (100/100/75/75) Therapeutic bilateral lower extremity Qutenza Tx x1 (11/06/2022) (50/50/75/75) Diagnostic/therapeutic bilateral IA steroid knee inj. x1 (05/15/2022) (9-0/10) (100/100/95-100)  (10/10/2022) Referral to orthopedic surgery   Completed by other providers:      Therapeutic  Palliative (PRN) options:   None established       Recent Visits Date Type Provider Dept  01/03/23 Office Visit Delano Metz, MD [facility]  12/13/22 Procedure visit Delano Metz, MD [facility]  11/22/22 Office Visit Delano Metz, MD [facility]  11/06/22 Procedure visit Delano Metz, MD [facility]  10/10/22 Office Visit Delano Metz, MD [facility]  Showing recent visits within past 90 days and meeting all other requirements Future Appointments No visits were found meeting these conditions. Showing future appointments within next 90 days and meeting all other requirements  I discussed the assessment and treatment plan with the patient. The patient was provided an opportunity to ask questions and all were answered. The patient agreed with the plan and demonstrated an understanding of the instructions.  Patient advised to call back or seek an in-person evaluation if the symptoms or condition worsens.  Duration of encounter: 35 minutes.  Total time on encounter, as per AMA guidelines included both the face-to-face and non-face-to-face time personally spent by the physician and/or other qualified health care professional(s) on the  day of the encounter (includes time in activities that require the physician or other qualified health care professional and does not include time in activities normally  performed by clinical staff). Physician's time may include the following activities when performed: Preparing to see the patient (e.g., pre-charting review of records, searching for previously ordered imaging, lab work, and nerve conduction tests) Review of prior analgesic pharmacotherapies. Reviewing PMP Interpreting ordered tests (e.g., lab work, imaging, nerve conduction tests) Performing post-procedure evaluations, including interpretation of diagnostic procedures Obtaining and/or reviewing separately obtained history Performing a medically appropriate examination and/or evaluation Counseling and educating the patient/family/caregiver Ordering medications, tests, or procedures Referring and communicating with other health care professionals (when not separately reported) Documenting clinical information in the electronic or other health record Independently interpreting results (not separately reported) and communicating results to the patient/ family/caregiver Care coordination (not separately reported)  Note by: Oswaldo Done, MD Date: 01/03/2023; Time: 9:25 PM

## 2023-01-06 ENCOUNTER — Ambulatory Visit
Admission: RE | Admit: 2023-01-06 | Discharge: 2023-01-06 | Disposition: A | Discharge status: Home / Self Care | Payer: Medicare Other | Source: Ambulatory Visit | Attending: Pain Medicine | Admitting: Pain Medicine

## 2023-01-06 DIAGNOSIS — R262 Difficulty in walking, not elsewhere classified: Secondary | ICD-10-CM | POA: Diagnosis present

## 2023-01-06 DIAGNOSIS — M51372 Other intervertebral disc degeneration, lumbosacral region with discogenic back pain and lower extremity pain: Secondary | ICD-10-CM | POA: Insufficient documentation

## 2023-01-06 DIAGNOSIS — R29898 Other symptoms and signs involving the musculoskeletal system: Secondary | ICD-10-CM | POA: Insufficient documentation

## 2023-01-06 DIAGNOSIS — M79604 Pain in right leg: Secondary | ICD-10-CM | POA: Insufficient documentation

## 2023-01-06 DIAGNOSIS — G8929 Other chronic pain: Secondary | ICD-10-CM | POA: Insufficient documentation

## 2023-01-06 DIAGNOSIS — R2689 Other abnormalities of gait and mobility: Secondary | ICD-10-CM | POA: Diagnosis present

## 2023-01-06 DIAGNOSIS — M79605 Pain in left leg: Secondary | ICD-10-CM | POA: Insufficient documentation

## 2023-01-06 DIAGNOSIS — M5442 Lumbago with sciatica, left side: Secondary | ICD-10-CM | POA: Insufficient documentation

## 2023-01-06 DIAGNOSIS — M5441 Lumbago with sciatica, right side: Secondary | ICD-10-CM | POA: Insufficient documentation

## 2023-01-26 ENCOUNTER — Emergency Department: Payer: Medicare Other

## 2023-01-26 ENCOUNTER — Other Ambulatory Visit: Payer: Self-pay

## 2023-01-26 ENCOUNTER — Emergency Department
Admission: EM | Admit: 2023-01-26 | Discharge: 2023-01-26 | Disposition: A | Discharge status: Home / Self Care | Payer: Medicare Other | Attending: Emergency Medicine | Admitting: Emergency Medicine

## 2023-01-26 DIAGNOSIS — R42 Dizziness and giddiness: Secondary | ICD-10-CM | POA: Insufficient documentation

## 2023-01-26 DIAGNOSIS — E119 Type 2 diabetes mellitus without complications: Secondary | ICD-10-CM | POA: Diagnosis not present

## 2023-01-26 DIAGNOSIS — I2510 Atherosclerotic heart disease of native coronary artery without angina pectoris: Secondary | ICD-10-CM | POA: Insufficient documentation

## 2023-01-26 DIAGNOSIS — I10 Essential (primary) hypertension: Secondary | ICD-10-CM | POA: Diagnosis not present

## 2023-01-26 LAB — BASIC METABOLIC PANEL
Anion gap: 10 (ref 5–15)
BUN: 9 mg/dL (ref 8–23)
CO2: 26 mmol/L (ref 22–32)
Calcium: 9.1 mg/dL (ref 8.9–10.3)
Chloride: 101 mmol/L (ref 98–111)
Creatinine, Ser: 0.65 mg/dL (ref 0.61–1.24)
GFR, Estimated: >60 mL/min (ref >60)
Glucose, Bld: 199 mg/dL — ABNORMAL HIGH (ref 70–99)
Potassium: 3.8 mmol/L (ref 3.5–5.1)
Sodium: 137 mmol/L (ref 135–145)

## 2023-01-26 LAB — CBC
HCT: 41.7 % (ref 39.0–52.0)
Hemoglobin: 14.1 g/dL (ref 13.0–17.0)
MCH: 31.4 pg (ref 26.0–34.0)
MCHC: 33.8 g/dL (ref 30.0–36.0)
MCV: 92.9 fL (ref 80.0–100.0)
Platelets: 170 10*3/uL (ref 150–400)
RBC: 4.49 MIL/uL (ref 4.22–5.81)
RDW: 13.1 % (ref 11.5–15.5)
WBC: 5.6 10*3/uL (ref 4.0–10.5)
nRBC: 0 % (ref 0.0–0.2)

## 2023-01-26 MED ORDER — MECLIZINE HCL 25 MG PO TABS: 25.0000 mg | ORAL_TABLET | Freq: Once | ORAL | Status: DC

## 2023-01-26 MED ORDER — MECLIZINE HCL 12.5 MG PO TABS: 12.5000 mg | ORAL_TABLET | Freq: Three times a day (TID) | ORAL | 0 refills | Status: DC | PRN

## 2023-01-26 MED ORDER — MECLIZINE HCL 25 MG PO TABS
12.5000 mg | ORAL_TABLET | Freq: Once | ORAL | Status: AC
  Administered 2023-01-26: 12.5 mg via ORAL
  Filled 2023-01-26: qty 1

## 2023-01-26 MED ORDER — IOHEXOL 350 MG/ML SOLN
75.0000 mL | Freq: Once | INTRAVENOUS | Status: AC | PRN
  Administered 2023-01-26: 75 mL via INTRAVENOUS

## 2023-01-26 NOTE — ED Notes (Signed)
Returned from scans

## 2023-01-26 NOTE — ED Notes (Signed)
Patient to CT.

## 2023-01-26 NOTE — ED Triage Notes (Signed)
Pt to ED with family reports dizziness today while moving positions. Reports worsening dizziness with movement. Family reports has had problems with balance chronically.

## 2023-01-26 NOTE — ED Provider Notes (Signed)
[facility] Provider Note    Event Date/Time   First MD Initiated Contact with Patient 01/26/23 0848     (approximate)   History   Dizziness   HPI  Adrian Kim is a 82 y.o. male   Past medical history of chronic pain, diabetes, arthritis, hypertension hyperlipidemia, CAD, here with dizziness last known normal last night when going to bed.  He speaks minimal [place], [place] is his primary language, offered interpreter but patient prefers to use his sons who are at bedside.  One of the sons lives with the patient.  Sons also report due to his baseline dementia is unable to provide substantial history.  The sons give collateral information, note that the patient has chronic pain, baseline mobility/imbalance issues and uses a cane/holds onto furniture to get around the house chronically.  However morning he bent over to tie his shoes and got very off balance, dizzy, felt like he was going to pass out which was new for him.  Patient continues to feel "drunk" sense of imbalance.  He feels that as he sits up and moves around the sensation is particularly worse.  He has some discomfort in his neck/base of head.    Denies nausea, chest pain, palpitations, or any other associated acute medical complaints.  No recent illnesses.  Independent Historian contributed to assessment above: His sons are at bedside to corroborate information past medical history as above     Physical Exam   Triage Vital Signs: ED Triage Vitals [01/26/23 0730]  Encounter Vitals Group     BP 125/74     Systolic BP Percentile      Diastolic BP Percentile      Pulse Rate 78     Resp 18     Temp 97.7 F (36.5 C)     Temp src      SpO2 100 %     Weight 180 lb (81.6 kg)     Height 5\' 9"  (1.753 m)     Head Circumference      Peak Flow      Pain Score 0     Pain Loc      Pain Education      Exclude from Growth Chart     Most recent vital signs: Vitals:   01/26/23 0730  BP:  125/74  Pulse: 78  Resp: 18  Temp: 97.7 F (36.5 C)  SpO2: 100%    General: Awake, no distress.  CV:  Good peripheral perfusion.  Resp:  Normal effort.  Abd:  No distention.  Other:  Pleasant gentleman in no acute distress with normal vital signs.  No motor or sensory deficits no facial asymmetry or dysarthria.  No obvious nystagmus though is not very compliant with extraocular movement instructions.  When he stands, he feels off balance and begins to sway forward and backwards and needs to sit down quickly.   ED Results / Procedures / Treatments   Labs (all labs ordered are listed, but only abnormal results are displayed) Labs Reviewed  BASIC METABOLIC PANEL - Abnormal; Notable for the following components:      Result Value   Glucose, Bld 199 (*)    All other components within normal limits  CBC     I ordered and reviewed the above labs they are notable for blood glucose is 199 otherwise cell counts and electrolytes are unremarkable.  EKG  ED ECG REPORT I, Pilar Jarvis, the attending physician, personally viewed and interpreted  this ECG.   Date: 01/26/2023  EKG Time: 0735  Rate: 78  Rhythm: sinus  Axis: nl  Intervals:none  ST&T Change: no stemi    RADIOLOGY I independently reviewed and interpreted CT angiogram of the head and neck and see no obvious bleeding or midline shift I also reviewed radiologist's formal read.   PROCEDURES:  Critical Care performed: No  Procedures   MEDICATIONS ORDERED IN ED: Medications  meclizine (ANTIVERT) tablet 12.5 mg (12.5 mg Oral Given 01/26/23 0944)     IMPRESSION / MDM / ASSESSMENT AND PLAN / ED COURSE  I reviewed the triage vital signs and the nursing notes.                                Patient's presentation is most consistent with acute presentation with potential threat to life or bodily function.  Differential diagnosis includes, but is not limited to, peripheral vertigo versus central vertigo, metabolic  derangements, deconditioning or worsening baseline mobility.  Considered but less likely ACS or dysrhythmia   The patient is on the cardiac monitor to evaluate for evidence of arrhythmia and/or significant heart rate changes.  MDM:    Most likely peripheral vertigo with onset last known normal last night with worse dizziness during head movements.  However given some pain at the base of his head/neck, risk factors and age, sense of imbalance "drunk" sensation would like to check for central causes like stroke, dissection, will order CT angiogram vessel imaging head and neck as well as MRI brain.  Give meclizine.  If these imaging are normal, plan will be for DC and ENT follow-up.    Unremarkable workup today.  Patient stable.  Likely peripheral vertigo.  Discharged with meclizine prescription and ENT follow-up.     FINAL CLINICAL IMPRESSION(S) / ED DIAGNOSES   Final diagnoses:  Dizziness  Vertigo     Rx / DC Orders   ED Discharge Orders     None        Note:  This document was prepared using Dragon voice recognition software and may include unintentional dictation errors.    Pilar Jarvis, MD 01/26/23 [phone]

## 2023-01-26 NOTE — Discharge Instructions (Addendum)
Fortunately your evaluation in the emergency department did not show any emergency findings like bleeding inside of the brain or stroke.  Take meclizine as prescribed.  Call the ENT (Ear, Nose, Throat)  doctor for follow-up appointment this week.  Thank you for choosing [place] for your health care today!  Please see your primary doctor this week for a follow up appointment.   If you have any new, worsening, or unexpected symptoms call your doctor right away or come back to the emergency department for reevaluation.  It was my pleasure to care for you today.   Daneil Dan [place] Charon, MD

## 2023-01-26 NOTE — ED Notes (Signed)
 CCMD called to monitor patient.

## 2023-01-28 ENCOUNTER — Encounter: Payer: Self-pay | Admitting: Pain Medicine

## 2023-01-28 DIAGNOSIS — R937 Abnormal findings on diagnostic imaging of other parts of musculoskeletal system: Secondary | ICD-10-CM | POA: Insufficient documentation

## 2023-02-15 ENCOUNTER — Telehealth: Payer: Self-pay

## 2023-02-15 ENCOUNTER — Ambulatory Visit (INDEPENDENT_AMBULATORY_CARE_PROVIDER_SITE_OTHER): Payer: Medicare Other

## 2023-02-15 DIAGNOSIS — M2012 Hallux valgus (acquired), left foot: Secondary | ICD-10-CM

## 2023-02-15 DIAGNOSIS — M2042 Other hammer toe(s) (acquired), left foot: Secondary | ICD-10-CM

## 2023-02-15 DIAGNOSIS — M2141 Flat foot [pes planus] (acquired), right foot: Secondary | ICD-10-CM | POA: Diagnosis not present

## 2023-02-15 DIAGNOSIS — E0843 Diabetes mellitus due to underlying condition with diabetic autonomic (poly)neuropathy: Secondary | ICD-10-CM

## 2023-02-15 NOTE — Telephone Encounter (Signed)
Shoes and inserts are in [facility] appt 12/27

## 2023-04-28 NOTE — Progress Notes (Unsigned)
 PROVIDER NOTE: Information contained herein reflects review and annotations entered in association with encounter. Interpretation of such information and data should be left to medically-trained personnel. Information provided to patient can be located elsewhere in the medical record under "Patient Instructions". Document created using STT-dictation technology, any transcriptional errors that may result from process are unintentional.    Patient: Adrian Kim  Service Category: E/M  Provider: Oswaldo Done, MD  DOB: Mar 19, 1940  DOS: 04/29/2023  Referring Provider: [facility], Adrian Kim*  MRN: [id number]  Specialty: Interventional Pain Management  PCP: [facility]  Type: Established Patient  Setting: Ambulatory outpatient    Location: Office  Delivery: Face-to-face     HPI  [name], a 83 y.o. year old male, is here today because of his Bilateral chronic knee pain [M25.561, M25.562, G89.29]. [name] primary complain today is Knee Pain (Right ), Hip Pain (Bilateral ), and Leg Pain (R>L)  Pertinent problems: Mr. Spickler has Chronic pain syndrome; Chronic hand pain (Bilateral); Chronic knee pain (1ry area of Pain) (Bilateral) (R>L); Chronic hip pain (Bilateral); Chronic neck pain; 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; Chronic lower extremity pain (Bilateral) (R>L); Trigger ring finger of left hand; Chronic feet pain (Bilateral); Rib pain (Bilateral); Generalized osteoarthritis of multiple sites; Osteoarthritis of feet (Bilateral); Osteoarthritis of knees (Bilateral) (R>L); Osteoarthritis of hips (Bilateral); Vertigo; Difficulty in walking; DDD (degenerative disc disease), cervical; Osteoarthritis of facet joint of cervical spine; Cervical facet hypertrophy; Grade 1 Retrolisthesis of C3/C4; Osteoarthritis of shoulder (Right); Osteoarthritis of glenohumeral joint (Right); DDD (degenerative disc disease), lumbosacral; Lumbar facet arthropathy (L3-4,  L5-S1); Lumbar facet hypertrophy; Osteoarthritis of facet joint of lumbar spine; Tricompartment osteoarthritis of knee (Right); Osteoarthritis of hands (Bilateral); Chronic low back pain (Bilateral) w/ sciatica (Bilateral); Diabetic peripheral neuropathy (HCC); Chronic painful diabetic neuropathy (HCC); Abnormal MRI, knee (08/12/2022) (Right); Osteoarthritis of knee (Right); Complex tear of medial meniscus of knee, sequela (Right); Complex tear of lateral meniscus of knee, sequela (Right); Baker's cyst of knee (Right); Effusion of knee joint (Right); Chronic knee pain (Right); Transient synovitis of knee (Right); Burning pain; Pes anserine bursitis (Left); Medial knee pain (Left); Chronic knee pain (Left); Osteoarthritis of knee (Left); Complaints of weakness of lower extremity; Abnormal MRI, lumbar spine (01/26/2023); and Spinal stenosis, lumbar region, 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 Pain Assessment: Severity of Chronic pain is reported as a 10-Worst pain ever/10. Location: Hip Right, Left/Radaites from hips bilateral into legs bialteral down to mid ankles bilateral. Onset: More than a month ago. Quality: Constant, Aching, Shooting, Sharp. Timing: Constant. Modifying factor(s): Sitting and rest. Vitals:  height is 5\' 9"  (1.753 m) and weight is 180 lb (81.6 kg). His temporal temperature is 97.5 F (36.4 C) (abnormal). His blood pressure is 90/55 (abnormal) and his pulse is 83. His respiration is 18 and oxygen saturation is 98%.  BMI: Estimated body mass index is 26.58 kg/m as calculated from the following:   Height as of this encounter: 5\' 9"  (1.753 m).   Weight as of this encounter: 180 lb (81.6 kg). Last encounter: 01/03/2023. Last procedure: 12/13/2022.  Reason for encounter: follow-up evaluation.  The patient had an MRI of the lumbar spine as well as an MRI of the right knee Kim.  The right knee MRI was Kim on 08/12/2022 and then Lumbar spine MRI on  01/26/2023.  The MRI of the right knee indicated: 1. Severely degenerated and extensively torn medial meniscus. 2. Radial tear involving the  posterior horn of the lateral meniscus and oblique coursing superior articular surface tear involving the anterior horn midbody junction region. 3. Intact ligamentous structures and no acute bony findings. 4. Tricompartmental degenerative changes most severe in the medial compartment. 5. Small to moderate-sized joint effusion and areas of synovial inflammation. Small to moderate-sized Baker's cyst. 2 cm synovial "mass" as detailed above, likely nodular synovitis.  The MRI of the lumbar spine indicated: 1. Multilevel spondylosis appears worst at L2-3 where there is moderately severe central canal stenosis. 2. Mild to moderate right foraminal narrowing at T12-L1 and L3-4. 3. Mild right lateral recess and foraminal narrowing at L1-2. 4. Mild to moderate central canal and left foraminal stenosis at L4-5. 5. Moderate left foraminal stenosis L5-S1.  Discussed the use of AI scribe software for clinical note transcription with the patient, who gave verbal consent to proceed.  History of Present Illness   The patient presents with right knee pain and lumbar spine issues.  He has right knee pain, specifically on the medial aspect, which has persisted despite previous treatments. A prior injection provided some relief. He experiences difficulty walking, noting that he tends to put all his weight on the affected knee, resulting in a 'wobble' rather than a limp.  He also experiences back pain, which may be related to his altered gait. An MRI of the lumbar spine revealed disc bulges and moderate to severe central canal stenosis at the L2-3 level. He has pain in both thighs and hips, and notes that sitting alleviates the pain. No burning sensation in the feet, indicating that the [place] treatment has been effective in managing this symptom. He uses a cream for any  recurring burning sensations. No use of blood thinners.      Pharmacotherapy Assessment  Analgesic: No chronic opioid analgesics therapy prescribed by our practice. None MME/day: 0 mg/day   Monitoring: [place] PMP: PDMP reviewed during this encounter.       Pharmacotherapy: No side-effects or adverse reactions reported. Compliance: No problems identified. Effectiveness: Clinically acceptable.  Adrian Iba, RN  04/29/2023  8:35 AM  Sign when Signing Visit Safety precautions to be maintained throughout the outpatient stay will include: orient to surroundings, keep bed in low position, maintain call bell within reach at all times, provide assistance with transfer out of bed and ambulation.     No results found for: "CBDTHCR" No results found for: "D8THCCBX" No results found for: "D9THCCBX"  UDS:  Summary  Date Value Ref Range Status  04/09/2022 Note  Final    Comment:    ==================================================================== Compliance Drug Analysis, Ur ==================================================================== Test                             Result       Flag       Units  Drug Present not Declared for Prescription Verification   Alcohol, Ethyl                 0.048        UNEXPECTED g/dL    Sources of ethyl alcohol include alcoholic beverages or as a    fermentation product of glucose; glucose is present in this specimen.    Interpret result with caution, as the presence of ethyl alcohol is    likely due, at least in part, to fermentation of glucose.  Drug Absent but Declared for Prescription Verification   Acetaminophen  Not Detected UNEXPECTED    Acetaminophen, as indicated in the declared medication list, is not    always detected even when used as directed.    Salicylate                     Not Detected UNEXPECTED    Aspirin, as indicated in the declared medication list, is not always    detected even when used as directed.     Ibuprofen                      Not Detected UNEXPECTED    Ibuprofen, as indicated in the declared medication list, is not    always detected even when used as directed.  ==================================================================== Test                      Result    Flag   Units      Ref Range   Creatinine              39               mg/dL      >=09 ==================================================================== Declared Medications:  The flagging and interpretation on this report are based on the  following declared medications.  Unexpected results may arise from  inaccuracies in the declared medications.   **Note: The testing scope of this panel does not include small to  moderate amounts of these reported medications:   Acetaminophen (Tylenol)  Aspirin  Ibuprofen   **Note: The testing scope of this panel does not include the  following reported medications:   Betamethasone  Canagliflozin (Invokana)  Cetirizine (Zyrtec)  Dorzolamide  Fluticasone  Latanoprost (Xalatan)  Linagliptin (Tradjenta)  Lisinopril (Zestril)  Metformin  Rosuvastatin (Crestor)  Semaglutide (Rybelsus)  Sitagliptin (Januvia)  Tamsulosin (Flomax)  Timolol (Timoptic) ==================================================================== For clinical consultation, please call [phone]. ====================================================================       ROS  Constitutional: Denies any fever or chills Gastrointestinal: No reported hemesis, hematochezia, vomiting, or acute GI distress Musculoskeletal: Denies any acute onset joint swelling, redness, loss of ROM, or weakness Neurological: No reported episodes of acute onset apraxia, aphasia, dysarthria, agnosia, amnesia, paralysis, loss of coordination, or loss of consciousness  Medication Review  Semaglutide, TRUEplus Lancets 28G, Vitamin B-12, acetaminophen, aspirin EC, canagliflozin, cetirizine, dorzolamide-timolol,  fluticasone, glucose blood, ibuprofen, latanoprost, linagliptin, lisinopril, meclizine, metFORMIN, rosuvastatin, sitaGLIPtin, tamsulosin, and timolol  History Review  Allergy: [name] is allergic to zolpidem. Drug: [name]  reports no history of drug use. Alcohol:  reports no history of alcohol use. Tobacco:  reports that he has quit smoking. He has never used smokeless tobacco. Social: Mr. Villarruel  reports that he has quit smoking. He has never used smokeless tobacco. He reports that he does not drink alcohol and does not use drugs. Medical:  has a past medical history of Arthritis, Diabetes (HCC), Glaucoma, Heart attack (HCC), HLD (hyperlipidemia), HTN (hypertension), Lower back pain, Sleep apnea, and Tinnitus. Surgical: Mr. Wery  has a past surgical history that includes Coronary angioplasty with stent; Colonoscopy with propofol (N/A, 12/10/2014); and Cataract extraction w/PHACO (Right, 11/16/2015). Family: family history includes Leukemia in his son.  Laboratory Chemistry Profile   Renal Lab Results  Component Value Date   BUN 9 01/26/2023   CREATININE 0.65 01/26/2023   GFRNONAA >60 01/26/2023    Hepatic Lab Results  Component Value Date   AST 16 04/09/2022   ALT 12 04/09/2022  ALBUMIN 4.4 04/09/2022   ALKPHOS 57 04/09/2022    Electrolytes Lab Results  Component Value Date   NA 137 01/26/2023   K 3.8 01/26/2023   CL 101 01/26/2023   CALCIUM 9.1 01/26/2023   MG 2.1 04/09/2022    Bone Lab Results  Component Value Date   25OHVITD1 6.8 (L) 04/09/2022   25OHVITD2 <1.0 04/09/2022   25OHVITD3 6.8 04/09/2022    Inflammation (CRP: Acute Phase) (ESR: Chronic Phase) Lab Results  Component Value Date   CRP 0.5 04/09/2022   ESRSEDRATE 4 04/09/2022         Note: Above Lab results reviewed.  Recent Imaging Review  MR BRAIN WO CONTRAST CLINICAL DATA:  83 year old male with dizziness, vertigo.  EXAM: MRI HEAD WITHOUT CONTRAST  TECHNIQUE: Multiplanar,  multiecho pulse sequences of the brain and surrounding structures were obtained without intravenous contrast.  COMPARISON:  CT head, CTA head and neck earlier today.  FINDINGS: Brain: No restricted diffusion to suggest acute infarction. No midline shift, mass effect, evidence of mass lesion, ventriculomegaly, extra-axial collection or acute intracranial hemorrhage. Cervicomedullary junction and pituitary are within normal limits.  Widespread periventricular white matter T2 and FLAIR hyperintensity, moderate for age with scattered other small central and occasional subcortical white matter T2 and FLAIR hyperintensity. No cortical encephalomalacia identified. No chronic cerebral blood products. Deep gray matter nuclei, brainstem and cerebellum appear normal for age.  Vascular: Major intracranial vascular flow voids are preserved.  Skull and upper cervical spine: Negative for age visible cervical spine. Visualized bone marrow signal is within normal limits.  Sinuses/Orbits: Postoperative changes to the right globe, otherwise negative.  Other: Mastoids are clear. Visible internal auditory structures appear normal. Negative visible scalp and face.  IMPRESSION: 1. No acute intracranial abnormality. 2. Moderate for age white matter signal changes most commonly due to chronic small vessel disease.  Electronically Signed   By: Odessa Fleming M.D.   On: 01/26/2023 11:51 CT Angio Head Neck W WO CM CLINICAL DATA:  83 year old male with dizziness, vertigo.  EXAM: CT ANGIOGRAPHY HEAD AND NECK WITH AND WITHOUT CONTRAST  TECHNIQUE: Multidetector CT imaging of the head and neck was performed using the standard protocol during bolus administration of intravenous contrast. Multiplanar CT image reconstructions and MIPs were obtained to evaluate the vascular anatomy. Carotid stenosis measurements (when applicable) are obtained utilizing NASCET criteria, using the distal internal carotid  diameter as the denominator.  RADIATION DOSE REDUCTION: This exam was performed according to the departmental dose-optimization program which includes automated exposure control, adjustment of the mA and/or kV according to patient size and/or use of iterative reconstruction technique.  CONTRAST:  75mL OMNIPAQUE IOHEXOL 350 MG/ML SOLN  COMPARISON:  Head CT 03/02/2022.  FINDINGS: CT HEAD  Brain: Cerebral volume is stable and probably normal for age. No midline shift, ventriculomegaly, mass effect, evidence of mass lesion, intracranial hemorrhage or evidence of cortically based acute infarction. Mild or at most moderate for age patchy periventricular white matter hypodensity most pronounced near the atria is stable. Normal gray-white differentiation otherwise.  Calvarium and skull base: No acute osseous abnormality identified.  Paranasal sinuses: Tympanic cavities, Visualized paranasal sinuses and mastoids are stable and well aerated.  Orbits: No acute orbit or scalp soft tissue finding.  CTA NECK  Skeleton: Mandible motion artifact. Absent dentition. Cervical spine degeneration, bulky anteriorly at C2-C3. No acute osseous abnormality identified.  Upper chest: Mild atelectasis.  Other neck: Partially retropharyngeal course of both ICAs, normal variant.  Aortic arch: Calcified aortic atherosclerosis.  3 vessel arch.  Right carotid system: Brachiocephalic and right CCA origin atherosclerosis. No significant stenosis. Retropharyngeal right carotid bifurcation and mild right ICA bulb calcified plaque without stenosis. Tortuous right ICA with additional mild calcification just below the skull base.  Left carotid system: Similar tortuosity and scattered calcified atherosclerosis. Up to 50% stenosis at the left ICA bulb. Tortuosity below the skull base.  Vertebral arteries: Tortuous right subclavian artery origin with a kinked appearance. Mild calcified atherosclerosis  there. Normal right vertebral artery origin. Right vertebral artery is patent to the skull base with no significant plaque or stenosis.  Proximal left subclavian artery soft and calcified plaque without stenosis. Calcified plaque near the left vertebral artery origin with no stenosis. Mildly dominant left vertebral artery is patent and within normal limits to the skull base.  CTA HEAD  Posterior circulation: Dominant left V4 segment. Distal vertebral arteries and vertebrobasilar junction are patent without stenosis. Normal right PICA origin. Left AICA appears dominant. Patent mildly tortuous basilar artery without stenosis. Fetal type PCA origins. Patent SCA origins. Bilateral PCA branches are within normal limits.  Anterior circulation: Both ICA siphons are patent. Moderate left siphon calcified atherosclerosis especially at the cavernous segment. Up to moderate stenosis results. Mild left supraclinoid stenosis. Normal left posterior communicating artery origin. Less pronounced contralateral right ICA siphon calcified plaque. Mild if any right siphon stenosis. Normal right posterior communicating artery origin. Patent carotid termini. Normal MCA and ACA origins. Tortuous A1 segments, the left is mildly dominant. Normal anterior communicating artery. Bilateral ACA branches are within normal limits. Left MCA M1 segment is tortuous, bifurcates without stenosis. Right MCA M1 segment is mildly tortuous. Right MCA bifurcation is patent without stenosis. Bilateral MCA branches are within normal limits.  Venous sinuses: Early contrast timing, not well evaluated.  Anatomic variants: Mildly dominant left vertebral artery, left ACA A1.  Review of the MIP images confirms the above findings  IMPRESSION: 1. Negative for large vessel occlusion.  2. Positive for carotid calcified atherosclerosis. Up to 50% stenosis at the Left ICA bulb, and Moderate stenosis of the Left ICA siphon. No  other hemodynamically significant arterial stenosis identified.  3. No acute intracranial abnormality by CT. Stable chronic white matter changes.  4.  Aortic Atherosclerosis (ICD10-I70.0).  Electronically Signed   By: Odessa Fleming M.D.   On: 01/26/2023 10:42 MR LUMBAR SPINE WO CONTRAST CLINICAL DATA:  Chronic low back pain radiating into both legs, worse over the past 3 months.  EXAM: MRI LUMBAR SPINE WITHOUT CONTRAST  TECHNIQUE: Multiplanar, multisequence MR imaging of the lumbar spine was performed. No intravenous contrast was administered.  COMPARISON:  Plain films lumbar spine 04/09/2022.  FINDINGS: Segmentation:  Standard.  Alignment:  Mild convex left curvature is unchanged.  No listhesis.  Vertebrae:  No fracture, evidence of discitis, or bone lesion.  Conus medullaris and cauda equina: Conus extends to the L1 level. Conus and cauda equina appear normal.  Paraspinal and other soft tissues: Negative.  Disc levels:  T11-12 is imaged in the sagittal plane only and negative.  T12-L1: A right lateral recess and foraminal protrusion causes mild to moderate narrowing in the lateral recess and foramen. The left foramen is open.  L1-2: Shallow disc bulge with endplate spurring. Mild narrowing in the right lateral recess and foramen. The left foramen is open.  L2-3: There is loss of disc space height with a broad-based bulge and endplate spur. Ligamentum flavum thickening is present. Moderately severe central canal stenosis is seen. The foramina  are open.  L3-4: Loss of disc space height with a shallow bulge and endplate spur. There is mild-to-moderate bilateral facet degenerative change. Mild central canal and mild to moderate right foraminal narrowing are seen. The left foramen is open.  L4-5: There is a disc bulge with endplate spur, ligamentum flavum thickening and mild facet arthropathy. There is mild to moderate central canal and left foraminal stenosis. The  right foramen is open.  L5-S1: Loss of disc space height with a shallow bulge and endplate spur. Moderate left foraminal stenosis is present. The central canal and right foramen are open.  IMPRESSION: 1. Multilevel spondylosis appears worst at L2-3 where there is moderately severe central canal stenosis. 2. Mild to moderate right foraminal narrowing at T12-L1 and L3-4. 3. Mild right lateral recess and foraminal narrowing at L1-2. 4. Mild to moderate central canal and left foraminal stenosis at L4-5. 5. Moderate left foraminal stenosis L5-S1.  Electronically Signed   By: Drusilla Kanner M.D.   On: 01/26/2023 10:36 Note: Reviewed        Physical Exam  General appearance: Well nourished, well developed, and well hydrated. In no apparent acute distress Mental status: Alert, oriented x 3 (person, place, & time)       Respiratory: No evidence of acute respiratory distress Eyes: PERLA Vitals: BP (!) 90/55 (Patient Position: Sitting, Cuff Size: Normal)   Pulse 83   Temp (!) 97.5 F (36.4 C) (Temporal)   Resp 18   Ht 5\' 9"  (1.753 m)   Wt 180 lb (81.6 kg)   SpO2 98%   BMI 26.58 kg/m  BMI: Estimated body mass index is 26.58 kg/m as calculated from the following:   Height as of this encounter: 5\' 9"  (1.753 m).   Weight as of this encounter: 180 lb (81.6 kg). Ideal: Ideal body weight: 70.7 kg (155 lb 13.8 oz) Adjusted ideal body weight: 75.1 kg (165 lb 8.3 oz)  Assessment   Diagnosis Status  1. Chronic knee pain (1ry area of Pain) (Bilateral) (R>L)   2. Chronic lower extremity pain (Bilateral) (R>L)   3. 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   4. Chronic painful diabetic neuropathy (HCC)   5. Complaints of weakness of lower extremity   6. Generalized osteoarthritis of multiple sites   7. Abnormal MRI, knee (08/12/2022) (Right)   8. Abnormal MRI, lumbar spine (01/26/2023)   9. 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 Osteoarthritis of  knee (Right)   11. 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 Symptoms suggest relief from stenosis-related pressure when sitting. Consider surgical intervention if symptoms persist or worsen. Schedule an epidural injection at L2-3 to alleviate symptoms.  Right knee pain   Pain in the medial aspect of the right knee, previously relieved by injection, indicates a positive response to treatment. This pain contributes to an altered gait, described as a wobble. No anticoagulant therapy is present, allowing for another injection. Administer a right knee injection in the medial aspect to alleviate pain.  Peripheral neuropathy   Burning sensations in the feet  were previously alleviated by [place] treatment, indicating effective management. Plan to repeat treatment if symptoms return.       [name] has a current medication list which includes the following long-term medication(s): cetirizine, vitamin b-12, fluticasone, linagliptin, lisinopril, metformin, rosuvastatin, and sitagliptin.  Pharmacotherapy (Medications Ordered): No orders of the defined types were placed in this encounter.  Orders:  Orders Placed This Encounter  Procedures   Lumbar Epidural Injection    Standing Status:   Future    Expiration Date:   07/30/2023    Scheduling Instructions:     Procedure: Interlaminar Lumbar Epidural Steroid injection (LESI)  L2-3     Laterality: Right-sided     Sedation: With Sedation.     Timeframe: ASAP    Where will this procedure be performed?:    [facility]   KNEE INJECTION    Local Anesthetic & Steroid injection.    Standing Status:   Future    Expiration Date:   07/30/2023    Scheduling Instructions:     Side: Right-sided     Sedation: None     Timeframe: ASAP    Where will this procedure be performed?:   [facility]   Follow-up plan:   Return for [facility]): (R) L2-3 LESI + (R) IA Knee inj..      Interventional Therapies  Risk Factors  Considerations:  Language barrier: [place] (interpreter required)  IDDM  CAD  Hx. MI  (B) Carotid A. Stenosis  HTN  AoV stenosis  Mitral & Tricuspid V. insufficiency  OSA   Planned  Pending:  Diagnostic/therapeutic right L2-3 LESI #1  Therapeutic right IA steroid knee injection (medial approach) #2    Under consideration:      Completed:   Therapeutic left pes anserine bursa steroid knee inj. #1  Therapeutic bilateral IA Z[place] knee injection x1 (11/06/2022) (100/100/75/75) Therapeutic bilateral lower extremity Q[place] Tx x1 (11/06/2022) (50/50/75/75) Diagnostic/therapeutic bilateral IA steroid knee inj. x1 (05/15/2022) (9-0/10) (100/100/95-100)  (10/10/2022) Referral to orthopedic surgery   Completed by other providers:      Therapeutic  Palliative (PRN) options:   None established     Recent Visits No visits were found meeting these conditions. Showing recent visits within past 90 days and meeting all other requirements Today's Visits Date Type Provider Dept  04/29/23 Office Visit Delano Metz, MD [facility]  Showing today's visits and meeting all other requirements Future Appointments Date Type Provider Dept  05/07/23 Appointment Delano Metz, MD [facility]  Showing future appointments within next 90 days and meeting all other requirements  I discussed the assessment and treatment plan with the patient. The patient was provided an opportunity to ask questions and all were answered. The patient agreed with  the plan and demonstrated an understanding of the instructions.  Patient advised to call back or seek an in-person evaluation if the symptoms or condition worsens.  Duration of encounter: 30 minutes.  Total time on encounter, as per AMA guidelines included both the face-to-face and non-face-to-face time personally spent by the physician and/or other qualified health care professional(s) on the day of the encounter (includes time in activities that require the physician or other qualified health care professional and does not include time in activities normally performed by clinical staff). Physician's time may include the following activities when performed: Preparing to see the patient (e.g., pre-charting review of records, searching for previously ordered imaging, lab work, and nerve conduction tests) Review of prior analgesic pharmacotherapies. Reviewing  PMP Interpreting ordered tests (e.g., lab work, imaging, nerve conduction tests) Performing post-procedure evaluations, including interpretation of diagnostic procedures Obtaining and/or reviewing separately obtained history Performing a medically appropriate examination and/or evaluation Counseling and educating the patient/family/caregiver Ordering medications, tests, or procedures Referring and communicating with other health care professionals (when not separately reported) Documenting clinical information in the electronic or other health record Independently interpreting results (not separately reported) and communicating results to the patient/ family/caregiver Care coordination (not separately reported)  Note by: Adrian Done, MD Date: 04/29/2023; Time: 10:52 AM

## 2023-04-29 ENCOUNTER — Encounter: Payer: Self-pay | Admitting: Pain Medicine

## 2023-04-29 ENCOUNTER — Ambulatory Visit: Attending: Pain Medicine | Admitting: Pain Medicine

## 2023-04-29 VITALS — BP 90/55 | HR 83 | Temp 97.5°F | Resp 18 | Ht 69.0 in | Wt 180.0 lb

## 2023-04-29 DIAGNOSIS — M48062 Spinal stenosis, lumbar region with neurogenic claudication: Secondary | ICD-10-CM

## 2023-04-29 DIAGNOSIS — M1711 Unilateral primary osteoarthritis, right knee: Secondary | ICD-10-CM | POA: Diagnosis present

## 2023-04-29 DIAGNOSIS — E1140 Type 2 diabetes mellitus with diabetic neuropathy, unspecified: Secondary | ICD-10-CM | POA: Diagnosis present

## 2023-04-29 DIAGNOSIS — G8929 Other chronic pain: Secondary | ICD-10-CM | POA: Diagnosis present

## 2023-04-29 DIAGNOSIS — M545 Low back pain, unspecified: Secondary | ICD-10-CM | POA: Diagnosis present

## 2023-04-29 DIAGNOSIS — R937 Abnormal findings on diagnostic imaging of other parts of musculoskeletal system: Secondary | ICD-10-CM | POA: Diagnosis present

## 2023-04-29 DIAGNOSIS — R936 Abnormal findings on diagnostic imaging of limbs: Secondary | ICD-10-CM | POA: Diagnosis present

## 2023-04-29 DIAGNOSIS — M79605 Pain in left leg: Secondary | ICD-10-CM | POA: Diagnosis present

## 2023-04-29 DIAGNOSIS — M79604 Pain in right leg: Secondary | ICD-10-CM | POA: Insufficient documentation

## 2023-04-29 DIAGNOSIS — M159 Polyosteoarthritis, unspecified: Secondary | ICD-10-CM

## 2023-04-29 DIAGNOSIS — M25561 Pain in right knee: Secondary | ICD-10-CM | POA: Insufficient documentation

## 2023-04-29 DIAGNOSIS — R29898 Other symptoms and signs involving the musculoskeletal system: Secondary | ICD-10-CM | POA: Diagnosis present

## 2023-04-29 DIAGNOSIS — M25562 Pain in left knee: Secondary | ICD-10-CM | POA: Insufficient documentation

## 2023-04-29 NOTE — Patient Instructions (Signed)

## 2023-04-29 NOTE — Progress Notes (Signed)
 Safety precautions to be maintained throughout the outpatient stay will include: orient to surroundings, keep bed in low position, maintain call bell within reach at all times, provide assistance with transfer out of bed and ambulation.

## 2023-05-06 NOTE — Progress Notes (Unsigned)
 PROVIDER NOTE: Interpretation of information contained herein should be left to medically-trained personnel. Specific patient instructions are provided elsewhere under "Patient Instructions" section of medical record. This document was created in part using STT-dictation technology, any transcriptional errors that may result from this process are unintentional.  Patient: Adrian Kim Type: Established DOB: 1940-10-24 MRN: [id number] [facility]CP: [facility]  Service: Procedure DOS: 05/07/2023 Setting: Ambulatory Location: Ambulatory outpatient facility Delivery: Face-to-face Provider: Oswaldo Done, MD Specialty: Interventional Pain Management Specialty designation: 09 Location: Outpatient facility Ref. Prov.: [facility]       Interventional Therapy   Type: Lumbar epidural steroid injection (LESI) (interlaminar) #1    Laterality: Right   Level:  L2-3 Level.  Imaging: Fluoroscopic guidance Spinal ([id number]) Anesthesia: Local anesthesia (1-2% Lidocaine) Anxiolysis: IV Versed         Sedation:                         DOS: 05/07/2023  Performed by: Oswaldo Done, MD  Purpose: Diagnostic/Therapeutic Indications: Lumbar radicular pain of intraspinal etiology of more than 4 weeks that has failed to respond to conservative therapy and is severe enough to impact quality of life or function. 1. Baker's cyst of knee (Right)   2. Chronic knee pain (Right)   3. Chronic knee pain (1ry area of Pain) (Bilateral) (R>L)   4. 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 Chronic lower extremity pain (Bilateral) (R>L)   6. Complaints of weakness of lower extremity   7. Complex tear of lateral meniscus of knee, sequela (Right)   8. Complex tear of medial meniscus of knee, sequela (Right)   9. Degeneration of intervertebral disc of lumbosacral region with discogenic back pain and lower extremity pain   10. Effusion of knee joint (Right)    11. Osteoarthritis of knee (Right)   12. 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 Transient synovitis of knee (Right)   15. Tricompartment osteoarthritis of knee (Right)    NAS-11 Pain score:   Pre-procedure:  /10   Post-procedure:  /10      Position / Prep / Materials:  Position: Prone w/ head of the table raised (slight reverse trendelenburg) to facilitate breathing.  Prep solution: ChloraPrep (2% chlorhexidine gluconate and 70% isopropyl alcohol) Prep Area: Entire Posterior Lumbar Region from lower scapular tip down to mid buttocks area and from flank to flank. Materials:  Tray: Epidural tray Needle(s):  Type: Epidural needle (Tuohy) Gauge (G):  17 Length: Regular (3.5-in) Qty: 1   H&P (Pre-op Assessment):  Adrian Kim is a 83 y.o. (year old), male patient, seen today for interventional treatment. He  has a past surgical history that includes Coronary angioplasty with stent; Colonoscopy with propofol (N/A, 12/10/2014); and Cataract extraction w/PHACO (Right, 11/16/2015). Adrian Kim has a current medication list which includes the following prescription(s): acetaminophen, aspirin ec, cetirizine, vitamin b-12, dorzolamide-timolol, fluticasone, glucose blood, ibuprofen, invokana, latanoprost, linagliptin, lisinopril, meclizine, metformin, rosuvastatin, rybelsus, sitagliptin, tamsulosin, timolol, and trueplus lancets 28g. His primarily concern today is the No chief complaint on file.  Initial Vital Signs:  Pulse/HCG Rate:    Temp:   Resp:   BP:   SpO2:    BMI: Estimated body mass index is 26.58 kg/m as calculated from the following:   Height as of 04/29/23: 5\' 9"  (1.753 m).   Weight as of 04/29/23:  180 lb (81.6 kg).  Risk Assessment: Allergies: Reviewed. He is allergic to zolpidem.  Allergy Precautions: None required Coagulopathies: Reviewed. None identified.  Blood-thinner therapy: None at  this time Active Infection(s): Reviewed. None identified. Adrian Kim is afebrile  Site Confirmation: Adrian Kim was asked to confirm the procedure and laterality before marking the site Procedure checklist: Completed Consent: Before the procedure and under the influence of no sedative(s), amnesic(s), or anxiolytics, the patient was informed of the treatment options, risks and possible complications. To fulfill our ethical and legal obligations, as recommended by the American Medical Association's Code of Ethics, I have informed the patient of my clinical impression; the nature and purpose of the treatment or procedure; the risks, benefits, and possible complications of the intervention; the alternatives, including doing nothing; the risk(s) and benefit(s) of the alternative treatment(s) or procedure(s); and the risk(s) and benefit(s) of doing nothing. The patient was provided information about the general risks and possible complications associated with the procedure. These may include, but are not limited to: failure to achieve desired goals, infection, bleeding, organ or nerve damage, allergic reactions, paralysis, and death. In addition, the patient was informed of those risks and complications associated to Spine-related procedures, such as failure to decrease pain; infection (i.e.: Meningitis, epidural or intraspinal abscess); bleeding (i.e.: epidural hematoma, subarachnoid hemorrhage, or any other type of intraspinal or peri-dural bleeding); organ or nerve damage (i.e.: Any type of peripheral nerve, nerve root, or spinal cord injury) with subsequent damage to sensory, motor, and/or autonomic systems, resulting in permanent pain, numbness, and/or weakness of one or several areas of the body; allergic reactions; (i.e.: anaphylactic reaction); and/or death. Furthermore, the patient was informed of those risks and complications associated with the medications. These include, but are not limited to:  allergic reactions (i.e.: anaphylactic or anaphylactoid reaction(s)); adrenal axis suppression; blood sugar elevation that in diabetics may result in ketoacidosis or comma; water retention that in patients with history of congestive heart failure may result in shortness of breath, pulmonary edema, and decompensation with resultant heart failure; weight gain; swelling or edema; medication-induced neural toxicity; particulate matter embolism and blood vessel occlusion with resultant organ, and/or nervous system infarction; and/or aseptic necrosis of one or more joints. Finally, the patient was informed that Medicine is not an exact science; therefore, there is also the possibility of unforeseen or unpredictable risks and/or possible complications that may result in a catastrophic outcome. The patient indicated having understood very clearly. We have given the patient no guarantees and we have made no promises. Enough time was given to the patient to ask questions, all of which were answered to the patient's satisfaction. Mr. Enyeart has indicated that he wanted to continue with the procedure. Attestation: I, the ordering provider, attest that I have discussed with the patient the benefits, risks, side-effects, alternatives, likelihood of achieving goals, and potential problems during recovery for the procedure that I have provided informed consent. Date  Time: {CHL [facility] TIME CHOICES:[id number]}   Pre-Procedure Preparation:  Monitoring: As per clinic protocol. Respiration, ETCO2, SpO2, BP, heart rate and rhythm monitor placed and checked for adequate function Safety Precautions: Patient was assessed for positional comfort and pressure points before starting the procedure. Time-out: I initiated and conducted the "Time-out" before starting the procedure, as per protocol. The patient was asked to participate by confirming the accuracy of the "Time Out" information. Verification of the correct person, site,  and procedure were performed and confirmed by me, the nursing staff, and the patient. "Time-out"  conducted as per Joint Commission's Universal Protocol (UP.01.01.01). Time:   Start Time:   hrs.  Description/Narrative of Procedure:          Target: Epidural space via interlaminar opening, initially targeting the lower laminar border of the superior vertebral body. Region: Lumbar Approach: Percutaneous paravertebral  Rationale (medical necessity): procedure needed and proper for the diagnosis and/or treatment of the patient's medical symptoms and needs. Procedural Technique Safety Precautions: Aspiration looking for blood return was conducted prior to all injections. At no point did we inject any substances, as a needle was being advanced. No attempts were made at seeking any paresthesias. Safe injection practices and needle disposal techniques used. Medications properly checked for expiration dates. SDV (single dose vial) medications used. Description of the Procedure: Protocol guidelines were followed. The procedure needle was introduced through the skin, ipsilateral to the reported pain, and advanced to the target area. Bone was contacted and the needle walked caudad, until the lamina was cleared. The epidural space was identified using "loss-of-resistance technique" with 2-3 ml of PF-NaCl (0.9% NSS), in a 5cc LOR glass syringe.  There were no vitals filed for this visit.  Start Time:   hrs. End Time:   hrs.  Imaging Guidance (Spinal):          Type of Imaging Technique: Fluoroscopy Guidance (Spinal) Indication(s): Fluoroscopy guidance for needle placement to enhance accuracy in procedures requiring precise needle localization for targeted delivery of medication in or near specific anatomical locations not easily accessible without such real-time imaging assistance. Exposure Time: Please see nurses notes. Contrast: Before injecting any contrast, we confirmed that the patient did not have an  allergy to iodine, shellfish, or radiological contrast. Once satisfactory needle placement was completed at the desired level, radiological contrast was injected. Contrast injected under live fluoroscopy. No contrast complications. See chart for type and volume of contrast used. Fluoroscopic Guidance: I was personally present during the use of fluoroscopy. "Tunnel Vision Technique" used to obtain the best possible view of the target area. Parallax error corrected before commencing the procedure. "Direction-depth-direction" technique used to introduce the needle under continuous pulsed fluoroscopy. Once target was reached, antero-posterior, oblique, and lateral fluoroscopic projection used confirm needle placement in all planes. Images permanently stored in EMR. Interpretation: I personally interpreted the imaging intraoperatively. Adequate needle placement confirmed in multiple planes. Appropriate spread of contrast into desired area was observed. No evidence of afferent or efferent intravascular uptake. No intrathecal or subarachnoid spread observed. Permanent images saved into the patient's record.  Antibiotic Prophylaxis:   Anti-infectives (From admission, onward)    None      Indication(s): None identified  Post-operative Assessment:  Post-procedure Vital Signs:  Pulse/HCG Rate:    Temp:   Resp:   BP:   SpO2:    EBL: None  Complications: No immediate post-treatment complications observed by team, or reported by patient.  Note: The patient tolerated the entire procedure well. A repeat set of vitals were taken after the procedure and the patient was kept under observation following institutional policy, for this type of procedure. Post-procedural neurological assessment was performed, showing return to baseline, prior to discharge. The patient was provided with post-procedure discharge instructions, including a section on how to identify potential problems. Should any problems arise  concerning this procedure, the patient was given instructions to immediately contact us, at any time, without hesitation. In any case, we plan to contact the patient by telephone for a follow-up status report regarding this interventional procedure.  Comments:  No additional relevant information.  Plan of Care (POC)  Orders:  No orders of the defined types were placed in this encounter.  Chronic Opioid Analgesic:   No chronic opioid analgesics therapy prescribed by our practice. None MME/day: 0 mg/day   Medications ordered for procedure: No orders of the defined types were placed in this encounter.  Medications administered: [name] had no medications administered during this visit.  See the medical record for exact dosing, route, and time of administration.  Follow-up plan:   No follow-ups on file.       Interventional Therapies  Risk Factors  Considerations:  Language barrier: [place] (interpreter required)  IDDM  CAD  Hx. MI  (B) Carotid A. Stenosis  HTN  AoV stenosis  Mitral & Tricuspid V. insufficiency  OSA   Planned  Pending:  Diagnostic/therapeutic right L2-3 LESI #1  Therapeutic right IA steroid knee injection (medial approach) #2    Under consideration:      Completed:   Therapeutic left pes anserine bursa steroid knee inj. #1  Therapeutic bilateral IA Zilretta knee injection x1 (11/06/2022) (100/100/75/75) Therapeutic bilateral lower extremity Qutenza Tx x1 (11/06/2022) (50/50/75/75) Diagnostic/therapeutic bilateral IA steroid knee inj. x1 (05/15/2022) (9-0/10) (100/100/95-100)  (10/10/2022) Referral to orthopedic surgery   Completed by other providers:      Therapeutic  Palliative (PRN) options:   None established     Recent Visits Date Type Provider Dept  04/29/23 Office Visit Delano Metz, MD [facility]  Showing recent visits within past 90 days and meeting all other requirements Future Appointments Date Type Provider  Dept  05/07/23 Appointment Delano Metz, MD [facility]  Showing future appointments within next 90 days and meeting all other requirements  Disposition: Discharge home  Discharge (Date  Time): 05/07/2023;   hrs.   Primary Care Physician: [facility] Location: [facility] Note by: Oswaldo Done, MD (TTS technology used. I apologize for any typographical errors that were not detected and corrected.) Date: 05/07/2023; Time: 7:22 AM  Disclaimer:  Medicine is not an Visual merchandiser. The only guarantee in medicine is that nothing is guaranteed. It is important to note that the decision to proceed with this intervention was based on the information collected from the patient. The Data and conclusions were drawn from the patient's questionnaire, the interview, and the physical examination. Because the information was provided in large part by the patient, it cannot be guaranteed that it has not been purposely or unconsciously manipulated. Every effort has been made to obtain as much relevant data as possible for this evaluation. It is important to note that the conclusions that lead to this procedure are derived in large part from the available data. Always take into account that the treatment will also be dependent on availability of resources and existing treatment guidelines, considered by other Pain Management Practitioners as being common knowledge and practice, at the time of the intervention. For Medico-Legal purposes, it is also important to point out that variation in procedural techniques and pharmacological choices are the acceptable norm. The indications, contraindications, technique, and results of the above procedure should only be interpreted and judged by a Board-Certified Interventional Pain Specialist with extensive familiarity and expertise in the same exact procedure and technique.    Type:  Steroid Intra-articular  Knee Injection          Laterality: Right (-RT) Level/approach: Lateral Imaging guidance: None required (CPT-20610) Anesthesia: Local anesthesia (1-2% Lidocaine) Anxiolysis: IV  Versed         Sedation:                         DOS: 05/07/2023  Performed by: Oswaldo Done, MD  Purpose: Diagnostic/Therapeutic Indications: Knee arthralgia associated to osteoarthritis of the knee 1. Baker's cyst of knee (Right)   2. Chronic knee pain (Right)   3. Chronic knee pain (1ry area of Pain) (Bilateral) (R>L)   4. 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 Chronic lower extremity pain (Bilateral) (R>L)   6. Complaints of weakness of lower extremity   7. Complex tear of lateral meniscus of knee, sequela (Right)   8. Complex tear of medial meniscus of knee, sequela (Right)   9. Degeneration of intervertebral disc of lumbosacral region with discogenic back pain and lower extremity pain   10. Effusion of knee joint (Right)   11. Osteoarthritis of knee (Right)   12. 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 Transient synovitis of knee (Right)   15. Tricompartment osteoarthritis of knee (Right)    NAS-11 score:   Pre-procedure:  /10   Post-procedure:  /10     Pre-Procedure Preparation  Monitoring: As per clinic protocol.  Risk Assessment: Vitals:  EAV:[id number] body mass index is 26.58 kg/m as calculated from the following:   Height as of 04/29/23: 5\' 9"  (1.753 m).   Weight as of 04/29/23: 180 lb (81.6 kg)., Rate:  , BP: , Resp: , Temp: , SpO2:   Allergies: He is allergic to zolpidem.  Precautions: No additional precautions required  Blood-thinner(s): None at this time  Coagulopathies: Reviewed. None identified.   Active Infection(s): Reviewed. None identified. Mr. Brenneman is afebrile   Location setting: Exam room Position: Sitting w/ knee bent 90 degrees Safety Precautions: Patient was  assessed for positional comfort and pressure points before starting the procedure. Prepping solution: DuraPrep (Iodine Povacrylex [0.7% available iodine] and Isopropyl Alcohol, 74% w/w) Prep Area: Entire knee region Approach: percutaneous, just above the tibial plateau, lateral to the infrapatellar tendon. Intended target: Intra-articular knee space Materials: Tray: Block Needle(s): Regular Qty: 1/side Length: 1.5-inch Gauge: 25G (x1) + 22G (x1)  No orders of the defined types were placed in this encounter.   No orders of the defined types were placed in this encounter.    Time-out:   I initiated and conducted the "Time-out" before starting the procedure, as per protocol. The patient was asked to participate by confirming the accuracy of the "Time Out" information. Verification of the correct person, site, and procedure were performed and confirmed by me, the nursing staff, and the patient. "Time-out" conducted as per Joint Commission's Universal Protocol (UP.01.01.01). Procedure checklist: Completed   Description of procedure  Start Time:   hrs  Local Anesthesia: Once the patient was positioned, prepped, and time-out was completed. The target area was identified located. The skin was marked with an approved surgical skin marker. Once marked, the skin (epidermis, dermis, and hypodermis), and deeper tissues (fat, connective tissue and muscle) were infiltrated with a small amount of a short-acting local anesthetic, loaded on a 10cc syringe with a 25G, 1.5-in  Needle. An appropriate amount of time was allowed for local anesthetics to take effect before proceeding to the next step. Local Anesthetic: Lidocaine 1-2% The unused portion of the local anesthetic was discarded in the proper designated containers. Safety Precautions: Aspiration looking  for blood return was conducted prior to all injections. At no point did I inject any substances, as a needle was being advanced. Before injecting, the  patient was told to immediately notify me if he was experiencing any new onset of "ringing in the ears, or metallic taste in the mouth". No attempts were made at seeking any paresthesias. Safe injection practices and needle disposal techniques used. Medications properly checked for expiration dates. SDV (single dose vial) medications used. After the completion of the procedure, all disposable equipment used was discarded in the proper designated medical waste containers.  Technical description: Protocol guidelines were followed. After positioning, the target area was identified and prepped in the usual manner. Skin & deeper tissues infiltrated with local anesthetic. Appropriate amount of time allowed to pass for local anesthetics to take effect. Proper needle placement secured. Once satisfactory needle placement was confirmed, I proceeded to inject the desired solution in slow, incremental fashion, intermittently assessing for discomfort or any signs of abnormal or undesired spread of substance. Once completed, the needle was removed and disposed of, as per hospital protocols. The area was cleaned, making sure to leave some of the prepping solution back to take advantage of its long term bactericidal properties.  Aspiration:  Negative        There were no vitals filed for this visit.  End Time:   hrs  Imaging guidance  Imaging-assisted Technique: None required. Indication(s): N/A Exposure Time: N/A Contrast: None Fluoroscopic Guidance: N/A Ultrasound Guidance: N/A Interpretation: N/A

## 2023-05-07 ENCOUNTER — Ambulatory Visit: Attending: Pain Medicine | Admitting: Pain Medicine

## 2023-05-07 ENCOUNTER — Encounter: Payer: Self-pay | Admitting: Pain Medicine

## 2023-05-07 ENCOUNTER — Ambulatory Visit
Admission: RE | Admit: 2023-05-07 | Discharge: 2023-05-07 | Disposition: A | Discharge status: Home / Self Care | Source: Ambulatory Visit | Attending: Pain Medicine | Admitting: Pain Medicine

## 2023-05-07 VITALS — BP 117/66 | HR 81 | Temp 97.1°F | Resp 16 | Ht 68.0 in | Wt 180.0 lb

## 2023-05-07 DIAGNOSIS — E119 Type 2 diabetes mellitus without complications: Secondary | ICD-10-CM | POA: Insufficient documentation

## 2023-05-07 DIAGNOSIS — M51372 Other intervertebral disc degeneration, lumbosacral region with discogenic back pain and lower extremity pain: Secondary | ICD-10-CM

## 2023-05-07 DIAGNOSIS — M48062 Spinal stenosis, lumbar region with neurogenic claudication: Secondary | ICD-10-CM | POA: Diagnosis not present

## 2023-05-07 DIAGNOSIS — M1711 Unilateral primary osteoarthritis, right knee: Secondary | ICD-10-CM | POA: Insufficient documentation

## 2023-05-07 DIAGNOSIS — G4733 Obstructive sleep apnea (adult) (pediatric): Secondary | ICD-10-CM | POA: Insufficient documentation

## 2023-05-07 DIAGNOSIS — Z955 Presence of coronary angioplasty implant and graft: Secondary | ICD-10-CM | POA: Diagnosis not present

## 2023-05-07 DIAGNOSIS — M67361 Transient synovitis, right knee: Secondary | ICD-10-CM | POA: Insufficient documentation

## 2023-05-07 DIAGNOSIS — I10 Essential (primary) hypertension: Secondary | ICD-10-CM | POA: Insufficient documentation

## 2023-05-07 DIAGNOSIS — X58XXXS Exposure to other specified factors, sequela: Secondary | ICD-10-CM | POA: Insufficient documentation

## 2023-05-07 DIAGNOSIS — M7121 Synovial cyst of popliteal space [Baker], right knee: Secondary | ICD-10-CM | POA: Insufficient documentation

## 2023-05-07 DIAGNOSIS — I252 Old myocardial infarction: Secondary | ICD-10-CM | POA: Diagnosis not present

## 2023-05-07 DIAGNOSIS — Z794 Long term (current) use of insulin: Secondary | ICD-10-CM | POA: Insufficient documentation

## 2023-05-07 DIAGNOSIS — I2510 Atherosclerotic heart disease of native coronary artery without angina pectoris: Secondary | ICD-10-CM | POA: Diagnosis not present

## 2023-05-07 DIAGNOSIS — S83241S Other tear of medial meniscus, current injury, right knee, sequela: Secondary | ICD-10-CM | POA: Insufficient documentation

## 2023-05-07 DIAGNOSIS — S83271S Complex tear of lateral meniscus, current injury, right knee, sequela: Secondary | ICD-10-CM | POA: Diagnosis not present

## 2023-05-07 DIAGNOSIS — S83231S Complex tear of medial meniscus, current injury, right knee, sequela: Secondary | ICD-10-CM

## 2023-05-07 DIAGNOSIS — M5117 Intervertebral disc disorders with radiculopathy, lumbosacral region: Secondary | ICD-10-CM | POA: Diagnosis present

## 2023-05-07 DIAGNOSIS — R29898 Other symptoms and signs involving the musculoskeletal system: Secondary | ICD-10-CM

## 2023-05-07 DIAGNOSIS — M170 Bilateral primary osteoarthritis of knee: Secondary | ICD-10-CM

## 2023-05-07 DIAGNOSIS — M25461 Effusion, right knee: Secondary | ICD-10-CM

## 2023-05-07 DIAGNOSIS — G8929 Other chronic pain: Secondary | ICD-10-CM

## 2023-05-07 MED ORDER — SODIUM CHLORIDE (PF) 0.9 % IJ SOLN
INTRAMUSCULAR | Status: AC
  Filled 2023-05-07: qty 10

## 2023-05-07 MED ORDER — METHYLPREDNISOLONE ACETATE 80 MG/ML IJ SUSP
INTRAMUSCULAR | Status: AC
  Filled 2023-05-07: qty 1

## 2023-05-07 MED ORDER — ROPIVACAINE HCL 2 MG/ML IJ SOLN
3.0000 mL | Freq: Once | INTRAMUSCULAR | Status: AC
Start: 2023-05-07 — End: 2023-05-07
  Administered 2023-05-07: 3 mL via INTRA_ARTICULAR

## 2023-05-07 MED ORDER — METHYLPREDNISOLONE ACETATE 80 MG/ML IJ SUSP
80.0000 mg | Freq: Once | INTRAMUSCULAR | Status: AC
Start: 2023-05-07 — End: 2023-05-07
  Administered 2023-05-07: 80 mg via INTRA_ARTICULAR

## 2023-05-07 MED ORDER — SODIUM CHLORIDE 0.9% FLUSH
2.0000 mL | Freq: Once | INTRAVENOUS | Status: AC
Start: 2023-05-07 — End: 2023-05-07
  Administered 2023-05-07: 2 mL

## 2023-05-07 MED ORDER — MIDAZOLAM HCL 2 MG/2ML IJ SOLN
INTRAMUSCULAR | Status: AC
  Filled 2023-05-07: qty 2

## 2023-05-07 MED ORDER — IOHEXOL 180 MG/ML  SOLN
10.0000 mL | Freq: Once | INTRAMUSCULAR | Status: AC
  Administered 2023-05-07: 10 mL via INTRATHECAL

## 2023-05-07 MED ORDER — LIDOCAINE HCL 2 % IJ SOLN
INTRAMUSCULAR | Status: AC
  Filled 2023-05-07: qty 20

## 2023-05-07 MED ORDER — IOHEXOL 180 MG/ML  SOLN
INTRAMUSCULAR | Status: AC
  Filled 2023-05-07: qty 20

## 2023-05-07 MED ORDER — TRIAMCINOLONE ACETONIDE 40 MG/ML IJ SUSP
INTRAMUSCULAR | Status: AC
  Filled 2023-05-07: qty 1

## 2023-05-07 MED ORDER — LIDOCAINE HCL 2 % IJ SOLN
20.0000 mL | Freq: Once | INTRAMUSCULAR | Status: AC
Start: 2023-05-07 — End: 2023-05-07
  Administered 2023-05-07: 400 mg

## 2023-05-07 MED ORDER — PENTAFLUOROPROP-TETRAFLUOROETH EX AERO
INHALATION_SPRAY | Freq: Once | CUTANEOUS | Status: AC
  Administered 2023-05-07: 30 via TOPICAL

## 2023-05-07 MED ORDER — ROPIVACAINE HCL 2 MG/ML IJ SOLN
INTRAMUSCULAR | Status: AC
  Filled 2023-05-07: qty 20

## 2023-05-07 MED ORDER — TRIAMCINOLONE ACETONIDE 40 MG/ML IJ SUSP
40.0000 mg | Freq: Once | INTRAMUSCULAR | Status: AC
  Administered 2023-05-07: 40 mg

## 2023-05-07 MED ORDER — ROPIVACAINE HCL 2 MG/ML IJ SOLN
2.0000 mL | Freq: Once | INTRAMUSCULAR | Status: AC
  Administered 2023-05-07: 2 mL via EPIDURAL

## 2023-05-07 MED ORDER — LIDOCAINE HCL (PF) 2 % IJ SOLN
INTRAMUSCULAR | Status: AC
  Filled 2023-05-07: qty 5

## 2023-05-07 MED ORDER — MIDAZOLAM HCL 2 MG/2ML IJ SOLN
0.5000 mg | Freq: Once | INTRAMUSCULAR | Status: AC
  Administered 2023-05-07: 2 mg via INTRAVENOUS

## 2023-05-07 MED ORDER — LIDOCAINE HCL (PF) 2 % IJ SOLN
5.0000 mL | Freq: Once | INTRAMUSCULAR | Status: AC
  Administered 2023-05-07: 5 mL

## 2023-05-07 NOTE — Patient Instructions (Signed)

## 2023-05-08 ENCOUNTER — Telehealth: Payer: Self-pay | Admitting: *Deleted

## 2023-05-08 NOTE — Telephone Encounter (Signed)
 Spoke with patient's son, no problems post procedure.

## 2023-05-22 NOTE — Progress Notes (Addendum)
 PROVIDER NOTE: Information contained herein reflects review and annotations entered in association with encounter. Interpretation of such information and data should be left to medically-trained personnel. Information provided to patient can be located elsewhere in the medical record under "Patient Instructions". Document created using STT-dictation technology, any transcriptional errors that may result from process are unintentional.    Patient: Adrian Kim  Service Category: E/M  Provider: Candi Chafe, MD  DOB: May 29, 1940  DOS: 05/23/2023  Referring Provider: [facility], [facility] Jans[facility]*  MRN: [id number]  Specialty: Interventional Pain Management  PCP: [facility]  Type: Established Patient Encounter  Setting: Ambulatory outpatient    Location: Office  Delivery: Face-to-face     HPI  Adrian Kim, a 83 y.o. year old male, is here today because of his Chronic pain of lower extremity, bilateral [M79.604, M79.605, G89.29]. Adrian Kim primary complain today is Knee Pain (right)  Pertinent problems: 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; Chronic lower extremity pain (Bilateral) (R>L); Trigger ring finger of left hand; Chronic feet pain (Bilateral); Rib pain (Bilateral); Generalized osteoarthritis of multiple sites; Osteoarthritis of feet (Bilateral); Osteoarthritis of knees (Bilateral) (R>L); Osteoarthritis of hips (Bilateral); Vertigo; Difficulty in walking; DDD (degenerative disc disease), cervical; Osteoarthritis of facet joint of cervical spine; Cervical facet hypertrophy; Grade 1 Retrolisthesis of C3/C4; Osteoarthritis of shoulder (Right); Osteoarthritis of glenohumeral joint (Right); DDD (degenerative disc disease), lumbosacral; Lumbar facet arthropathy (L3-4, L5-S1); Lumbar facet  hypertrophy; Osteoarthritis of facet joint of lumbar spine; Tricompartment osteoarthritis of knee (Right); Osteoarthritis of hands (Bilateral); Chronic low back pain (Bilateral) w/ sciatica (Bilateral); Diabetic peripheral neuropathy (HCC); Chronic painful diabetic neuropathy (HCC); Abnormal MRI, knee (08/12/2022) (Right); Osteoarthritis of knee (Right); Complex tear of medial meniscus of knee, sequela (Right); Complex tear of lateral meniscus of knee, sequela (Right); Baker's cyst of knee (Right); Effusion of knee joint (Right); Chronic knee pain (Right); Transient synovitis of knee (Right); Burning pain; Pes anserine bursitis (Left); Medial knee pain (Left); Chronic knee pain (Left); Osteoarthritis of knee (Left); Complaints of weakness of lower extremity; Abnormal MRI, lumbar spine (01/26/2023); and Spinal stenosis, lumbar region, 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 Pain Assessment: Severity of Chronic pain is reported as a 8 /10. Location: Knee Right/denies. Onset: More than a month ago. Quality: Aching. Timing: Intermittent. Modifying factor(s): sitting. Vitals:  height is 5\' 8"  (1.727 m) and weight is 180 lb (81.6 kg). His temporal temperature is 97.1 F (36.2 C) (abnormal). His blood pressure is 96/58 (abnormal) and his pulse is 84. His respiration is 18 and oxygen saturation is 92%.  BMI: Estimated body mass index is 27.37 kg/m as calculated from the following:   Height as of this encounter: 5\' 8"  (1.727 m).   Weight as of this encounter: 180 lb (81.6 kg). Last encounter: 04/29/2023. Last procedure: 05/07/2023.  Reason for encounter: post-procedure evaluation and assessment.  Discussed the use of AI scribe software for clinical note transcription with the patient, who gave verbal consent to proceed.  History of Present Illness   Adrian Kim is an 83 year old male who presents with persistent knee pain and balance issues.  He experiences persistent pain in  his right knee, particularly during ambulation. He received an injection in the knee, which provided some relief, but the pain persists with weight-bearing activities.  He has a  history of lower back pain that was previously managed with an injection, which has alleviated the pain. However, there is concern about potential recurrence of back pain, which could affect his knee pain and balance.  He has issues with balance, which may be related to weakness in the legs. This weakness could be due to muscle atrophy or nerve issues stemming from his back problems. He experienced a fall recently, resulting in a sprained shoulder or wrist, but no fractures were noted.  At home, he does not have permanent grab bars in the bathroom, but uses suction cup bars for support. These require regular adjustment to ensure safety.      Post-procedure evaluation   Procedure No.1: Lumbar epidural steroid injection (LESI) (interlaminar) #1    Laterality: Right   Level:  L2-3 Level.  Imaging: Fluoroscopic guidance Spinal ([id number]) Anesthesia: Local anesthesia (1-2% Lidocaine ) Anxiolysis: IV Versed          Sedation: No Sedation                       DOS: 05/07/2023  Performed by: Candi Chafe, MD  Purpose: Diagnostic/Therapeutic Indications: Lumbar radicular pain of intraspinal etiology of more than 4 weeks that has failed to respond to conservative therapy and is severe enough to impact quality of life or function. 1. Chronic lower extremity pain (Bilateral) (R>L)   2. Chronic low back pain (Bilateral) w/ sciatica (Bilateral)   3. 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 Complaints of weakness of lower extremity   5. Degeneration of intervertebral disc of lumbosacral region with discogenic back pain and lower extremity pain   6. Chronic knee pain (1ry area of Pain) (Bilateral) (R>L)    Procedure No.2:  Steroid Intra-articular Knee Injection #2  Laterality: Right  (-RT) Level/approach: Medial Imaging guidance: None required ([id number]) Anesthesia: Local anesthesia (1-2% Lidocaine )  Purpose: Diagnostic/Therapeutic Indications: Knee arthralgia associated to osteoarthritis of the knee 1. Chronic knee pain (Right)   2. Osteoarthritis of knee (Right)   3. Tricompartment osteoarthritis of knee (Right)   4. Complex tear of lateral meniscus of knee, sequela (Right)   5. Complex tear of medial meniscus of knee, sequela (Right)   6. Effusion of knee joint (Right)   7. Transient synovitis of knee (Right)   8. Baker's cyst of knee (Right)   9. Chronic knee pain (1ry area of Pain) (Bilateral) (R>L)   10. Osteoarthritis of knees (Bilateral) (R>L)    NAS-11 Pain score:   Pre-procedure: 10-Worst pain ever/10   Post-procedure: 0-No pain/10    Effectiveness:  Initial hour after procedure: 100 %. Subsequent 4-6 hours post-procedure: 100 %. Analgesia past initial 6 hours: 100 %. Ongoing improvement:  Analgesic: The patient indicated having attained 100% relief of the pain for the duration of local anesthetic followed by an ongoing 100% relief of the pain in the lower back and knees.  He refers only having intermittent pain on the left knee, primarily when he is walking. Function: Adrian Kim reports improvement in function ROM: Adrian Kim reports improvement in ROM  Pharmacotherapy Assessment  Analgesic: No chronic opioid analgesics therapy prescribed by our practice. None MME/day: 0 mg/day   Monitoring: [place] PMP: PDMP not reviewed this encounter.       Pharmacotherapy: No side-effects or adverse reactions reported. Compliance: No problems identified. Effectiveness: Clinically acceptable.  No notes on file  No results found for: "CBDTHCR" No results found for: "D8THCCBX" No results found for: "D9THCCBX"  UDS:  Summary  Date Value Ref Range Status  04/09/2022 Note  Final    Comment:     ==================================================================== Compliance Drug Analysis, Ur ==================================================================== Test                             Result       Flag       Units  Drug Present not Declared for Prescription Verification   Alcohol, Ethyl                 0.048        UNEXPECTED g/dL    Sources of ethyl alcohol include alcoholic beverages or as a    fermentation product of glucose; glucose is present in this specimen.    Interpret result with caution, as the presence of ethyl alcohol is    likely due, at least in part, to fermentation of glucose.  Drug Absent but Declared for Prescription Verification   Acetaminophen                   Not Detected UNEXPECTED    Acetaminophen , as indicated in the declared medication list, is not    always detected even when used as directed.    Salicylate                     Not Detected UNEXPECTED    Aspirin, as indicated in the declared medication list, is not always    detected even when used as directed.    Ibuprofen                       Not Detected UNEXPECTED    Ibuprofen , as indicated in the declared medication list, is not    always detected even when used as directed.  ==================================================================== Test                      Result    Flag   Units      Ref Range   Creatinine              39               mg/dL      >=53 ==================================================================== Declared Medications:  The flagging and interpretation on this report are based on the  following declared medications.  Unexpected results may arise from  inaccuracies in the declared medications.   **Note: The testing scope of this panel does not include small to  moderate amounts of these reported medications:   Acetaminophen  (Tylenol )  Aspirin  Ibuprofen    **Note: The testing scope of this panel does not include the  following reported  medications:   Betamethasone   Canagliflozin (Invokana)  Cetirizine (Zyrtec)  Dorzolamide  Fluticasone  Latanoprost (Xalatan)  Linagliptin (Tradjenta)  Lisinopril (Zestril)  Metformin  Rosuvastatin (Crestor)  Semaglutide (Rybelsus)  Sitagliptin (Januvia)  Tamsulosin  (Flomax )  Timolol  (Timoptic ) ==================================================================== For clinical consultation, please call [phone]. ====================================================================       ROS  Constitutional: Denies any fever or chills Gastrointestinal: No reported hemesis, hematochezia, vomiting, or acute GI distress Musculoskeletal: Denies any acute onset joint swelling, redness, loss of ROM, or weakness Neurological: No reported episodes of acute onset apraxia, aphasia, dysarthria, agnosia, amnesia, paralysis, loss of coordination, or loss of consciousness  Medication Review  Semaglutide, TRUEplus Lancets 28G, Vitamin B-12, acetaminophen , aspirin EC, canagliflozin, cetirizine, dorzolamide-timolol , fluticasone, glucose blood, ibuprofen , latanoprost, linagliptin,  lisinopril, meclizine , metFORMIN, rosuvastatin, sitaGLIPtin, tamsulosin , and timolol   History Review  Allergy: Adrian Kim is allergic to zolpidem. Drug: Adrian Kim  reports no history of drug use. Alcohol:  reports no history of alcohol use. Tobacco:  reports that he has quit smoking. He has never used smokeless tobacco. Social: Adrian Kim  reports that he has quit smoking. He has never used smokeless tobacco. He reports that he does not drink alcohol and does not use drugs. Medical:  has a past medical history of Arthritis, Diabetes (HCC), Glaucoma, Heart attack (HCC), HLD (hyperlipidemia), HTN (hypertension), Lower back pain, Sleep apnea, and Tinnitus. Surgical: Adrian Kim  has a past surgical history that includes Coronary angioplasty with stent; Colonoscopy with propofol  (N/A, 12/10/2014); and Cataract  extraction w/PHACO (Right, 11/16/2015). Family: family history includes Leukemia in his son.  Laboratory Chemistry Profile   Renal Lab Results  Component Value Date   BUN 9 01/26/2023   CREATININE 0.65 01/26/2023   GFRNONAA >60 01/26/2023    Hepatic Lab Results  Component Value Date   AST 16 04/09/2022   ALT 12 04/09/2022   ALBUMIN 4.4 04/09/2022   ALKPHOS 57 04/09/2022    Electrolytes Lab Results  Component Value Date   NA 137 01/26/2023   K 3.8 01/26/2023   CL 101 01/26/2023   CALCIUM 9.1 01/26/2023   MG 2.1 04/09/2022    Bone Lab Results  Component Value Date   25OHVITD1 6.8 (L) 04/09/2022   25OHVITD2 <1.0 04/09/2022   25OHVITD3 6.8 04/09/2022    Inflammation (CRP: Acute Phase) (ESR: Chronic Phase) Lab Results  Component Value Date   CRP 0.5 04/09/2022   ESRSEDRATE 4 04/09/2022         Note: Above Lab results reviewed.  Recent Imaging Review  DG PAIN CLINIC C-ARM 1-60 MIN NO REPORT Fluoro was used, but no Radiologist interpretation will be provided.  Please refer to "NOTES" tab for provider progress note. Note: Reviewed        Physical Exam  General appearance: Well nourished, well developed, and well hydrated. In no apparent acute distress Mental status: Alert, oriented x 3 (person, place, & time)       Respiratory: No evidence of acute respiratory distress Eyes: PERLA Vitals: BP (!) 96/58 (Cuff Size: Normal)   Pulse 84   Temp (!) 97.1 F (36.2 C) (Temporal)   Resp 18   Ht 5\' 8"  (1.727 m)   Wt 180 lb (81.6 kg)   SpO2 92%   BMI 27.37 kg/m  BMI: Estimated body mass index is 27.37 kg/m as calculated from the following:   Height as of this encounter: 5\' 8"  (1.727 m).   Weight as of this encounter: 180 lb (81.6 kg). Ideal: Ideal body weight: 68.4 kg (150 lb 12.7 oz) Adjusted ideal body weight: 73.7 kg (162 lb 7.6 oz)  Assessment   Diagnosis Status  1. Chronic lower extremity pain (Bilateral) (R>L)   2. Chronic knee pain (1ry area of Pain)  (Bilateral) (R>L)   3. Chronic knee pain (Right)   4. 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 Complaints of weakness of lower extremity   6. Postop check    Controlled Controlled Controlled   Updated Problems: No problems updated.  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 Previous injections provided limited relief, suggesting advanced degeneration that may require  knee arthroplasty. Discussed nerve blocks and radiofrequency ablation for pain management and surgical delay, offering pain relief lasting three to eighteen months with repeatability. These interventions do not address balance and weakness issues. Place PRN order for nerve block when pain becomes constant. Consider radiofrequency ablation if nerve block is effective. Encourage physical therapy to improve strength and mobility.  Back Pain   Lower back pain resolved post-injection, but recurrence risk exists, potentially affecting knee pain and balance. Monitor for recurrence and consider surgical consultation if weakness persists due to nerve issues. Muscle strength and nerve function are crucial for balance and mobility.  Balance Issues   Balance issues may stem from weakness due to back problems or knee pain. Muscle strength and nerve function are vital for balance. Recommend physical therapy to improve strength and balance.  Fall Risk   He is at risk of falls, evidenced by a recent fall causing shoulder or wrist injury. Discussed home safety modifications, including grab bars in the bathroom. Recommend installation of suction cup grab bars as cost-effective and safe, with instructions for proper installation and maintenance.       Adrian Kim has a current medication list which includes the following long-term medication(s): cetirizine, fluticasone,  linagliptin, lisinopril, metformin, rosuvastatin, sitagliptin, and vitamin b-12.  Pharmacotherapy (Medications Ordered): No orders of the defined types were placed in this encounter.  Orders:  Orders Placed This Encounter  Procedures   GENICULAR NERVE BLOCK    Indication(s):  Sub-acute knee pain    Standing Status:   Standing    Number of Occurrences:   1    Expiration Date:   11/22/2023    Scheduling Instructions:     Side: Left-sided     Sedation: With Sedation.     Timeframe: PRN    Where will this procedure be performed?:   [facility]   Nursing Instructions:    Please complete this patient's postprocedure evaluation.    Scheduling Instructions:     Please complete this patient's postprocedure evaluation.   Follow-up plan:   Return if symptoms worsen or fail to improve, for (PRN) , (ECT): (R) Genicular NB.      Interventional Therapies  Risk Factors  Considerations:  Language barrier: [place] (interpreter required)  IDDM  CAD  Hx. MI  (B) Carotid A. Stenosis  HTN  AoV stenosis  Mitral & Tricuspid V. insufficiency  OSA   Planned  Pending:  Diagnostic/therapeutic right genicular NB #1 (PRN)    Under consideration:   Diagnostic/therapeutic right genicular NB #1  Possible right genicular RFA #1    Completed:   Diagnostic/therapeutic midline-right L2-3 LESI x1 (05/07/2023) (100/100/100/100)  Therapeutic right IA steroid knee inj. (medial approach) x2 (05/07/2023) (100/100/100/100)  Therapeutic left pes anserine bursa steroid knee inj. x1  Therapeutic bilateral IA Zilretta  knee injection x1 (11/06/2022) (100/100/75/75) Therapeutic bilateral lower extremity Qutenza  Tx x1 (11/06/2022) (50/50/75/75) Diagnostic/therapeutic bilateral IA steroid knee inj. x1 (05/15/2022) (9-0/10) (100/100/95-100)  (10/10/2022) Referral to orthopedic surgery   Completed by other providers:      Therapeutic  Palliative (PRN) options:   None established     Recent  Visits Date Type Provider Dept  05/23/23 Office Visit Renaldo Caroli, MD [facility]  05/07/23 Procedure visit Renaldo Caroli, MD [facility]  04/29/23 Office Visit Renaldo Caroli, MD [facility]  Showing recent visits within past 90 days and meeting all other requirements Today's Visits Date Type Provider Dept  06/27/23 Procedure visit Renaldo Caroli, MD [facility]  Showing today's visits and meeting all other requirements Future Appointments No visits were found meeting these conditions. Showing future appointments within next 90 days and meeting all other requirements  I discussed the assessment and treatment plan with the patient. The patient was provided an opportunity to ask questions and all were answered. The patient agreed with the plan and demonstrated an understanding of the instructions.  Patient advised to call back or seek an in-person evaluation if the symptoms or condition worsens.  Duration of encounter: 30 minutes.  Total time on encounter, as per AMA guidelines included both the face-to-face and non-face-to-face time personally spent by the physician and/or other qualified health care professional(s) on the day of the encounter (includes time in activities that require the physician or other qualified health care professional and does not include time in activities normally performed by clinical staff). Physician's time may include the following activities when performed: Preparing to see the patient (e.g., pre-charting review of records, searching for previously ordered imaging, lab work, and nerve conduction tests) Review of prior analgesic pharmacotherapies. Reviewing PMP Interpreting ordered tests (e.g., lab work, imaging, nerve conduction tests) Performing post-procedure evaluations, including interpretation of diagnostic procedures Obtaining and/or reviewing separately obtained history Performing a medically  appropriate examination and/or evaluation Counseling and educating the patient/family/caregiver Ordering medications, tests, or procedures Referring and communicating with other health care professionals (when not separately reported) Documenting clinical information in the electronic or other health record Independently interpreting results (not separately reported) and communicating results to the patient/ family/caregiver Care coordination (not separately reported)  Note by: Candi Chafe, MD Date: 05/23/2023; Time: 10:50 AM

## 2023-05-23 ENCOUNTER — Ambulatory Visit: Attending: Pain Medicine | Admitting: Pain Medicine

## 2023-05-23 ENCOUNTER — Encounter: Payer: Self-pay | Admitting: Pain Medicine

## 2023-05-23 VITALS — BP 96/58 | HR 84 | Temp 97.1°F | Resp 18 | Ht 68.0 in | Wt 180.0 lb

## 2023-05-23 DIAGNOSIS — R29898 Other symptoms and signs involving the musculoskeletal system: Secondary | ICD-10-CM | POA: Diagnosis present

## 2023-05-23 DIAGNOSIS — Z09 Encounter for follow-up examination after completed treatment for conditions other than malignant neoplasm: Secondary | ICD-10-CM | POA: Insufficient documentation

## 2023-05-23 DIAGNOSIS — M25561 Pain in right knee: Secondary | ICD-10-CM | POA: Insufficient documentation

## 2023-05-23 DIAGNOSIS — M25562 Pain in left knee: Secondary | ICD-10-CM | POA: Diagnosis present

## 2023-05-23 DIAGNOSIS — G8929 Other chronic pain: Secondary | ICD-10-CM | POA: Diagnosis present

## 2023-05-23 DIAGNOSIS — M5442 Lumbago with sciatica, left side: Secondary | ICD-10-CM | POA: Diagnosis present

## 2023-05-23 DIAGNOSIS — M79604 Pain in right leg: Secondary | ICD-10-CM | POA: Diagnosis present

## 2023-05-23 DIAGNOSIS — M5441 Lumbago with sciatica, right side: Secondary | ICD-10-CM | POA: Insufficient documentation

## 2023-05-23 DIAGNOSIS — M79605 Pain in left leg: Secondary | ICD-10-CM | POA: Diagnosis present

## 2023-05-23 NOTE — Patient Instructions (Signed)

## 2023-06-27 ENCOUNTER — Ambulatory Visit: Attending: Pain Medicine | Admitting: Pain Medicine

## 2023-06-27 ENCOUNTER — Ambulatory Visit
Admission: RE | Admit: 2023-06-27 | Discharge: 2023-06-27 | Disposition: A | Discharge status: Home / Self Care | Source: Ambulatory Visit | Attending: Pain Medicine | Admitting: Pain Medicine

## 2023-06-27 ENCOUNTER — Encounter: Payer: Self-pay | Admitting: Pain Medicine

## 2023-06-27 VITALS — BP 97/59 | Temp 97.7°F | Resp 16 | Ht 67.0 in | Wt 180.0 lb

## 2023-06-27 DIAGNOSIS — M1711 Unilateral primary osteoarthritis, right knee: Secondary | ICD-10-CM | POA: Insufficient documentation

## 2023-06-27 DIAGNOSIS — Z5189 Encounter for other specified aftercare: Secondary | ICD-10-CM | POA: Diagnosis present

## 2023-06-27 DIAGNOSIS — R936 Abnormal findings on diagnostic imaging of limbs: Secondary | ICD-10-CM | POA: Diagnosis present

## 2023-06-27 DIAGNOSIS — S83271S Complex tear of lateral meniscus, current injury, right knee, sequela: Secondary | ICD-10-CM | POA: Diagnosis present

## 2023-06-27 DIAGNOSIS — M7121 Synovial cyst of popliteal space [Baker], right knee: Secondary | ICD-10-CM | POA: Insufficient documentation

## 2023-06-27 DIAGNOSIS — M25561 Pain in right knee: Secondary | ICD-10-CM | POA: Diagnosis present

## 2023-06-27 DIAGNOSIS — S83231S Complex tear of medial meniscus, current injury, right knee, sequela: Secondary | ICD-10-CM | POA: Diagnosis present

## 2023-06-27 DIAGNOSIS — G8929 Other chronic pain: Secondary | ICD-10-CM | POA: Diagnosis present

## 2023-06-27 DIAGNOSIS — M25562 Pain in left knee: Secondary | ICD-10-CM

## 2023-06-27 DIAGNOSIS — M25461 Effusion, right knee: Secondary | ICD-10-CM | POA: Insufficient documentation

## 2023-06-27 MED ORDER — LIDOCAINE HCL 2 % IJ SOLN
INTRAMUSCULAR | Status: AC
  Filled 2023-06-27: qty 20

## 2023-06-27 MED ORDER — MIDAZOLAM HCL 5 MG/5ML IJ SOLN
0.5000 mg | Freq: Once | INTRAMUSCULAR | Status: DC
Start: 2023-06-27 — End: 2023-06-27

## 2023-06-27 MED ORDER — ROPIVACAINE HCL 2 MG/ML IJ SOLN
INTRAMUSCULAR | Status: AC
  Filled 2023-06-27: qty 20

## 2023-06-27 MED ORDER — METHYLPREDNISOLONE ACETATE 80 MG/ML IJ SUSP
INTRAMUSCULAR | Status: AC
  Filled 2023-06-27: qty 1

## 2023-06-27 MED ORDER — LIDOCAINE HCL 2 % IJ SOLN
20.0000 mL | Freq: Once | INTRAMUSCULAR | Status: AC
  Administered 2023-06-27: 400 mg

## 2023-06-27 MED ORDER — METHYLPREDNISOLONE ACETATE 80 MG/ML IJ SUSP
80.0000 mg | Freq: Once | INTRAMUSCULAR | Status: AC
  Administered 2023-06-27: 80 mg

## 2023-06-27 MED ORDER — PENTAFLUOROPROP-TETRAFLUOROETH EX AERO: INHALATION_SPRAY | Freq: Once | CUTANEOUS | Status: DC

## 2023-06-27 MED ORDER — FENTANYL CITRATE (PF) 100 MCG/2ML IJ SOLN: 25.0000 ug | INTRAMUSCULAR | Status: DC | PRN

## 2023-06-27 MED ORDER — ROPIVACAINE HCL 2 MG/ML IJ SOLN
9.0000 mL | Freq: Once | INTRAMUSCULAR | Status: AC
  Administered 2023-06-27: 9 mL

## 2023-06-27 NOTE — Progress Notes (Signed)
 Safety precautions to be maintained throughout the outpatient stay will include: orient to surroundings, keep bed in low position, maintain call bell within reach at all times, provide assistance with transfer out of bed and ambulation.

## 2023-06-27 NOTE — Patient Instructions (Addendum)

## 2023-06-27 NOTE — Progress Notes (Signed)
 PROVIDER NOTE: Interpretation of information contained herein should be left to medically-trained personnel. Specific patient instructions are provided elsewhere under "Patient Instructions" section of medical record. This document was created in part using STT-dictation technology, any transcriptional errors that may result from this process are unintentional.  Patient: Adrian Kim Type: Established DOB: June 06, 1940 MRN: [id number] PCP: [facility]  Service: Procedure DOS: 06/27/2023 Setting: Ambulatory Location: Ambulatory outpatient facility Delivery: Face-to-face Provider: Candi Chafe, MD Specialty: Interventional Pain Management Specialty designation: 09 Location: Outpatient facility Ref. Prov.: [facility]       Interventional Therapy   Procedure:                Type: Genicular Nerves Block (Superolateral, Superomedial, and Inferomedial Genicular Nerves)  #1  Laterality: Right (-RT)  Level: Superior and inferior to the knee joint.  Imaging: Fluoroscopic guidance Anesthesia: Local anesthesia (1-2% Lidocaine ) Anxiolysis: None                 Sedation: No Sedation                       DOS: 06/27/2023  Performed by: Candi Chafe, MD  Purpose: Diagnostic/Therapeutic Indications: Chronic knee pain severe enough to impact quality of life or function. Rationale (medical necessity): procedure needed and proper for the diagnosis and/or treatment of Mr. Hokenson's medical symptoms and needs. 1. Chronic knee pain (Right)   2. Complex tear of lateral meniscus of knee, sequela (Right)   3. Complex tear of medial meniscus of knee, sequela (Right)   4. Abnormal MRI, knee (08/12/2022) (Right)   5. Baker's cyst of knee (Right)   6. Effusion of knee joint (Right)   7. Osteoarthritis of knee (Right)   8. Encounter for therapeutic procedure    NAS-11 Pain score:   Pre-procedure: 8 /10   Post-procedure: 0-No pain/10     Target: For  Genicular Nerve block(s), the targets are: the superolateral genicular nerve, located in the lateral distal portion of the femoral shaft as it curves to form the lateral epicondyle, in the region of the distal femoral metaphysis; the superomedial genicular nerve, located in the medial distal portion of the femoral shaft as it curves to form the medial epicondyle; and the inferomedial genicular nerve, located in the medial, proximal portion of the tibial shaft, as it curves to form the medial epicondyle, in the region of the proximal tibial metaphysis.  Location: Superolateral, Superomedial, and Inferomedial aspects of knee joint.  Region: Lateral, Anterior, and Medial aspects of the knee joint, above and below the knee joint proper. Approach: Percutaneous  Type of procedure: Percutaneous perineural nerve block. The genicular nerve block is a motor-sparing technique that anesthetizes the sensory terminal branches innervating the knee joint, resulting in anesthesia of the anterior compartment of the knee. The distribution of anesthesia of each nerve is mostly in the corresponding quadrant.  Neuroanatomy: The superolateral genicular nerve (SLGN) courses around the femur shaft to pass between the vastus lateralis and the lateral epicondyle. It accompanies the superior lateral genicular artery. The superomedial genicular nerve (SMGN) courses around the femur shaft, following the superior medial genicular artery, to pass between the adductor magnus tendon and the medial epicondyle below the vastus medialis. The inferolateral genicular nerve (ILGN) courses around the tibial lateral epicondyle deep to the lateral collateral ligament, following the inferior lateral genicular artery, superior of the fibula head. The inferomedial genicular nerve (IMGN) courses horizontally below the medial collateral  ligament between the tibial medial epicondyle and the insertion of the collateral ligament. It accompanies the inferior  medial genicular artery. The recurrent peroneal nerve originates in the inferior popliteal region from the common peroneal nerve and courses horizontally around the fibula to pass just inferior of the fibula head and travel superior to the anterolateral tibial epicondyle. It accompanies the recurrent tibial artery.  Position / Prep / Materials:  Position: Supine, Modified Fowler's position with pillows under the targeted knee(s). The patient is placed in a supine position with the knee slightly flexed by placing a pillow in the popliteal fossa. Prep solution: ChloraPrep (2% chlorhexidine gluconate and 70% isopropyl alcohol) Prep Area: Entire knee area, from mid-thigh to mid-shin, lateral, anterior, and medial aspects. Materials:  Tray: Block Needle(s):  Type: Spinal  Gauge (G): 22  Length: 3.5-in  Qty: 3  H&P (Pre-op Assessment):  Mr. Caton is a 83 y.o. (year old), male patient, seen today for interventional treatment. He  has a past surgical history that includes Coronary angioplasty with stent; Colonoscopy with propofol  (N/A, 12/10/2014); and Cataract extraction w/PHACO (Right, 11/16/2015). Mr. Sgro has a current medication list which includes the following prescription(s): acetaminophen , aspirin ec, cetirizine, dorzolamide-timolol , fluticasone, glucose blood, ibuprofen , invokana, latanoprost, linagliptin, lisinopril, meclizine , metformin, rosuvastatin, rybelsus, sitagliptin, tamsulosin , timolol , trueplus lancets 28g, and vitamin b-12, and the following Facility-Administered Medications: pentafluoroprop-tetrafluoroeth. His primarily concern today is the Knee Pain (left)  Initial Vital Signs:  Pulse/HCG Rate:  ECG Heart Rate: 71 Temp: 97.7 F (36.5 C) Resp: 18 BP: (!) 82/51 SpO2: 96 %  BMI: Estimated body mass index is 28.19 kg/m as calculated from the following:   Height as of this encounter: 5\' 7"  (1.702 m).   Weight as of this encounter: 180 lb (81.6 kg).  Risk  Assessment: Allergies: Reviewed. He is allergic to zolpidem.  Allergy Precautions: None required Coagulopathies: Reviewed. None identified.  Blood-thinner therapy: None at this time Active Infection(s): Reviewed. None identified. Mr. Mahnken is afebrile  Site Confirmation: Mr. Viviano was asked to confirm the procedure and laterality before marking the site Procedure checklist: Completed Consent: Before the procedure and under the influence of no sedative(s), amnesic(s), or anxiolytics, the patient was informed of the treatment options, risks and possible complications. To fulfill our ethical and legal obligations, as recommended by the American Medical Association's Code of Ethics, I have informed the patient of my clinical impression; the nature and purpose of the treatment or procedure; the risks, benefits, and possible complications of the intervention; the alternatives, including doing nothing; the risk(s) and benefit(s) of the alternative treatment(s) or procedure(s); and the risk(s) and benefit(s) of doing nothing. The patient was provided information about the general risks and possible complications associated with the procedure. These may include, but are not limited to: failure to achieve desired goals, infection, bleeding, organ or nerve damage, allergic reactions, paralysis, and death. In addition, the patient was informed of those risks and complications associated to the procedure, such as failure to decrease pain; infection; bleeding; organ or nerve damage with subsequent damage to sensory, motor, and/or autonomic systems, resulting in permanent pain, numbness, and/or weakness of one or several areas of the body; allergic reactions; (i.e.: anaphylactic reaction); and/or death. Furthermore, the patient was informed of those risks and complications associated with the medications. These include, but are not limited to: allergic reactions (i.e.: anaphylactic or anaphylactoid reaction(s));  adrenal axis suppression; blood sugar elevation that in diabetics may result in ketoacidosis or comma; water retention that in patients with  history of congestive heart failure may result in shortness of breath, pulmonary edema, and decompensation with resultant heart failure; weight gain; swelling or edema; medication-induced neural toxicity; particulate matter embolism and blood vessel occlusion with resultant organ, and/or nervous system infarction; and/or aseptic necrosis of one or more joints. Finally, the patient was informed that Medicine is not an exact science; therefore, there is also the possibility of unforeseen or unpredictable risks and/or possible complications that may result in a catastrophic outcome. The patient indicated having understood very clearly. We have given the patient no guarantees and we have made no promises. Enough time was given to the patient to ask questions, all of which were answered to the patient's satisfaction. Mr. Friesz has indicated that he wanted to continue with the procedure. Attestation: I, the ordering provider, attest that I have discussed with the patient the benefits, risks, side-effects, alternatives, likelihood of achieving goals, and potential problems during recovery for the procedure that I have provided informed consent. Date  Time: 06/27/2023 10:39 AM  Pre-Procedure Preparation:  Monitoring: As per clinic protocol. Respiration, ETCO2, SpO2, BP, heart rate and rhythm monitor placed and checked for adequate function Safety Precautions: Patient was assessed for positional comfort and pressure points before starting the procedure. Time-out: I initiated and conducted the "Time-out" before starting the procedure, as per protocol. The patient was asked to participate by confirming the accuracy of the "Time Out" information. Verification of the correct person, site, and procedure were performed and confirmed by me, the nursing staff, and the patient.  "Time-out" conducted as per Joint Commission's Universal Protocol (UP.01.01.01). Time: 1201 Start Time: 1201 hrs.  Description/Narrative of Procedure:          Rationale (medical necessity): procedure needed and proper for the diagnosis and/or treatment of the patient's medical symptoms and needs. Procedural Technique Safety Precautions: Aspiration looking for blood return was conducted prior to all injections. At no point did we inject any substances, as a needle was being advanced. No attempts were made at seeking any paresthesias. Safe injection practices and needle disposal techniques used. Medications properly checked for expiration dates. SDV (single dose vial) medications used. Description of the Procedure: Protocol guidelines were followed. The patient was assisted into a comfortable position. The target area was identified and the area prepped in the usual manner. Skin & deeper tissues infiltrated with local anesthetic. Appropriate amount of time allowed to pass for local anesthetics to take effect. The procedure needles were then advanced to the target area. Proper needle placement secured. Negative aspiration confirmed. Solution injected in intermittent fashion, asking for systemic symptoms every 0.5cc of injectate. The needles were then removed and the area cleansed, making sure to leave some of the prepping solution back to take advantage of its long term bactericidal properties.             Vitals:   06/27/23 1036 06/27/23 1039 06/27/23 1201 06/27/23 1204  BP: (!) 82/51 (!) 96/58 104/65 (!) 97/59  Resp: 18  18 16   Temp: 97.7 F (36.5 C)     SpO2: 96%  98% 95%  Weight: 180 lb (81.6 kg)     Height: 5\' 7"  (1.702 m)        Start Time: 1201 hrs. End Time: 1204 hrs.  Imaging Guidance (Non-Spinal):          Type of Imaging Technique: Fluoroscopy Guidance (Non-Spinal) Indication(s): Fluoroscopy guidance for needle placement to enhance accuracy in procedures requiring precise  needle localization for targeted delivery of medication  in or near specific anatomical locations not easily accessible without such real-time imaging assistance. Exposure Time: Please see nurses notes. Contrast: None used. Fluoroscopic Guidance: I was personally present during the use of fluoroscopy. "Tunnel Vision Technique" used to obtain the best possible view of the target area. Parallax error corrected before commencing the procedure. "Direction-depth-direction" technique used to introduce the needle under continuous pulsed fluoroscopy. Once target was reached, antero-posterior, oblique, and lateral fluoroscopic projection used confirm needle placement in all planes. Images permanently stored in EMR. Interpretation: No contrast injected. I personally interpreted the imaging intraoperatively. Adequate needle placement confirmed in multiple planes. Permanent images saved into the patient's record.  Post-operative Assessment:  Post-procedure Vital Signs:  Pulse/HCG Rate:  71 Temp: 97.7 F (36.5 C) Resp: 16 BP: (!) 97/59 SpO2: 95 %  EBL: None  Complications: No immediate post-treatment complications observed by team, or reported by patient.  Note: The patient tolerated the entire procedure well. A repeat set of vitals were taken after the procedure and the patient was kept under observation following institutional policy, for this type of procedure. Post-procedural neurological assessment was performed, showing return to baseline, prior to discharge. The patient was provided with post-procedure discharge instructions, including a section on how to identify potential problems. Should any problems arise concerning this procedure, the patient was given instructions to immediately contact us , at any time, without hesitation. In any case, we plan to contact the patient by telephone for a follow-up status report regarding this interventional procedure.  Comments:  No additional relevant  information.  Plan of Care (POC)  Orders:  Orders Placed This Encounter  Procedures   GENICULAR NERVE BLOCK    Scheduling Instructions:     Side(s): Right Knee     Level(s): Superior-Lateral, Superior-Medial, and Inferior-Medial Genicular Nerves     Sedation: With Sedation.     Timeframe: Today    Where will this procedure be performed?:   [facility]   [facility] C-ARM 1-60 MIN NO REPORT    Intraoperative interpretation by procedural physician at [facility].    Standing Status:   Standing    Number of Occurrences:   1    Reason for exam::   Assistance in needle guidance and placement for procedures requiring needle placement in or near specific anatomical locations not easily accessible without such assistance.   Informed Consent Details: Physician/Practitioner Attestation; Transcribe to consent form and obtain patient signature    Note: Always confirm laterality of pain with Mr. Kinslow, before procedure. Transcribe to consent form and obtain patient signature.    Physician/Practitioner attestation of informed consent for procedure/surgical case:   I, the physician/practitioner, attest that I have discussed with the patient the benefits, risks, side effects, alternatives, likelihood of achieving goals and potential problems during recovery for the procedure that I have provided informed consent.    Procedure:   Block of the genicular nerves of the knee (Genicular Nerves Block)    Physician/Practitioner performing the procedure:   Jeshawn Melucci A. Aloria Looper, MD    Indication/Reason:   Chronic knee pain   Provide equipment / supplies at bedside    Procedure tray: "Block Tray" (Disposable  single use) Skin infiltration needle: Regular 1.5-in, 25-G, (x1) Block Needle type: Spinal Amount/quantity: 3 Size: Regular (3.5-inch) Gauge: 22G    Standing Status:   Standing    Number of Occurrences:   1    Specify:   Block Tray   Chronic Opioid Analgesic:   No chronic  opioid analgesics therapy prescribed by our practice. None MME/day: 0 mg/day    Medications ordered for procedure: Meds ordered this encounter  Medications   lidocaine  (XYLOCAINE ) 2 % (with pres) injection 400 mg   pentafluoroprop-tetrafluoroeth (GEBAUERS) aerosol   DISCONTD: midazolam  (VERSED ) 5 MG/5ML injection 0.5-2 mg    Make sure Flumazenil is available in the pyxis when using this medication. If oversedation occurs, administer 0.2 mg IV over 15 sec. If after 45 sec no response, administer 0.2 mg again over 1 min; may repeat at 1 min intervals; not to exceed 4 doses (1 mg)   DISCONTD: fentaNYL  (SUBLIMAZE ) injection 25-50 mcg    Make sure Narcan is available in the pyxis when using this medication. In the event of respiratory depression (RR< 8/min): Titrate NARCAN (naloxone) in increments of 0.1 to 0.2 mg IV at 2-3 minute intervals, until desired degree of reversal.   ropivacaine  (PF) 2 mg/mL (0.2%) (NAROPIN ) injection 9 mL   methylPREDNISolone  acetate (DEPO-MEDROL ) injection 80 mg   Medications administered: We administered lidocaine , ropivacaine  (PF) 2 mg/mL (0.2%), and methylPREDNISolone  acetate.  See the medical record for exact dosing, route, and time of administration.  Follow-up plan:   Return in about 2 weeks (around 07/11/2023) for (Face2F), (PPE).       Interventional Therapies  Risk Factors  Considerations:  Language barrier: [place] (interpreter required)  IDDM  CAD  Hx. MI  (B) Carotid A. Stenosis  HTN  AoV stenosis  Mitral & Tricuspid V. insufficiency  OSA   Planned  Pending:  Diagnostic/therapeutic right genicular NB #1 (PRN)    Under consideration:   Diagnostic/therapeutic right genicular NB #1  Possible right genicular RFA #1    Completed:   Diagnostic/therapeutic midline-right L2-3 LESI x1 (05/07/2023) (100/100/100/100)  Therapeutic right IA steroid knee inj. (medial approach) x2 (05/07/2023) (100/100/100/100)  Therapeutic left pes anserine bursa  steroid knee inj. x1  Therapeutic bilateral IA Zilretta  knee injection x1 (11/06/2022) (100/100/75/75) Therapeutic bilateral lower extremity Qutenza  Tx x1 (11/06/2022) (50/50/75/75) Diagnostic/therapeutic bilateral IA steroid knee inj. x1 (05/15/2022) (9-0/10) (100/100/95-100)  (10/10/2022) Referral to orthopedic surgery   Completed by other providers:      Therapeutic  Palliative (PRN) options:   None established      Recent Visits Date Type Provider Dept  05/23/23 Office Visit Renaldo Caroli, MD [facility]  05/07/23 Procedure visit Renaldo Caroli, MD [facility]  04/29/23 Office Visit Renaldo Caroli, MD [facility]  Showing recent visits within past 90 days and meeting all other requirements Today's Visits Date Type Provider Dept  06/27/23 Procedure visit Renaldo Caroli, MD [facility]  Showing today's visits and meeting all other requirements Future Appointments Date Type Provider Dept  07/10/23 Appointment Renaldo Caroli, MD [facility]  Showing future appointments within next 90 days and meeting all other requirements  Disposition: Discharge home  Discharge (Date  Time): 06/27/2023; 1215 hrs.   Primary Care Physician: [facility] Location: [facility] Note by: Candi Chafe, MD (TTS technology used. I apologize for any typographical errors that were not detected and corrected.) Date: 06/27/2023; Time: 12:40 PM  Disclaimer:  Medicine is not an Visual merchandiser. The only guarantee in medicine is that nothing is guaranteed. It is important to note that the decision to proceed with this intervention was based on the information collected from the patient. The Data and conclusions were drawn from the patient's questionnaire, the interview, and the physical examination. Because the information  was provided in large part by the patient, it cannot be  guaranteed that it has not been purposely or unconsciously manipulated. Every effort has been made to obtain as much relevant data as possible for this evaluation. It is important to note that the conclusions that lead to this procedure are derived in large part from the available data. Always take into account that the treatment will also be dependent on availability of resources and existing treatment guidelines, considered by other Pain Management Practitioners as being common knowledge and practice, at the time of the intervention. For Medico-Legal purposes, it is also important to point out that variation in procedural techniques and pharmacological choices are the acceptable norm. The indications, contraindications, technique, and results of the above procedure should only be interpreted and judged by a Board-Certified Interventional Pain Specialist with extensive familiarity and expertise in the same exact procedure and technique.

## 2023-06-28 ENCOUNTER — Telehealth: Payer: Self-pay

## 2023-06-28 NOTE — Telephone Encounter (Signed)
 Attempt to contact patient for post-procedure follow-up. No answer and left voicemail message.

## 2023-07-09 NOTE — Progress Notes (Signed)
 PROVIDER NOTE: Interpretation of information contained herein should be left to medically-trained personnel. Specific patient instructions are provided elsewhere under "Patient Instructions" section of medical record. This document was created in part using AI and STT-dictation technology, any transcriptional errors that may result from this process are unintentional.  Patient: Adrian Kim  Service: E/M   [facility]P: [facility]  DOB: [birth year]  DOS: 07/10/2023  Provider: Candi Chafe, MD  MRN: [id number]  Delivery: Face-to-face  Specialty: Interventional Pain Management  Type: Established Patient  Setting: Ambulatory outpatient facility  Specialty designation: 09  Referring Prov.: [facility]  Location: Outpatient office facility       HPI  Adrian Kim, a 83 y.o. year old male, is here today because of his Chronic pain of right knee [M25.561, G89.29]. Adrian Kim. Adrian Kim primary complain today is Knee Pain (Right knee pain when ambulating)  Pertinent problems: 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; Chronic lower extremity pain (Bilateral) (R>L); Trigger ring finger of left hand; Chronic feet pain (Bilateral); Rib pain (Bilateral); Generalized osteoarthritis of multiple sites; Osteoarthritis of feet (Bilateral); Osteoarthritis of knees (Bilateral) (R>L); Osteoarthritis of hips (Bilateral); Vertigo; Difficulty in walking; DDD (degenerative disc disease), cervical; Osteoarthritis of facet joint of cervical spine; Cervical facet hypertrophy; Grade 1 Retrolisthesis of C3/C4; Osteoarthritis of shoulder (Right); Osteoarthritis of glenohumeral joint (Right); DDD (degenerative disc disease), lumbosacral; Lumbar facet arthropathy (L3-4, L5-S1); Lumbar facet hypertrophy; Osteoarthritis of  facet joint of lumbar spine; Tricompartment osteoarthritis of knee (Right); Osteoarthritis of hands (Bilateral); Chronic low back pain (Bilateral) w/ sciatica (Bilateral); Diabetic peripheral neuropathy (HCC); Chronic painful diabetic neuropathy (HCC); Abnormal MRI, knee (08/12/2022) (Right); Osteoarthritis of knee (Right); Complex tear of medial meniscus of knee, sequela (Right); Complex tear of lateral meniscus of knee, sequela (Right); Baker's cyst of knee (Right); Effusion of knee joint (Right); Chronic knee pain (Right); Transient synovitis of knee (Right); Burning pain; Pes anserine bursitis (Left); Medial knee pain (Left); Chronic knee pain (Left); Osteoarthritis of knee (Left); Complaints of weakness of lower extremity; Abnormal MRI, lumbar spine (01/26/2023); and Spinal stenosis, lumbar region, 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 Pain Assessment: Severity of Chronic pain is reported as a 8 /10. Location: Knee Right/radiates down both legs to feet-difficult for pt to express or remember. Onset: More than a month ago. Quality: Aching (only when ambulating). Timing: Intermittent. Modifying factor(s): sitting down. Vitals:  height is 5\' 8"  (1.727 m) and weight is 180 lb (81.6 kg). His temperature is 99.9 F (37.7 C). His blood pressure is 102/61 and his pulse is 86. His oxygen saturation is 99%.  BMI: Estimated body mass index is 27.37 kg/m as calculated from the following:   Height as of this encounter: 5\' 8"  (1.727 m).   Weight as of this encounter: 180 lb (81.6 kg). Last encounter: 05/23/2023. Last procedure: 06/27/2023.  Reason for encounter: post-procedure evaluation and assessment.   Discussed the use of AI scribe software for clinical note transcription with the patient, who gave verbal consent to proceed.  History of Present Illness   Adrian Kim is an 83 year old male with spinal stenosis who presents with persistent right leg pain.  He experiences  persistent right leg pain that primarily occurs when walking or standing for extended periods. The pain radiates down the leg and is alleviated  by sitting. It is more pronounced on the right side, especially when leaning to the right.  He previously underwent a lumbar epidural and a knee injection on the right side, but these interventions have not provided significant relief. The pain continues to impact his mobility, causing difficulty with walking and standing.  An MRI conducted in December 2024 revealed arthritis with severe central canal stenosis at the L2-3 level and foraminal narrowing at T12-L1 and L3-4.  He has been evaluated by a neurologist for neuropathy, but has not yet seen a neurosurgeon for further assessment of his spinal stenosis.      Post-procedure evaluation   Procedure No.1: Lumbar epidural steroid injection (LESI) (interlaminar) #1    Laterality: Right   Level:  L2-3 Level.  Imaging: Fluoroscopic guidance Spinal ([id number]) Anesthesia: Local anesthesia (1-2% Lidocaine ) Anxiolysis: IV Versed          Sedation: No Sedation                       DOS: 05/07/2023  Performed by: Candi Chafe, MD  Purpose: Diagnostic/Therapeutic Indications: Lumbar radicular pain of intraspinal etiology of more than 4 weeks that has failed to respond to conservative therapy and is severe enough to impact quality of life or function. 1. Chronic lower extremity pain (Bilateral) (R>L)   2. Chronic low back pain (Bilateral) w/ sciatica (Bilateral)   3. 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 Complaints of weakness of lower extremity   5. Degeneration of intervertebral disc of lumbosacral region with discogenic back pain and lower extremity pain   6. Chronic knee pain (1ry area of Pain) (Bilateral) (R>L)    Procedure No.2:  Steroid Intra-articular Knee Injection #2  Laterality: Right (-RT) Level/approach: Medial Imaging guidance: None required  ([id number]) Anesthesia: Local anesthesia (1-2% Lidocaine )  Purpose: Diagnostic/Therapeutic Indications: Knee arthralgia associated to osteoarthritis of the knee 1. Chronic knee pain (Right)   2. Osteoarthritis of knee (Right)   3. Tricompartment osteoarthritis of knee (Right)   4. Complex tear of lateral meniscus of knee, sequela (Right)   5. Complex tear of medial meniscus of knee, sequela (Right)   6. Effusion of knee joint (Right)   7. Transient synovitis of knee (Right)   8. Baker's cyst of knee (Right)   9. Chronic knee pain (1ry area of Pain) (Bilateral) (R>L)   10. Osteoarthritis of knees (Bilateral) (R>L)    NAS-11 Pain score:   Pre-procedure: 10-Worst pain ever/10   Post-procedure: 0-No pain/10   Effectiveness:  Initial hour after procedure:  (pt unable to remember or express). Subsequent 4-6 hours post-procedure:  (pt unable to remember or express). Analgesia past initial 6 hours:  (pt unable to express or remember). 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  In any case we were able to assess that his pain is primarily when he stands up but has not gotten any better.  His recent MRI of the lumbar spine shows severe central spinal stenosis which we will be sending him to the neurosurgeons to evaluate for possible decompression. Function: No improvement ROM: No improvement   Pharmacotherapy Assessment  Analgesic: No chronic opioid analgesics therapy prescribed by our practice. None MME/day: 0 mg/day   Monitoring: [place] PMP: PDMP not reviewed this encounter.  Pharmacotherapy: No side-effects or adverse reactions reported. Compliance: No problems identified. Effectiveness: Clinically acceptable.  Merilyn Staple, RN  07/10/2023  1:25 PM  Sign when Signing Visit Safety  precautions to be maintained throughout the outpatient stay will include: orient to surroundings, keep bed in low position, maintain call bell within reach at all times, provide assistance with transfer out of bed and ambulation.     No results found for: "CBDTHCR" No results found for: "D8THCCBX" No results found for: "D9THCCBX"  UDS:  Summary  Date Value Ref Range Status  04/09/2022 Note  Final    Comment:    ==================================================================== Compliance Drug Analysis, Ur ==================================================================== Test                             Result       Flag       Units  Drug Present not Declared for Prescription Verification   Alcohol, Ethyl                 0.048        UNEXPECTED g/dL    Sources of ethyl alcohol include alcoholic beverages or as a    fermentation product of glucose; glucose is present in this specimen.    Interpret result with caution, as the presence of ethyl alcohol is    likely due, at least in part, to fermentation of glucose.  Drug Absent but Declared for Prescription Verification   Acetaminophen                   Not Detected UNEXPECTED    Acetaminophen , as indicated in the declared medication list, is not    always detected even when used as directed.    Salicylate                     Not Detected UNEXPECTED    Aspirin, as indicated in the declared medication list, is not always    detected even when used as directed.    Ibuprofen                       Not Detected UNEXPECTED    Ibuprofen , as indicated in the declared medication list, is not    always detected even when used as directed.  ==================================================================== Test                      Result    Flag   Units      Ref Range   Creatinine              39               mg/dL      >=65 ==================================================================== Declared Medications:  The flagging and  interpretation on this report are based on the  following declared medications.  Unexpected results may arise from  inaccuracies in the declared medications.   **Note: The testing scope of this panel does not include small to  moderate amounts of these reported medications:   Acetaminophen  (Tylenol )  Aspirin  Ibuprofen    **Note: The testing scope of this panel does not include the  following reported medications:   Betamethasone   Canagliflozin (Invokana)  Cetirizine (Zyrtec)  Dorzolamide  Fluticasone  Latanoprost (Xalatan)  Linagliptin (Tradjenta)  Lisinopril (Zestril)  Metformin  Rosuvastatin (Crestor)  Semaglutide (Rybelsus)  Sitagliptin (Januvia)  Tamsulosin  (Flomax )  Timolol  (Timoptic ) ==================================================================== For clinical  consultation, please call [phone]. ====================================================================       ROS  Constitutional: Denies any fever or chills Gastrointestinal: No reported hemesis, hematochezia, vomiting, or acute GI distress Musculoskeletal: Denies any acute onset joint swelling, redness, loss of ROM, or weakness Neurological: No reported episodes of acute onset apraxia, aphasia, dysarthria, agnosia, amnesia, paralysis, loss of coordination, or loss of consciousness  Medication Review  Semaglutide, TRUEplus Lancets 28G, Vitamin B-12, acetaminophen , aspirin EC, canagliflozin, cetirizine, dorzolamide-timolol , fluticasone, glucose blood, ibuprofen , latanoprost, linagliptin, lisinopril, meclizine , metFORMIN, rosuvastatin, sitaGLIPtin, tamsulosin , and timolol   History Review  Allergy: Adrian Kim is allergic to zolpidem. Drug: Adrian Kim  reports no history of drug use. Alcohol:  reports no history of alcohol use. Tobacco:  reports that he has quit smoking. He has never used smokeless tobacco. Social: Adrian Kim  reports that he has quit smoking. He has never used smokeless  tobacco. He reports that he does not drink alcohol and does not use drugs. Medical:  has a past medical history of Arthritis, Diabetes (HCC), Glaucoma, Heart attack (HCC), HLD (hyperlipidemia), HTN (hypertension), Lower back pain, Sleep apnea, and Tinnitus. Surgical: Adrian Kim  has a past surgical history that includes Coronary angioplasty with stent; Colonoscopy with propofol  (N/A, 12/10/2014); and Cataract extraction w/PHACO (Right, 11/16/2015). Family: family history includes Leukemia in his son.  Laboratory Chemistry Profile   Renal Lab Results  Component Value Date   BUN 9 01/26/2023   CREATININE 0.65 01/26/2023   GFRNONAA >60 01/26/2023    Hepatic Lab Results  Component Value Date   AST 16 04/09/2022   ALT 12 04/09/2022   ALBUMIN 4.4 04/09/2022   ALKPHOS 57 04/09/2022    Electrolytes Lab Results  Component Value Date   NA 137 01/26/2023   K 3.8 01/26/2023   CL 101 01/26/2023   CALCIUM 9.1 01/26/2023   MG 2.1 04/09/2022    Bone Lab Results  Component Value Date   25OHVITD1 6.8 (L) 04/09/2022   25OHVITD2 <1.0 04/09/2022   25OHVITD3 6.8 04/09/2022    Inflammation (CRP: Acute Phase) (ESR: Chronic Phase) Lab Results  Component Value Date   CRP 0.5 04/09/2022   ESRSEDRATE 4 04/09/2022         Note: Above Lab results reviewed.  Recent Imaging Review  DG PAIN CLINIC C-ARM 1-60 MIN NO REPORT Fluoro was used, but no Radiologist interpretation will be provided.  Please refer to "NOTES" tab for provider progress note. Note: Reviewed         Physical Exam  General appearance: Well nourished, well developed, and well hydrated. In no apparent acute distress Mental status: Alert, oriented x 3 (person, place, & time)       Respiratory: No evidence of acute respiratory distress Eyes: PERLA Vitals: BP 102/61   Pulse 86   Temp 99.9 F (37.7 C)   Ht 5\' 8"  (1.727 m)   Wt 180 lb (81.6 kg)   SpO2 99%   BMI 27.37 kg/m  BMI: Estimated body mass index is 27.37 kg/m as  calculated from the following:   Height as of this encounter: 5\' 8"  (1.727 m).   Weight as of this encounter: 180 lb (81.6 kg). Ideal: Ideal body weight: 68.4 kg (150 lb 12.7 oz) Adjusted ideal body weight: 73.7 kg (162 lb 7.6 oz)  Assessment   Diagnosis Status  1. Chronic knee pain (Right)   2. Chronic lower extremity pain (Bilateral) (R>L)   3. Chronic low back pain (Bilateral) w/ sciatica (Bilateral)   4. Chronic knee  pain (1ry area of Pain) (Bilateral) (R>L)   5. Postop check   6. Abnormal MRI, lumbar spine (01/26/2023)    Controlled Controlled Controlled   Updated Problems: No problems updated.  Plan of Care  Problem-specific:  Assessment and Plan    Spinal stenosis, lumbar region   Severe spinal stenosis at the L2-3 level causes pain and numbness in the legs, worsened by standing and walking, and relieved by sitting. A previous lumbar epidural steroid injection provided insufficient relief, indicating a mechanical issue that requires surgical intervention. Decompression surgery by a neurosurgeon is recommended to alleviate nerve compression and potentially improve leg strength and mobility. Refer to a neurosurgeon for evaluation and potential decompression surgery. Send relevant medical notes and imaging to the neurosurgeon.  Arthritis of lumbar spine   Arthritis with severe central canal stenosis at L2-3 and foraminal narrowing at T12-L1 and L3-4 contributes to spinal stenosis symptoms.  Peripheral neuropathy   Peripheral neuropathy, previously evaluated by neurology, contributes to symptoms in the feet.       Adrian Kim has a current medication list which includes the following long-term medication(s): cetirizine, vitamin b-12, fluticasone, linagliptin, lisinopril, metformin, rosuvastatin, and sitagliptin.  Pharmacotherapy (Medications Ordered): No orders of the defined types were placed in this encounter.  Orders:  Orders Placed This Encounter  Procedures    Ambulatory referral to Neurosurgery    Referral Priority:   Routine    Referral Type:   Surgical    Referral Reason:   Specialty Services Required    Requested Specialty:   Neurosurgery    Number of Visits Requested:   1   Nursing Instructions:    Please complete this patient's postprocedure evaluation.    Scheduling Instructions:     Please complete this patient's postprocedure evaluation.   Follow-up plan:   Return if symptoms worsen or fail to improve.     Interventional Therapies  Risk Factors  Considerations:  Language barrier: [place] (interpreter required)  IDDM  CAD  Hx. MI  (B) Carotid A. Stenosis  HTN  AoV stenosis  Mitral & Tricuspid V. insufficiency  OSA   Planned  Pending:  Diagnostic/therapeutic right genicular NB #1 (PRN)    Under consideration:   Diagnostic/therapeutic right genicular NB #1  Possible right genicular RFA #1    Completed:   Diagnostic/therapeutic midline-right L2-3 LESI x1 (05/07/2023) (100/100/100/100)  Therapeutic right IA steroid knee inj. (medial approach) x2 (05/07/2023) (100/100/100/100)  Therapeutic left pes anserine bursa steroid knee inj. x1  Therapeutic bilateral IA Zilretta  knee injection x1 (11/06/2022) (100/100/75/75) Therapeutic bilateral lower extremity Qutenza  Tx x1 (11/06/2022) (50/50/75/75) Diagnostic/therapeutic bilateral IA steroid knee inj. x1 (05/15/2022) (9-0/10) (100/100/95-100)  (10/10/2022) Referral to orthopedic surgery   Completed by other providers:      Therapeutic  Palliative (PRN) options:   None established     Recent Visits Date Type Provider Dept  06/27/23 Procedure visit Renaldo Caroli, MD [facility]  05/23/23 Office Visit Renaldo Caroli, MD [facility]  05/07/23 Procedure visit Renaldo Caroli, MD [facility]  04/29/23 Office Visit Renaldo Caroli, MD [facility]  Showing recent visits within past 90 days and meeting all other  requirements Today's Visits Date Type Provider Dept  07/10/23 Office Visit Renaldo Caroli, MD [facility]  Showing today's visits and meeting all other requirements Future Appointments No visits were found meeting these conditions. Showing future appointments within next 90 days and meeting all other requirements   I discussed the assessment and treatment  plan with the patient. The patient was provided an opportunity to ask questions and all were answered. The patient agreed with the plan and demonstrated an understanding of the instructions.  Patient advised to call back or seek an in-person evaluation if the symptoms or condition worsens.  Duration of encounter: 30 minutes.  Total time on encounter, as per AMA guidelines included both the face-to-face and non-face-to-face time personally spent by the physician and/or other qualified health care professional(s) on the day of the encounter (includes time in activities that require the physician or other qualified health care professional and does not include time in activities normally performed by clinical staff). Physician's time may include the following activities when performed: Preparing to see the patient (e.g., pre-charting review of records, searching for previously ordered imaging, lab work, and nerve conduction tests) Review of prior analgesic pharmacotherapies. Reviewing PMP Interpreting ordered tests (e.g., lab work, imaging, nerve conduction tests) Performing post-procedure evaluations, including interpretation of diagnostic procedures Obtaining and/or reviewing separately obtained history Performing a medically appropriate examination and/or evaluation Counseling and educating the patient/family/caregiver Ordering medications, tests, or procedures Referring and communicating with other health care professionals (when not separately reported) Documenting clinical information in the electronic or other health  record Independently interpreting results (not separately reported) and communicating results to the patient/ family/caregiver Care coordination (not separately reported)  Note by: Candi Chafe, MD (TTS and AI technology used. I apologize for any typographical errors that were not detected and corrected.) Date: 07/10/2023; Time: 2:13 PM

## 2023-07-10 ENCOUNTER — Encounter: Payer: Self-pay | Admitting: Pain Medicine

## 2023-07-10 ENCOUNTER — Ambulatory Visit: Attending: Pain Medicine | Admitting: Pain Medicine

## 2023-07-10 VITALS — BP 102/61 | HR 86 | Temp 99.9°F | Ht 68.0 in | Wt 180.0 lb

## 2023-07-10 DIAGNOSIS — R937 Abnormal findings on diagnostic imaging of other parts of musculoskeletal system: Secondary | ICD-10-CM | POA: Diagnosis present

## 2023-07-10 DIAGNOSIS — M79605 Pain in left leg: Secondary | ICD-10-CM | POA: Diagnosis present

## 2023-07-10 DIAGNOSIS — G8929 Other chronic pain: Secondary | ICD-10-CM | POA: Insufficient documentation

## 2023-07-10 DIAGNOSIS — Z09 Encounter for follow-up examination after completed treatment for conditions other than malignant neoplasm: Secondary | ICD-10-CM | POA: Diagnosis present

## 2023-07-10 DIAGNOSIS — M25561 Pain in right knee: Secondary | ICD-10-CM | POA: Insufficient documentation

## 2023-07-10 DIAGNOSIS — M79604 Pain in right leg: Secondary | ICD-10-CM | POA: Diagnosis present

## 2023-07-10 DIAGNOSIS — M5441 Lumbago with sciatica, right side: Secondary | ICD-10-CM | POA: Insufficient documentation

## 2023-07-10 DIAGNOSIS — M5442 Lumbago with sciatica, left side: Secondary | ICD-10-CM | POA: Insufficient documentation

## 2023-07-10 DIAGNOSIS — M25562 Pain in left knee: Secondary | ICD-10-CM | POA: Diagnosis present

## 2023-07-10 NOTE — Progress Notes (Signed)
 Safety precautions to be maintained throughout the outpatient stay will include: orient to surroundings, keep bed in low position, maintain call bell within reach at all times, provide assistance with transfer out of bed and ambulation.

## 2023-07-30 NOTE — Progress Notes (Unsigned)
 Referring Physician:  Renaldo Caroli, MD [address] [place],  [place] [address]  Primary Physician:  [facility]  IPAD used as interpreter. He speaks [place].   History of Present Illness: 07/31/2023 [name] has a history of HTN, CAD, heart valvular disease, MI, DM, diabetic peripheral neuropathy, dementia, chronic pain, mixed hyperlipidemia.   He is a poor historian. Son helps with history.   He has intermittent LBP with bilateral posterior leg pain to his feet that is worse with standing and walking, right leg > left.  He has minimal pain with sitting. He has numbness and weakness in his legs.   No relief with lumbar ESI in March by Dr. Barth Borne.   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   He does not smoke.   Bowel/Bladder Dysfunction: none  Conservative measures:  Physical therapy: none Multimodal medical therapy including regular antiinflammatories:  tylenol , ibuprofen  Injections:   05/07/23: Right L2-3 ESI (Dr. Barth Borne)  Past Surgery: no spinal surgeries   The symptoms are causing a significant impact on the patient's life.   Review of Systems:  A 10 point review of systems is negative, except for the pertinent positives and negatives detailed in the HPI.  Past Medical History: Past Medical History:  Diagnosis Date   Arthritis    Diabetes (HCC)    Glaucoma    Heart attack (HCC)    HLD (hyperlipidemia)    HTN (hypertension)    Lower back pain    Sleep apnea    Tinnitus     Past Surgical History: Past Surgical History:  Procedure Laterality Date   CATARACT EXTRACTION W/PHACO Right 11/16/2015   Procedure: CATARACT EXTRACTION PHACO AND INTRAOCULAR LENS PLACEMENT (IOC);  Surgeon: Annell Kidney, MD;  Location: [facility];  Service: Ophthalmology;  Laterality: Right;  DIABETIC - oral meds NEEDS Greek INTERPRETER   COLONOSCOPY WITH PROPOFOL   N/A 12/10/2014   Procedure: COLONOSCOPY WITH PROPOFOL ;  Surgeon: Stephens Eis, MD;  Location: [facility];  Service: Gastroenterology;  Laterality: N/A;   CORONARY ANGIOPLASTY WITH STENT PLACEMENT      Allergies: Allergies as of 07/31/2023 - Review Complete 07/31/2023  Allergen Reaction Noted   Zolpidem  09/14/2014    Medications: Outpatient Encounter Medications as of 07/31/2023  Medication Sig   acetaminophen  (TYLENOL ) 325 MG tablet Take 650 mg by mouth every 6 (six) hours as needed.   aspirin EC 81 MG tablet Take by mouth.   cetirizine (ZYRTEC) 5 MG chewable tablet Chew 5 mg by mouth daily.   dorzolamide-timolol  (COSOPT) 2-0.5 % ophthalmic solution 1 drop 2 (two) times daily.   fluticasone (FLONASE) 50 MCG/ACT nasal spray    glucose blood test strip    ibuprofen  (ADVIL ) 400 MG tablet Take 1 tablet (400 mg total) by mouth every 6 (six) hours as needed.   INVOKANA 300 MG TABS tablet Take 300 mg by mouth daily.   latanoprost (XALATAN) 0.005 % ophthalmic solution    linagliptin (TRADJENTA) 5 MG TABS tablet Take 5 mg by mouth daily.   lisinopril (PRINIVIL,ZESTRIL) 2.5 MG tablet    meclizine  (ANTIVERT ) 12.5 MG tablet Take 1 tablet (12.5 mg total) by mouth 3 (three) times daily as needed for dizziness.   metFORMIN (GLUCOPHAGE-XR) 500 MG 24 hr tablet    rosuvastatin (CRESTOR) 20 MG tablet Take 20 mg by mouth at bedtime.   RYBELSUS 7 MG TABS Take 1 tablet by mouth daily.  sitaGLIPtin (JANUVIA) 25 MG tablet Take 25 mg by mouth daily.   tamsulosin  (FLOMAX ) 0.4 MG CAPS capsule Take 0.4 mg by mouth.   timolol  (TIMOPTIC ) 0.5 % ophthalmic solution    TRUEplus Lancets 28G MISC    Cyanocobalamin  (VITAMIN B-12) 5000 MCG SUBL Place 1 tablet (5,000 mcg total) under the tongue daily.   No facility-administered encounter medications on file as of 07/31/2023.    Social History: Social History   Tobacco Use   Smoking status: Former   Smokeless tobacco: Never   Tobacco comments:    Quit 11  years ago  Substance Use Topics   Alcohol use: No    Alcohol/week: 0.0 standard drinks of alcohol   Drug use: No    Family Medical History: Family History  Problem Relation Age of Onset   Leukemia Son    Kidney disease Neg Hx    Prostate cancer Neg Hx     Physical Examination: Vitals:   07/31/23 1422  BP: 103/62    General: Patient is well developed, well nourished, calm, collected, and in no apparent distress. Attention to examination is appropriate.  Respiratory: Patient is breathing without any difficulty.   NEUROLOGICAL:     Awake, alert, oriented to person, place, and time.  Speech is clear and fluent. Fund of knowledge is appropriate.   Cranial Nerves: Pupils equal round and reactive to light.  Facial tone is symmetric.    No lower lumbar tenderness.   No abnormal lesions on exposed skin.   Strength: Side Biceps Triceps Deltoid Interossei Grip Wrist Ext. Wrist Flex.  R 5 5 5 5 5 5 5   L 5 5 5 5 5 5 5    Side Iliopsoas Quads Hamstring PF DF EHL  R 5 5 5 5 5 5   L 5 5 5 5 5 5    He has no gross weakness, but has difficulty following commands.   Reflexes are 1+ and symmetric at the biceps, brachioradialis, patella and achilles.   Hoffman's is absent.   Clonus is not present.    Bilateral upper and lower extremity sensation is intact to light touch.     Gait not tested. He is in a WC.    Medical Decision Making  Imaging: Lumbar MRI dated 01/06/23:  FINDINGS: Segmentation:  Standard.   Alignment:  Mild convex left curvature is unchanged.  No listhesis.   Vertebrae:  No fracture, evidence of discitis, or bone lesion.   Conus medullaris and cauda equina: Conus extends to the L1 level. Conus and cauda equina appear normal.   Paraspinal and other soft tissues: Negative.   Disc levels:   T11-12 is imaged in the sagittal plane only and negative.   T12-L1: A right lateral recess and foraminal protrusion causes mild to moderate narrowing in the lateral  recess and foramen. The left foramen is open.   L1-2: Shallow disc bulge with endplate spurring. Mild narrowing in the right lateral recess and foramen. The left foramen is open.   L2-3: There is loss of disc space height with a broad-based bulge and endplate spur. Ligamentum flavum thickening is present. Moderately severe central canal stenosis is seen. The foramina are open.   L3-4: Loss of disc space height with a shallow bulge and endplate spur. There is mild-to-moderate bilateral facet degenerative change. Mild central canal and mild to moderate right foraminal narrowing are seen. The left foramen is open.   L4-5: There is a disc bulge with endplate spur, ligamentum flavum thickening and mild  facet arthropathy. There is mild to moderate central canal and left foraminal stenosis. The right foramen is open.   L5-S1: Loss of disc space height with a shallow bulge and endplate spur. Moderate left foraminal stenosis is present. The central canal and right foramen are open.   IMPRESSION: 1. Multilevel spondylosis appears worst at L2-3 where there is moderately severe central canal stenosis. 2. Mild to moderate right foraminal narrowing at T12-L1 and L3-4. 3. Mild right lateral recess and foraminal narrowing at L1-2. 4. Mild to moderate central canal and left foraminal stenosis at L4-5. 5. Moderate left foraminal stenosis L5-S1.     Electronically Signed   By: Etheleen Her M.D.   On: 01/26/2023 10:36   I have personally reviewed the images and agree with the above interpretation.  Assessment and Plan: Mr. Clippinger is a poor historian. Son helps with history.   He has intermittent LBP with bilateral posterior leg pain to his feet that is worse with standing and walking, right leg > left. Pain improves after sitting for a few minutes. He has numbness and weakness in his legs.   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   Primary pain appears to be from spinal stenosis.   Treatment options discussed with patient and following plan made:  - Recommend PT for lumbar spine. Orders to [facility]. He will need to call to schedule. Interpreter requested.  - Discussed that he may be a candidate for surgery, but would need to complete 6 weeks of PT or be discharged.  - He will follow up with me in 6 weeks to regroup. If no improvement, will get updated MRI and have him see one of the surgeons.  - He has known dementia. Son states they would be interested in surgery discussing surgery options.   I spent a total of 45 minutes in face-to-face and non-face-to-face activities related to this patient's care today including review of outside records, review of imaging, review of symptoms, physical exam, discussion of differential diagnosis, discussion of treatment options, and documentation.   Thank you for involving me in the care of this patient.   Lucetta Russel PA-C Dept. of Neurosurgery

## 2023-07-31 ENCOUNTER — Encounter: Payer: Self-pay | Admitting: Orthopedic Surgery

## 2023-07-31 ENCOUNTER — Ambulatory Visit: Admitting: Orthopedic Surgery

## 2023-07-31 VITALS — BP 103/62 | Ht 68.0 in | Wt 180.0 lb

## 2023-07-31 DIAGNOSIS — M48062 Spinal stenosis, lumbar region with neurogenic claudication: Secondary | ICD-10-CM | POA: Diagnosis not present

## 2023-07-31 DIAGNOSIS — M4726 Other spondylosis with radiculopathy, lumbar region: Secondary | ICD-10-CM

## 2023-07-31 DIAGNOSIS — M51362 Other intervertebral disc degeneration, lumbar region with discogenic back pain and lower extremity pain: Secondary | ICD-10-CM | POA: Diagnosis not present

## 2023-07-31 DIAGNOSIS — M47816 Spondylosis without myelopathy or radiculopathy, lumbar region: Secondary | ICD-10-CM

## 2023-07-31 DIAGNOSIS — M5416 Radiculopathy, lumbar region: Secondary | ICD-10-CM

## 2023-07-31 NOTE — Patient Instructions (Signed)
 It was so nice to see you today. Thank you so much for coming in.    You have spinal stenosis (pressure on the spinal cord) in your lower back and this is likely causing your back and leg pain.   I sent physical therapy orders to [facility]. You will have to call them to schedule your visit. See contact information below.   Let me know if PT discharges you.   If no improvement with PT, will likely update your MRI and have you see one of the surgeons.   I will see you back in 6 weeks. Please do not hesitate to call if you have any questions or concerns. You can also message me in MyChart.   Adrian Russel PA-C [phone]     The physicians and staff at [facility] at [facility] are committed to providing excellent care. You may receive a survey asking for feedback about your experience at our office. We value you your feedback and appreciate you taking the time to to fill it out. The [facility] leadership team is also available to discuss your experience in person, feel free to contact us  [phone].

## 2023-08-19 ENCOUNTER — Ambulatory Visit: Attending: Orthopedic Surgery | Admitting: Physical Therapy

## 2023-08-19 DIAGNOSIS — R296 Repeated falls: Secondary | ICD-10-CM | POA: Diagnosis present

## 2023-08-19 DIAGNOSIS — M5416 Radiculopathy, lumbar region: Secondary | ICD-10-CM | POA: Diagnosis not present

## 2023-08-19 DIAGNOSIS — M48062 Spinal stenosis, lumbar region with neurogenic claudication: Secondary | ICD-10-CM | POA: Insufficient documentation

## 2023-08-19 DIAGNOSIS — M47816 Spondylosis without myelopathy or radiculopathy, lumbar region: Secondary | ICD-10-CM | POA: Diagnosis not present

## 2023-08-19 DIAGNOSIS — R262 Difficulty in walking, not elsewhere classified: Secondary | ICD-10-CM | POA: Diagnosis present

## 2023-08-19 DIAGNOSIS — M5459 Other low back pain: Secondary | ICD-10-CM | POA: Insufficient documentation

## 2023-08-19 NOTE — Therapy (Signed)
 OUTPATIENT PHYSICAL THERAPY THORACOLUMBAR EVALUATION   Patient Name: Adrian Kim MRN: [id number] DOB:Dec 01, 1940, 83 y.o., male Today's Date: 08/19/2023  END OF SESSION:  PT End of Session - 08/19/23 1429     Visit Number 1    Number of Visits 24    Date for PT Re-Evaluation 10/28/23    Authorization Type Medicare 2025    Authorization - Visit Number 1    Authorization - Number of Visits 24    Progress Note Due on Visit 10    PT Start Time 0945    PT Stop Time 1030    PT Time Calculation (min) 45 min    Equipment Utilized During Treatment Gait belt;Other (comment)   2 wheeled walker   Activity Tolerance Patient limited by pain    Behavior During Therapy WFL for tasks assessed/performed          Past Medical History:  Diagnosis Date   Arthritis    Diabetes (HCC)    Glaucoma    Heart attack (HCC)    HLD (hyperlipidemia)    HTN (hypertension)    Lower back pain    Sleep apnea    Tinnitus    Past Surgical History:  Procedure Laterality Date   CATARACT EXTRACTION W/PHACO Right 11/16/2015   Procedure: CATARACT EXTRACTION PHACO AND INTRAOCULAR LENS PLACEMENT (IOC);  Surgeon: Dene Etienne, MD;  Location: [facility];  Service: Ophthalmology;  Laterality: Right;  DIABETIC - oral meds NEEDS Greek INTERPRETER   COLONOSCOPY WITH PROPOFOL  N/A 12/10/2014   Procedure: COLONOSCOPY WITH PROPOFOL ;  Surgeon: Deward CINDERELLA Piedmont, MD;  Location: [facility];  Service: Gastroenterology;  Laterality: N/A;   CORONARY ANGIOPLASTY WITH STENT PLACEMENT     Patient Active Problem List   Diagnosis Date Noted   Spinal stenosis, lumbar region, 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) 04/29/2023   Abnormal MRI, lumbar spine (01/26/2023) 01/28/2023   Complaints of weakness of lower extremity 01/03/2023   Language barrier 12/13/2022   Pes anserine bursitis (Left) 11/22/2022   Medial knee pain (Left) 11/22/2022   Chronic knee pain (Left) 11/22/2022   Osteoarthritis of knee (Left)  11/22/2022   Abnormal MRI, knee (08/12/2022) (Right) 10/10/2022   Osteoarthritis of knee (Right) 10/10/2022   Complex tear of medial meniscus of knee, sequela (Right) 10/10/2022   Complex tear of lateral meniscus of knee, sequela (Right) 10/10/2022   Baker's cyst of knee (Right) 10/10/2022   Effusion of knee joint (Right) 10/10/2022   Chronic knee pain (Right) 10/10/2022   Transient synovitis of knee (Right) 10/10/2022   Burning pain 10/10/2022   Diabetic peripheral neuropathy (HCC) 07/26/2022   Chronic painful diabetic neuropathy (HCC) 07/26/2022   Chronic low back pain (Bilateral) w/ sciatica (Bilateral) 05/31/2022   Generalized osteoarthritis of multiple sites 05/07/2022   Osteoarthritis of feet (Bilateral) 05/07/2022   Osteoarthritis of knees (Bilateral) (R>L) 05/07/2022   Osteoarthritis of hips (Bilateral) 05/07/2022   Balance problem 05/07/2022   Vertigo 05/07/2022   Vitamin D  deficiency 05/07/2022   Difficulty in walking 05/07/2022   DDD (degenerative disc disease), cervical 05/07/2022   Osteoarthritis of facet joint of cervical spine 05/07/2022   Cervical facet hypertrophy 05/07/2022   Grade 1 Retrolisthesis of C3/C4 05/07/2022   Osteoarthritis of shoulder (Right) 05/07/2022   Osteoarthritis of glenohumeral joint (Right) 05/07/2022   DDD (degenerative disc disease), lumbosacral 05/07/2022   Lumbar facet arthropathy (L3-4, L5-S1) 05/07/2022   Aortic atherosclerosis (HCC) 05/07/2022   Lumbar facet hypertrophy 05/07/2022   Osteoarthritis of facet joint of lumbar spine  05/07/2022   Tricompartment osteoarthritis of knee (Right) 05/07/2022   Osteoarthritis of hands (Bilateral) 05/07/2022   Chronic pain syndrome 04/09/2022   Pharmacologic therapy 04/09/2022   Disorder of skeletal system 04/09/2022   Problems influencing health status 04/09/2022   Non-insulin dependent type 2 diabetes mellitus (HCC) 04/09/2022   Chronic hand pain (Bilateral) 04/09/2022   Chronic knee pain (1ry  area of Pain) (Bilateral) (R>L) 04/09/2022   Chronic hip pain (Bilateral) 04/09/2022   Chronic neck pain 04/09/2022   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 04/09/2022   Chronic lower extremity pain (Bilateral) (R>L) 04/09/2022   Trigger ring finger of left hand 04/09/2022   Chronic feet pain (Bilateral) 04/09/2022   Rib pain (Bilateral) 04/09/2022   Mild cognitive impairment 04/09/2022   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 Glade Boys    REFERRING DIAG:  [phone] (ICD-10-CM) - Lumbar spondylosis  M54.16 (ICD-10-CM) - Lumbar radiculopathy  M48.062 (ICD-10-CM) - Spinal stenosis, lumbar region, with neurogenic claudication    Rationale for Evaluation and Treatment: Rehabilitation  THERAPY DIAG:  Other low back pain  ONSET DATE: 2024    SUBJECTIVE:                                                                                                                                                                                           SUBJECTIVE STATEMENT: See pertinent history   PERTINENT HISTORY:  He is accompanied by his son who serves as his interpreter. Patient is hard of hearing.  Pt reports increased right sided low back pain especially when getting up to walk. This started to happen about a year ago. He experiences a lot of the pain in the medial joint line of his right knee and he  has received three shots in the past. He has had repeated falls in the past year and the most recent one resulted in a fall on his left side on to his shoulder.   PAIN:  Are you having pain? Yes: NPRS scale: 10/10 worst   Pain location: Knee down to right foot on anterior  surface  Pain description: Achy  Aggravating factors: Walking or standing. Relieving factors:    PRECAUTIONS: Fall  RED FLAGS: None   WEIGHT BEARING RESTRICTIONS: No  FALLS:  Has patient fallen in last 6 months? Yes. Number of falls 2-3; turning to quickly     LIVING ENVIRONMENT: Lives with: lives with their family; spouse and brother   Lives in: House/apartment Stairs: Yes: Internal: 1 steps; none Has following equipment at home: Single point cane, Walker - 2 wheeled, and transport chair    OCCUPATION: Retired    PLOF: Independent  PATIENT GOALS: He wants to improve his balance and RLE pain so he can move around better   NEXT MD VISIT: July 2024    OBJECTIVE:  Note: Objective measures were completed at Evaluation unless otherwise noted.  VITALS BP 95/51 HR 79 SpO2 100%     DIAGNOSTIC FINDINGS:   CLINICAL DATA:  Right knee pain.   EXAM: MRI OF THE RIGHT KNEE WITHOUT CONTRAST   TECHNIQUE: Multiplanar, multisequence MR imaging of the knee was performed. No intravenous contrast was administered.   COMPARISON:  Radiographs 04/09/2022   FINDINGS: Examination is limited due to patient motion.   MENISCI   Medial meniscus: Severely degenerated and extensively torn. Inferior articular surface flap type tear with flipped meniscal fragment in the medial gutter. There is also an adjacent large synovial mass measuring approximately 2 cm wrapping around the posterior aspect of the tibia. Focal synovitis or possible nodular synovitis.   Lateral meniscus: Radial tear involving the posterior horn and oblique coursing superior articular surface tear involving the anterior horn midbody junction  region.   LIGAMENTS   Cruciates:  Intact.  Advanced mucoid degeneration of the ACL.   Collaterals:  Intact   CARTILAGE   Patellofemoral:  Advanced degenerative chondrosis.   Medial: Severe degenerative chondrosis with full-thickness cartilage loss, joint space narrowing and spurring.   Lateral:  Moderate to advanced degenerative chondrosis.   Joint: Small to moderate-sized joint effusion and areas of synovial inflammation.   Popliteal Fossa: No popliteal mass. Small to moderate-sized Baker's cyst.   Extensor Mechanism: The patella retinacular structures are intact and the quadriceps and patellar tendons are intact.   Bones:  No acute bony findings.   Other: Unremarkable knee musculature.   IMPRESSION: 1. Severely degenerated and extensively torn medial meniscus. 2. Radial tear involving the posterior horn of the lateral meniscus and oblique coursing superior articular surface tear involving the anterior horn midbody junction region. 3. Intact ligamentous structures and no acute bony findings. 4. Tricompartmental degenerative changes most severe in the medial compartment. 5. Small to moderate-sized joint effusion and areas of synovial inflammation. Small to moderate-sized Baker's cyst. 2 cm synovial mass as detailed above, likely nodular synovitis.     Electronically Signed   By: MYRTIS Stammer M.D.   On: 08/12/2022 07:25  /////////////////////////////////////////////////////////////////////////////////////////////////////////////////////  CLINICAL DATA:  Chronic low back pain radiating into both legs, worse over the past 3 months.   EXAM: MRI LUMBAR SPINE WITHOUT CONTRAST   TECHNIQUE: Multiplanar, multisequence MR imaging of the lumbar spine was performed. No intravenous contrast was administered.   COMPARISON:  Plain films lumbar spine 04/09/2022.   FINDINGS: Segmentation:  Standard.   Alignment:  Mild convex left curvature is unchanged.  No  listhesis.   Vertebrae:  No fracture, evidence of discitis, or bone lesion.   Conus medullaris and cauda equina: Conus extends to the L1 level. Conus and cauda equina appear normal.   Paraspinal and other  soft tissues: Negative.   Disc levels:   T11-12 is imaged in the sagittal plane only and negative.   T12-L1: A right lateral recess and foraminal protrusion causes mild to moderate narrowing in the lateral recess and foramen. The left foramen is open.   L1-2: Shallow disc bulge with endplate spurring. Mild narrowing in the right lateral recess and foramen. The left foramen is open.   L2-3: There is loss of disc space height with a broad-based bulge and endplate spur. Ligamentum flavum thickening is present. Moderately severe central canal stenosis is seen. The foramina are open.   L3-4: Loss of disc space height with a shallow bulge and endplate spur. There is mild-to-moderate bilateral facet degenerative change. Mild central canal and mild to moderate right foraminal narrowing are seen. The left foramen is open.   L4-5: There is a disc bulge with endplate spur, ligamentum flavum thickening and mild facet arthropathy. There is mild to moderate central canal and left foraminal stenosis. The right foramen is open.   L5-S1: Loss of disc space height with a shallow bulge and endplate spur. Moderate left foraminal stenosis is present. The central canal and right foramen are open.   IMPRESSION: 1. Multilevel spondylosis appears worst at L2-3 where there is moderately severe central canal stenosis. 2. Mild to moderate right foraminal narrowing at T12-L1 and L3-4. 3. Mild right lateral recess and foraminal narrowing at L1-2. 4. Mild to moderate central canal and left foraminal stenosis at L4-5. 5. Moderate left foraminal stenosis L5-S1.    PATIENT SURVEYS:  MODI: NT   COGNITION: Overall cognitive status: Within functional limits for tasks assessed     SENSATION: Light  touch: Impaired  pain   MUSCLE LENGTH: Not performed    POSTURE: No Significant postural limitations  PALPATION: Medial joint line of right knee    LUMBAR ROM:   AROM eval  Flexion   Extension   Right lateral flexion   Left lateral flexion   Right rotation   Left rotation    (Blank rows = not tested)  LOWER EXTREMITY ROM:     Active  Right eval Left eval  Hip flexion    Hip extension    Hip abduction    Hip adduction    Hip internal rotation    Hip external rotation    Knee flexion    Knee extension    Ankle dorsiflexion    Ankle plantarflexion    Ankle inversion    Ankle eversion     (Blank rows = not tested)  LOWER EXTREMITY MMT:    MMT Right eval Left eval  Hip flexion 4 4  Hip extension    Hip abduction    Hip adduction    Hip internal rotation 4 4  Hip external rotation 4 4  Knee flexion 4 4  Knee extension 4 4  Ankle dorsiflexion 4 4  Ankle plantarflexion    Ankle inversion    Ankle eversion     (Blank rows = not tested)  LUMBAR SPECIAL TESTS:  Straight leg raise test: NT, Slump test: NT, FABER test: NT, and FADIR NT   FUNCTIONAL TESTS:  TUG: NT    GAIT: Distance walked: 10 ft Assistive device utilized: Walker - 2 wheeled Level of assistance: Modified independence Comments: Wide base of support and shuffling. Decreased step length   TREATMENT DATE:   08/19/23  Sit to Stand with 1 UE support  x 2    PATIENT EDUCATION:  Education details: Form and technique for correct performance of exercise and description of what was found in objective examination to explain pt's symptoms.  Person educated: Patient Education method: Explanation, Demonstration, Verbal cues, and Handouts Education comprehension: verbalized understanding, returned demonstration, verbal cues required, and tactile cues required  HOME EXERCISE  PROGRAM: Access Code: [email or web address] URL: [email or web address] Date: 08/19/2023 Prepared by: Toribio Servant  Exercises - Sit to Stand with Armchair  - 3-4 x weekly - 2 sets - 5 reps  ASSESSMENT:  CLINICAL IMPRESSION: Patient is a 83 y.o. white male who was seen today for physical therapy evaluation and treatment for chronic right sided low back pain. Pt has signs and symptoms of right sided lumbar stenosis with increased pain with lumbar extension that radiates down the anterior surface of his right thigh. Despite initial referral for low back pain, pt's primary goal is to improve his balance. He mainly experiences episodes of imbalance when changing directions or when rushing to get to bathroom indicating functional incontinence. Given that patient has to use UE support for sit to stand, he shows decrease LE strength that places him at an increased risk for falls. His most recent fall as resulted in an injury to his left shoulder, and he has not been evaluated by a medical professional for this injury at this point. He is unable to raise left arm above his head. His deficits include decreased LE strength, abnormal gait, decreased balance, and increased low back pain. He will benefit from skilled PT to address these aforementioned deficits to decrease his risk for falls and to improve his overall mobility, so that he can remain independent and to decrease caregiver burden, which his sons appear to be primary caregivers at this point.   OBJECTIVE IMPAIRMENTS: Abnormal gait, decreased balance, decreased endurance, decreased knowledge of condition, decreased mobility, difficulty walking, decreased ROM, decreased strength, hypomobility, impaired flexibility, impaired sensation, and pain.   ACTIVITY LIMITATIONS: carrying, lifting, bending, standing, squatting, stairs, transfers, bathing, toileting, dressing, hygiene/grooming, and locomotion level  PARTICIPATION LIMITATIONS: meal prep, cleaning,  laundry, driving, shopping, community activity, and yard work  PERSONAL FACTORS: Age, Fitness, Past/current experiences, Time since onset of injury/illness/exacerbation, Transportation, and 3+ comorbidities: T2DM, CAD, chronic pain syndrome are also affecting patient's functional outcome. Language Barrier    REHAB POTENTIAL: Fair chronicity of condition and increased age    CLINICAL DECISION MAKING: Stable/uncomplicated  EVALUATION COMPLEXITY: Moderate   GOALS: Goals reviewed with patient? No  SHORT TERM GOALS: Target date: 09/02/2023  Patient will demonstrate undestanding of home exercise plan by performing exercises correctly with evidence of good carry over with min to no verbal or tactile cues .   Baseline: NT  Goal status: INITIAL  2.  Patient will be able to perform sit to stand without use of UE support as evidence of improved LE strength and decreased risk for falls.  Baseline: Needs to use UE support   Goal status: INITIAL  3.  Patient will show decreased risk of falling as evidence by decrease in self-selected gait speed by >= 0.12 m/sec.  Baseline: NT  Goal status: INITIAL   LONG TERM GOALS: Target date: 11/11/2023  Patient will improve activities specific balance scale score >=67% as evidence of an improvement self-perception of function and to decrease risk for falls.  Baseline: NT  Goal status: INITIAL  2.  Patient will perform TUG in <13.5 sec as evidence of improved  mobility and decreased risk for falling [facility] et al., 2000) Baseline: NT  Goal status: INITIAL  3.  Patient will increase 30 sec chair stands to >=10 reps as evidence of improved LE endurance and to decrease risk for falls.  Baseline: 0 reps  Goal status: INITIAL  4.  Patient will perform five sit to stand repetitions in <=15 secs as evidence of LE strength that shows that this community dwelling older adult that is older is at a decreased risk of falling. (Buatois 2010)   Baseline: 0 reps   Goal status: INITIAL  5.  Patient will score >=45 pts on Berg Balance Test as evidence of improved balance that places him at a decrease from falling.  Baseline: NT  Goal status: INITIAL  6.  Patient will perform in >=1.0 m./sec as evidence of improved mobility, decreased falls risk, and overall functional independence.  Baseline: NT   Goal status: INITIAL  PLAN:  PT FREQUENCY: 1-2x/week  PT DURATION: 12 weeks  PLANNED INTERVENTIONS: 97164- PT Re-evaluation, 97750- Physical Performance Testing, 97110-Therapeutic exercises, 97530- Therapeutic activity, [id number]- Neuromuscular re-education, 97535- Self Care, [id number]- Manual therapy, [id number]- Gait training, [id number]- Aquatic Therapy, [phone]- Electrical stimulation (unattended), [phone]- Electrical stimulation (manual), [id number]- Traction (mechanical), [id number]- Wound care (first 20 sq cm), 97598- Wound care (each additional 20 sq cm), 20560 (1-2 muscles), 20561 (3+ muscles)- Dry Needling, Patient/Family education, Balance training, Stair training, Taping, Joint mobilization, Joint manipulation, Spinal manipulation, Spinal mobilization, Vestibular training, DME instructions, Cryotherapy, and Moist heat.  PLAN FOR NEXT SESSION: ABC scale, 4 stage balance test, MCTSIB,  TUG, 10 mWT. Urination journal to track times.  Toribio Servant PT, DPT  [facility] [address] [place], [address] Phone: [phone]   Fax:  [phone]

## 2023-08-19 NOTE — Addendum Note (Signed)
 Addended by: THEOTIS TORIBIO PARAS on: 08/19/2023 03:37 PM   Modules accepted: Orders

## 2023-08-22 ENCOUNTER — Ambulatory Visit: Admitting: Physical Therapy

## 2023-09-03 ENCOUNTER — Telehealth: Payer: Self-pay | Admitting: Orthopedic Surgery

## 2023-09-03 ENCOUNTER — Ambulatory Visit: Attending: Orthopedic Surgery | Admitting: Physical Therapy

## 2023-09-03 DIAGNOSIS — R296 Repeated falls: Secondary | ICD-10-CM | POA: Insufficient documentation

## 2023-09-03 DIAGNOSIS — M25561 Pain in right knee: Secondary | ICD-10-CM

## 2023-09-03 DIAGNOSIS — R262 Difficulty in walking, not elsewhere classified: Secondary | ICD-10-CM | POA: Diagnosis present

## 2023-09-03 DIAGNOSIS — M5459 Other low back pain: Secondary | ICD-10-CM | POA: Diagnosis present

## 2023-09-03 NOTE — Progress Notes (Addendum)
 Referring Physician:  Center, Adrian Kim [facility] [address] [place],  [place] [address]  Primary Physician:  Center, Adrian Kim [facility]  IPAD used as interpreter. He speaks [place].   History of Present Illness: [name] has a history of HTN, CAD, heart valvular disease, MI, DM, diabetic peripheral neuropathy, dementia, chronic pain, mixed hyperlipidemia.   He is a poor historian. Son helps with history.   Last seen by me on 07/31/23 for back and bilateral leg pain. 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   He was sent to PT- had initial eval on 08/19/23 and has been to 2 additional visits. PT contacted me and recommended he see  ortho for his right knee. Referral was done.   He has an appointment with ortho on 09/25/23.  He is here for follow up.   He primary complaint is right knee pain with giving way. Pain is worse with standing and walking. He still notes intermittent LBP but his right knee is the worst pain.   He does not smoke.   Bowel/Bladder Dysfunction: none  Conservative measures:  Physical therapy: had initial eval on 08/19/23 and has been to 2 additional visits Multimodal medical therapy including regular antiinflammatories:  tylenol , ibuprofen  Injections:   05/07/23: Right L2-3 ESI (Dr. Tanya)  Past Surgery: no spinal surgeries   The symptoms are causing a significant impact on the patient's life.   Review of Systems:  A 10 point review of systems is negative, except for the pertinent positives and negatives detailed in the HPI.  Past Medical History: Past Medical History:  Diagnosis Date   Arthritis    Diabetes (HCC)    Glaucoma    Heart attack (HCC)    HLD (hyperlipidemia)    HTN (hypertension)    Lower back pain    Sleep apnea     Tinnitus     Past Surgical History: Past Surgical History:  Procedure Laterality Date   CATARACT EXTRACTION W/PHACO Right 11/16/2015   Procedure: CATARACT EXTRACTION PHACO AND INTRAOCULAR LENS PLACEMENT (IOC);  Surgeon: Adrian Etienne, MD;  Location[facility];  Service: Ophthalmology;  Laterality: Right;  DIABETIC - oral meds NEEDS Greek INTERPRETER   COLONOSCOPY WITH PROPOFOL  N/A 12/10/2014   Procedure: COLONOSCOPY WITH PROPOFOL ;  Surgeon: Adrian CINDERELLA Piedmont, MD;  Location: [facility];  Service: Gastroenterology;  Laterality: N/A;   CORONARY ANGIOPLASTY WITH STENT PLACEMENT      Allergies: Allergies as of 09/16/2023 - Review Complete 09/16/2023  Allergen Reaction Noted   Zolpidem  09/14/2014    Medications: Outpatient Encounter Medications as of 09/16/2023  Medication Sig   acetaminophen  (TYLENOL ) 325 MG tablet Take 650 mg by mouth every 6 (six) hours as needed.   aspirin EC 81 MG tablet Take by mouth.   cetirizine (ZYRTEC) 5 MG chewable tablet Chew 5 mg by mouth daily.   Cyanocobalamin  (VITAMIN B-12) 5000 MCG SUBL Place 1 tablet (5,000 mcg total) under the tongue daily.   dorzolamide-timolol  (COSOPT) 2-0.5 % ophthalmic solution 1 drop 2 (two) times daily.   glucose blood test strip    ibuprofen  (ADVIL ) 400 MG tablet Take 1 tablet (400 mg total) by mouth every 6 (six) hours as needed.   INVOKANA 300 MG TABS tablet Take 300 mg by mouth daily.   latanoprost (XALATAN) 0.005 % ophthalmic solution  linagliptin (TRADJENTA) 5 MG TABS tablet Take 5 mg by mouth daily.   lisinopril (PRINIVIL,ZESTRIL) 2.5 MG tablet    metFORMIN (GLUCOPHAGE-XR) 500 MG 24 hr tablet    rosuvastatin (CRESTOR) 20 MG tablet Take 20 mg by mouth at bedtime.   RYBELSUS 7 MG TABS Take 1 tablet by mouth daily.   sitaGLIPtin (JANUVIA) 25 MG tablet Take 25 mg by mouth daily.   tamsulosin  (FLOMAX ) 0.4 MG CAPS capsule Take 0.4 mg by mouth.   timolol  (TIMOPTIC ) 0.5 % ophthalmic solution    TRUEplus Lancets  28G MISC    [DISCONTINUED] fluticasone (FLONASE) 50 MCG/ACT nasal spray  (Patient not taking: Reported on 08/19/2023)   [DISCONTINUED] meclizine  (ANTIVERT ) 12.5 MG tablet Take 1 tablet (12.5 mg total) by mouth 3 (three) times daily as needed for dizziness. (Patient not taking: Reported on 08/19/2023)   No facility-administered encounter medications on file as of 09/16/2023.    Social History: Social History   Tobacco Use   Smoking status: Former   Smokeless tobacco: Never   Tobacco comments:    Quit 11 years ago  Substance Use Topics   Alcohol use: No    Alcohol/week: 0.0 standard drinks of alcohol   Drug use: No    Family Medical History: Family History  Problem Relation Age of Onset   Leukemia Son    Kidney disease Neg Hx    Prostate cancer Neg Hx     Physical Examination: Vitals:   09/16/23 1441  BP: (!) 130/52      Awake, alert, oriented to person, place, and time.  Speech is clear and fluent. Fund of knowledge is appropriate.   Cranial Nerves: Pupils equal round and reactive to light.  Facial tone is symmetric.    No lower lumbar tenderness.   No abnormal lesions on exposed skin.   Strength:  Side Iliopsoas Quads Hamstring PF DF EHL  R 5 5 5 5 5 5   L 5 5 5 5 5 5    He has no gross weakness, but has difficulty following commands.   Clonus is not present.    Bilateral lower extremity sensation is intact to light touch.     Gait not tested. He is in a WC.    He has mild medial joint line tenderness right knee with crepitus. No tenderness left knee.   Medical Decision Making  Imaging: None     Assessment and Plan: [name] is a poor historian. Son helps with history.   Currently, his primary complaint is right knee pain. He notes giving way of right knee and he has more pain with standing and walking.   He still has intermittent LBP, but this is not as bad as the right knee.   He has known lumbar spondylosis with DDD. He has moderate/severe  central stenosis L2-L3 with multilevel foraminal stenosis.   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   Treatment options discussed with patient and following plan made:  - He will see ortho next week as scheduled.  - Message to Toribio to cancel his PT visits on 8/4 and 8/6.  - Will plan to see him back in 6 weeks to regroup.   BP was 130/52. Bottom number is a little low. He has no symptoms of CP, SOB, dizziness. He feels good.   I spent a total of 30 minutes in face-to-face and non-face-to-face activities related to this patient's care today including review of outside records, review of imaging,  review of symptoms, physical exam, discussion of differential diagnosis, discussion of treatment options, and documentation.   Thank you for involving me in the care of this patient.   Adrian Boys PA-C Dept. of Neurosurgery

## 2023-09-03 NOTE — Telephone Encounter (Signed)
 Please call his son Debby at [phone].   Let him know PT reached out to me and recommend he see ortho for his right knee.   I can put in this referral. Do they want to see [facility] here at our office or [facility]?   Thanks!

## 2023-09-03 NOTE — Telephone Encounter (Signed)
 Unable to leave voicemail, mailbox is full

## 2023-09-03 NOTE — Therapy (Signed)
 OUTPATIENT PHYSICAL THERAPY THORACOLUMBAR TREATMENT   Patient Name: Adrian Kim MRN: [id number] DOB:11-Jan-1941, 83 y.o., male Today's Date: 09/03/2023  END OF SESSION:  PT End of Session - 09/03/23 1034     Visit Number 2    Number of Visits 24    Date for PT Re-Evaluation 10/28/23    Authorization Type Medicare 2025    Authorization - Visit Number 2    Authorization - Number of Visits 24    Progress Note Due on Visit 10    PT Start Time 0815    PT Stop Time 0900    PT Time Calculation (min) 45 min    Equipment Utilized During Treatment Gait belt;Other (comment)   2 wheeled walker   Activity Tolerance Patient limited by pain    Behavior During Therapy WFL for tasks assessed/performed           Past Medical History:  Diagnosis Date   Arthritis    Diabetes (HCC)    Glaucoma    Heart attack (HCC)    HLD (hyperlipidemia)    HTN (hypertension)    Lower back pain    Sleep apnea    Tinnitus    Past Surgical History:  Procedure Laterality Date   CATARACT EXTRACTION W/PHACO Right 11/16/2015   Procedure: CATARACT EXTRACTION PHACO AND INTRAOCULAR LENS PLACEMENT (IOC);  Surgeon: Dene Etienne, MD;  Location: [facility];  Service: Ophthalmology;  Laterality: Right;  DIABETIC - oral meds NEEDS Greek INTERPRETER   COLONOSCOPY WITH PROPOFOL  N/A 12/10/2014   Procedure: COLONOSCOPY WITH PROPOFOL ;  Surgeon: Deward CINDERELLA Piedmont, MD;  Location: [facility];  Service: Gastroenterology;  Laterality: N/A;   CORONARY ANGIOPLASTY WITH STENT PLACEMENT     Patient Active Problem List   Diagnosis Date Noted   Spinal stenosis, lumbar region, 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) 04/29/2023   Abnormal MRI, lumbar spine (01/26/2023) 01/28/2023   Complaints of weakness of lower extremity 01/03/2023   Language barrier 12/13/2022   Pes anserine bursitis (Left) 11/22/2022   Medial knee pain (Left) 11/22/2022   Chronic knee pain (Left) 11/22/2022   Osteoarthritis of knee (Left)  11/22/2022   Abnormal MRI, knee (08/12/2022) (Right) 10/10/2022   Osteoarthritis of knee (Right) 10/10/2022   Complex tear of medial meniscus of knee, sequela (Right) 10/10/2022   Complex tear of lateral meniscus of knee, sequela (Right) 10/10/2022   Baker's cyst of knee (Right) 10/10/2022   Effusion of knee joint (Right) 10/10/2022   Chronic knee pain (Right) 10/10/2022   Transient synovitis of knee (Right) 10/10/2022   Burning pain 10/10/2022   Diabetic peripheral neuropathy (HCC) 07/26/2022   Chronic painful diabetic neuropathy (HCC) 07/26/2022   Chronic low back pain (Bilateral) w/ sciatica (Bilateral) 05/31/2022   Generalized osteoarthritis of multiple sites 05/07/2022   Osteoarthritis of feet (Bilateral) 05/07/2022   Osteoarthritis of knees (Bilateral) (R>L) 05/07/2022   Osteoarthritis of hips (Bilateral) 05/07/2022   Balance problem 05/07/2022   Vertigo 05/07/2022   Vitamin D  deficiency 05/07/2022   Difficulty in walking 05/07/2022   DDD (degenerative disc disease), cervical 05/07/2022   Osteoarthritis of facet joint of cervical spine 05/07/2022   Cervical facet hypertrophy 05/07/2022   Grade 1 Retrolisthesis of C3/C4 05/07/2022   Osteoarthritis of shoulder (Right) 05/07/2022   Osteoarthritis of glenohumeral joint (Right) 05/07/2022   DDD (degenerative disc disease), lumbosacral 05/07/2022   Lumbar facet arthropathy (L3-4, L5-S1) 05/07/2022   Aortic atherosclerosis (HCC) 05/07/2022   Lumbar facet hypertrophy 05/07/2022   Osteoarthritis of facet joint of lumbar  spine 05/07/2022   Tricompartment osteoarthritis of knee (Right) 05/07/2022   Osteoarthritis of hands (Bilateral) 05/07/2022   Chronic pain syndrome 04/09/2022   Pharmacologic therapy 04/09/2022   Disorder of skeletal system 04/09/2022   Problems influencing health status 04/09/2022   Non-insulin dependent type 2 diabetes mellitus (HCC) 04/09/2022   Chronic hand pain (Bilateral) 04/09/2022   Chronic knee pain (1ry  area of Pain) (Bilateral) (R>L) 04/09/2022   Chronic hip pain (Bilateral) 04/09/2022   Chronic neck pain 04/09/2022   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 04/09/2022   Chronic lower extremity pain (Bilateral) (R>L) 04/09/2022   Trigger ring finger of left hand 04/09/2022   Chronic feet pain (Bilateral) 04/09/2022   Rib pain (Bilateral) 04/09/2022   Mild cognitive impairment 04/09/2022   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 Glade Boys    REFERRING DIAG:  [phone] (ICD-10-CM) - Lumbar spondylosis  M54.16 (ICD-10-CM) - Lumbar radiculopathy  M48.062 (ICD-10-CM) - Spinal stenosis, lumbar region, with neurogenic claudication    Rationale for Evaluation and Treatment: Rehabilitation  THERAPY DIAG:  Other low back pain  Difficulty in walking, not elsewhere classified  Frequent falls  ONSET DATE: 2024    SUBJECTIVE:                                                                                                                                                                                           SUBJECTIVE STATEMENT: Son is present with father to translate. Pt reports severe pain in his right knee that has make it difficult for him to move or engage in physical activity. Son says that his father has poor vision, because of glaucoma and that his other eye is getting worse too.    PERTINENT HISTORY:  He is accompanied by his son who serves as his interpreter. Patient is hard of hearing.  Pt reports increased right sided low back pain especially when getting up to walk. This started to happen about a year ago. He experiences a lot of the pain in the medial joint line of his right knee and he  has received three shots in the past. He has had repeated falls in the past year and the most recent one resulted in a fall on his left side on to his shoulder.   PAIN:  Are you having pain? Yes: NPRS scale: 10/10 worst   Pain location: Knee down to right foot on anterior surface  Pain description: Achy  Aggravating factors: Walking or standing. Relieving factors:    PRECAUTIONS: Fall  RED FLAGS: None   WEIGHT BEARING RESTRICTIONS: No  FALLS:  Has patient fallen in last 6 months? Yes. Number of falls 2-3; turning to quickly     LIVING ENVIRONMENT: Lives with: lives with their family; spouse and brother   Lives in: House/apartment Stairs: Yes: Internal: 1 steps; none Has following equipment at home: Single point cane, Walker - 2 wheeled, and transport chair    OCCUPATION: Retired    PLOF: Independent  PATIENT GOALS: He wants to improve his balance and RLE pain so he can move around better   NEXT MD VISIT: July 2024    OBJECTIVE:  Note: Objective measures were completed at Evaluation unless otherwise noted.  VITALS BP 95/51 HR 79 SpO2 100%      DIAGNOSTIC FINDINGS:   CLINICAL DATA:  Right knee pain.   EXAM: MRI OF THE RIGHT KNEE WITHOUT CONTRAST   TECHNIQUE: Multiplanar, multisequence MR imaging of the knee was performed. No intravenous contrast was administered.   COMPARISON:  Radiographs 04/09/2022   FINDINGS: Examination is limited due to patient motion.   MENISCI   Medial meniscus: Severely degenerated and extensively torn. Inferior articular surface flap type tear with flipped meniscal fragment in the medial gutter. There is also an  adjacent large synovial mass measuring approximately 2 cm wrapping around the posterior aspect of the tibia. Focal synovitis or possible nodular synovitis.   Lateral meniscus: Radial tear involving the posterior horn and oblique coursing superior articular surface tear involving the anterior horn midbody junction region.   LIGAMENTS   Cruciates:  Intact.  Advanced mucoid degeneration of the ACL.   Collaterals:  Intact   CARTILAGE   Patellofemoral:  Advanced degenerative chondrosis.   Medial: Severe degenerative chondrosis with full-thickness cartilage loss, joint space narrowing and spurring.   Lateral:  Moderate to advanced degenerative chondrosis.   Joint: Small to moderate-sized joint effusion and areas of synovial inflammation.   Popliteal Fossa: No popliteal mass. Small to moderate-sized Baker's cyst.   Extensor Mechanism: The patella retinacular structures are intact and the quadriceps and patellar tendons are intact.   Bones:  No acute bony findings.   Other: Unremarkable knee musculature.   IMPRESSION: 1. Severely degenerated and extensively torn medial meniscus. 2. Radial tear involving the posterior horn of the lateral meniscus and oblique coursing superior articular surface tear involving the anterior horn midbody junction region. 3. Intact ligamentous structures and no acute bony findings. 4. Tricompartmental degenerative changes most severe in the medial compartment. 5. Small to moderate-sized joint effusion and areas of synovial inflammation. Small to moderate-sized Baker's cyst. 2 cm synovial mass as detailed above, likely nodular synovitis.     Electronically Signed   By: MYRTIS Stammer M.D.   On: 08/12/2022 07:25  /////////////////////////////////////////////////////////////////////////////////////////////////////////////////////  CLINICAL DATA:  Chronic low back pain radiating into both legs, worse over the past 3 months.   EXAM: MRI  LUMBAR SPINE WITHOUT CONTRAST   TECHNIQUE: Multiplanar, multisequence MR imaging of the lumbar spine was performed. No intravenous contrast was administered.   COMPARISON:  Plain films  lumbar spine 04/09/2022.   FINDINGS: Segmentation:  Standard.   Alignment:  Mild convex left curvature is unchanged.  No listhesis.   Vertebrae:  No fracture, evidence of discitis, or bone lesion.   Conus medullaris and cauda equina: Conus extends to the L1 level. Conus and cauda equina appear normal.   Paraspinal and other soft tissues: Negative.   Disc levels:   T11-12 is imaged in the sagittal plane only and negative.   T12-L1: A right lateral recess and foraminal protrusion causes mild to moderate narrowing in the lateral recess and foramen. The left foramen is open.   L1-2: Shallow disc bulge with endplate spurring. Mild narrowing in the right lateral recess and foramen. The left foramen is open.   L2-3: There is loss of disc space height with a broad-based bulge and endplate spur. Ligamentum flavum thickening is present. Moderately severe central canal stenosis is seen. The foramina are open.   L3-4: Loss of disc space height with a shallow bulge and endplate spur. There is mild-to-moderate bilateral facet degenerative change. Mild central canal and mild to moderate right foraminal narrowing are seen. The left foramen is open.   L4-5: There is a disc bulge with endplate spur, ligamentum flavum thickening and mild facet arthropathy. There is mild to moderate central canal and left foraminal stenosis. The right foramen is open.   L5-S1: Loss of disc space height with a shallow bulge and endplate spur. Moderate left foraminal stenosis is present. The central canal and right foramen are open.   IMPRESSION: 1. Multilevel spondylosis appears worst at L2-3 where there is moderately severe central canal stenosis. 2. Mild to moderate right foraminal narrowing at T12-L1 and L3-4. 3.  Mild right lateral recess and foraminal narrowing at L1-2. 4. Mild to moderate central canal and left foraminal stenosis at L4-5. 5. Moderate left foraminal stenosis L5-S1.    PATIENT SURVEYS:  MODI: NT   COGNITION: Overall cognitive status: Within functional limits for tasks assessed     SENSATION: Light touch: Impaired  pain   MUSCLE LENGTH: Not performed    POSTURE: No Significant postural limitations  PALPATION: Medial joint line of right knee    LUMBAR ROM:   AROM eval  Flexion   Extension   Right lateral flexion   Left lateral flexion   Right rotation   Left rotation    (Blank rows = not tested)  LOWER EXTREMITY ROM:     Active  Right eval Left eval  Hip flexion    Hip extension    Hip abduction    Hip adduction    Hip internal rotation    Hip external rotation    Knee flexion    Knee extension    Ankle dorsiflexion    Ankle plantarflexion    Ankle inversion    Ankle eversion     (Blank rows = not tested)  LOWER EXTREMITY MMT:    MMT Right eval Left eval  Hip flexion 4 4  Hip extension    Hip abduction    Hip adduction    Hip internal rotation 4 4  Hip external rotation 4 4  Knee flexion 4 4  Knee extension 4 4  Ankle dorsiflexion 4 4  Ankle plantarflexion    Ankle inversion    Ankle eversion     (Blank rows = not tested)  LUMBAR SPECIAL TESTS:  Straight leg raise test: NT, Slump test: NT, FABER test: NT, and FADIR NT   FUNCTIONAL TESTS:  TUG:  NT    GAIT: Distance walked: 10 ft Assistive device utilized: Walker - 2 wheeled Level of assistance: Modified independence Comments: Wide base of support and shuffling. Decreased step length   TREATMENT DATE:  09/03/23: THEREX   -Pt reports increased dizziness when changing directions   Lumbar Flexion AAROM with silver ball roll center flexion, right center flexion, left center flexion 3 x 30    -Pt reports increased pain in right shoulder, which he reports is from having a fall     Long Arc Quad 2 x 10  Activities Balance Scale (ABC): 25%   PHYSICAL PERFORMANCE   Orthostatic Vitals   Sitting: 100/53 Standing 103/51   TUG: 59.93 sec with use of 2WW   Gait Speed  4 meters/22 sec= 0.31 m/sec        PATIENT EDUCATION:  Education details: Form and technique for correct performance of exercise and description of what was found in objective examination to explain pt's symptoms.  Person educated: Patient Education method: Explanation, Demonstration, Verbal cues, and Handouts Education comprehension: verbalized understanding, returned demonstration, verbal cues required, and tactile cues required  HOME EXERCISE PROGRAM: Access Code: [email or web address] URL: [email or web address] Date: 09/03/2023 Prepared by: Toribio Servant  Exercises - Sit to Stand with Armchair  - 3-4 x weekly - 2 sets - 5 reps - Seated Long Arc Quad  - 1 x daily - 7 x weekly - 3 sets - 10 reps - Seated 3 Way Exercise Ball Roll Out Stretch  - 1 x daily - 7 x weekly - 3 sets - 10 reps  ASSESSMENT:  CLINICAL IMPRESSION: Pt presents for initial treatment follow initial eval. He continues to demonstrate decreased mobility, LE strength, visual input, and increased right knee pain and low back pain that indicate he is an increased risk for falls. Pt's right knee pain that radiates from right knee to his right foot is likely the result of ongoing meniscal repair that has not been surgically repaired. While he does experience increased low back pain especially with lumbar extension, his right knee pain from prior knee injury is the most limiting factor to his balance and mobility and creates the biggest risk for him falling with right knee giving out. He has already had a number of falls. Orthostasis ruled out as cause for dizziness when walking and further testing needed here to determine what is causing pt to experience vertigo especially when changing directions.  PT recommends that son have day  sitter come into home to monitor his father when he is not present, which is throughout most of the day due to risk for pt sustaining a fall. PT also recommends that pt utilize a wedge pillow when sleeping to decrease provocation of lumbar stenotic symptoms. 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 PT recommends that pt follow up with orthopedist about ongoing right knee pain for further medical evaluation and treatment given severity of pain and how limited he is by pain during session especially with weight bearing activity. Pt will benefit from skilled PT to address these aforementioned deficits to decrease his risk for falls and to improve his overall mobility, so that he can remain independent and to decrease caregiver burden, which his sons appear to be primary caregivers at this point.   OBJECTIVE IMPAIRMENTS: Abnormal gait, decreased balance, decreased endurance, decreased knowledge of condition, decreased mobility, difficulty walking, decreased ROM, decreased strength, hypomobility, impaired  flexibility, impaired sensation, and pain.   ACTIVITY LIMITATIONS: carrying, lifting, bending, standing, squatting, stairs, transfers, bathing, toileting, dressing, hygiene/grooming, and locomotion level  PARTICIPATION LIMITATIONS: meal prep, cleaning, laundry, driving, shopping, community activity, and yard work  PERSONAL FACTORS: Age, Fitness, Past/current experiences, Time since onset of injury/illness/exacerbation, Transportation, and 3+ comorbidities: T2DM, CAD, chronic pain syndrome are also affecting patient's functional outcome. Language Barrier    REHAB POTENTIAL: Fair chronicity of condition and increased age    CLINICAL DECISION MAKING: Stable/uncomplicated  EVALUATION COMPLEXITY: Moderate   GOALS: Goals reviewed with patient? No  SHORT TERM GOALS: Target date: 09/02/2023  Patient will  demonstrate undestanding of home exercise plan by performing exercises correctly with evidence of good carry over with min to no verbal or tactile cues .   Baseline: NT  Goal status: ACHIEVED    2.  Patient will be able to perform sit to stand without use of UE support as evidence of improved LE strength and decreased risk for falls.  Baseline: Needs to use UE support   Goal status: NOT MET    3.  Patient will show decreased risk of falling as evidence by decrease in self-selected gait speed by >= 0.12 m/sec.  Baseline: 0.31 m/sec   Goal status: NOT MET     LONG TERM GOALS: Target date: 11/11/2023  Patient will improve activities specific balance scale score >=67% as evidence of an improvement self-perception of function and to decrease risk for falls.  Baseline: 25%  Goal status: ONGOING    2.  Patient will perform TUG in <13.5 sec as evidence of improved mobility and decreased risk for falling [facility] et al., 2000) Baseline: 59.93 sec with use of 2ww Goal status: ONGOING   3.  Patient will increase 30 sec chair stands to >=10 reps as evidence of improved LE endurance and to decrease risk for falls.  Baseline: 0 reps  Goal status: ONGOING   4.  Patient will perform five sit to stand repetitions in <=15 secs as evidence of LE strength that shows that this community dwelling older adult that is older is at a decreased risk of falling. (Buatois 2010)   Baseline: 0 reps  Goal status: ONGOING    5.  Patient will score >=45 pts on Berg Balance Test as evidence of improved balance that places him at a decrease from falling.  Baseline: Pt's function too low for this test to accurately measure fall risk  Goal status: DEFERRED   6.  Patient will perform in >=1.0 m./sec as evidence of improved mobility, decreased falls risk, and overall functional independence.  Baseline: 0.31 m/sec  Goal status: ONGOING   7. Patient will demonstrate a total score on a Tinneti POMA test that  is >=22 points that indicates he has a decreased risk for falling.   Baseline: NT  Goal status: ONGOING   PLAN:  PT FREQUENCY: 1-2x/week  PT DURATION: 12 weeks  PLANNED INTERVENTIONS: 97164- PT Re-evaluation, 97750- Physical Performance Testing, 97110-Therapeutic exercises, 97530- Therapeutic activity, [id number]- Neuromuscular re-education, 97535- Self Care, [id number]- Manual therapy, [id number]- Gait training, [phone]- Aquatic Therapy, [phone]- Electrical stimulation (unattended), [phone]- Electrical stimulation (manual), [id number]- Traction (mechanical), [id number]- Wound care (first 20 sq cm), 97598- Wound care (each additional 20 sq cm), 20560 (1-2 muscles), 20561 (3+ muscles)- Dry Needling, Patient/Family education, Balance training, Stair training, Taping, Joint mobilization, Joint manipulation, Spinal manipulation, Spinal mobilization, Vestibular training, DME instructions, Cryotherapy, and Moist heat.  PLAN FOR NEXT SESSION:  Tinetti POMA. Examine right knee-ROM,  strength, swelling, etc.   MCTSIB. Further assessment of vertigo.   Toribio Servant PT, DPT  [facility] [address], [place], [address] Phone: [phone]   Fax:  [phone]

## 2023-09-04 NOTE — Telephone Encounter (Signed)
 Patient would like to be seen here [facility] [place]

## 2023-09-04 NOTE — Addendum Note (Signed)
 Addended byBETHA HILMA HASTINGS on: 09/04/2023 12:57 PM   Modules accepted: Orders

## 2023-09-04 NOTE — Telephone Encounter (Signed)
2nd attempt - Mailbox is full

## 2023-09-04 NOTE — Telephone Encounter (Signed)
 Referral put in for [facility] here in [place]. Please let his son know they should call him.

## 2023-09-11 ENCOUNTER — Ambulatory Visit: Admitting: Physical Therapy

## 2023-09-11 DIAGNOSIS — M5459 Other low back pain: Secondary | ICD-10-CM

## 2023-09-11 DIAGNOSIS — R296 Repeated falls: Secondary | ICD-10-CM

## 2023-09-11 DIAGNOSIS — R262 Difficulty in walking, not elsewhere classified: Secondary | ICD-10-CM

## 2023-09-11 NOTE — Therapy (Signed)
 OUTPATIENT PHYSICAL THERAPY THORACOLUMBAR TREATMENT   Patient Name: Adrian Kim MRN: [id number] DOB:15-Jul-1940, 83 y.o., male Today's Date: 09/11/2023  END OF SESSION:  PT End of Session - 09/11/23 0956     Visit Number 3    Number of Visits 24    Date for PT Re-Evaluation 10/28/23    Authorization Type Medicare 2025    Authorization - Visit Number 3    Authorization - Number of Visits 24    Progress Note Due on Visit 10    PT Start Time 0950    PT Stop Time 1030    PT Time Calculation (min) 40 min    Equipment Utilized During Treatment Gait belt;Other (comment)   2 wheeled walker   Activity Tolerance Patient limited by pain    Behavior During Therapy WFL for tasks assessed/performed           Past Medical History:  Diagnosis Date   Arthritis    Diabetes (HCC)    Glaucoma    Heart attack (HCC)    HLD (hyperlipidemia)    HTN (hypertension)    Lower back pain    Sleep apnea    Tinnitus    Past Surgical History:  Procedure Laterality Date   CATARACT EXTRACTION W/PHACO Right 11/16/2015   Procedure: CATARACT EXTRACTION PHACO AND INTRAOCULAR LENS PLACEMENT (IOC);  Surgeon: Dene Etienne, MD;  Location: [facility];  Service: Ophthalmology;  Laterality: Right;  DIABETIC - oral meds NEEDS Greek INTERPRETER   COLONOSCOPY WITH PROPOFOL  N/A 12/10/2014   Procedure: COLONOSCOPY WITH PROPOFOL ;  Surgeon: Deward CINDERELLA Piedmont, MD;  Location: [facility];  Service: Gastroenterology;  Laterality: N/A;   CORONARY ANGIOPLASTY WITH STENT PLACEMENT     Patient Active Problem List   Diagnosis Date Noted   Spinal stenosis, lumbar region, 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) 04/29/2023   Abnormal MRI, lumbar spine (01/26/2023) 01/28/2023   Complaints of weakness of lower extremity 01/03/2023   Language barrier 12/13/2022   Pes anserine bursitis (Left) 11/22/2022   Medial knee pain (Left) 11/22/2022   Chronic knee pain (Left) 11/22/2022   Osteoarthritis of knee (Left)  11/22/2022   Abnormal MRI, knee (08/12/2022) (Right) 10/10/2022   Osteoarthritis of knee (Right) 10/10/2022   Complex tear of medial meniscus of knee, sequela (Right) 10/10/2022   Complex tear of lateral meniscus of knee, sequela (Right) 10/10/2022   Baker's cyst of knee (Right) 10/10/2022   Effusion of knee joint (Right) 10/10/2022   Chronic knee pain (Right) 10/10/2022   Transient synovitis of knee (Right) 10/10/2022   Burning pain 10/10/2022   Diabetic peripheral neuropathy (HCC) 07/26/2022   Chronic painful diabetic neuropathy (HCC) 07/26/2022   Chronic low back pain (Bilateral) w/ sciatica (Bilateral) 05/31/2022   Generalized osteoarthritis of multiple sites 05/07/2022   Osteoarthritis of feet (Bilateral) 05/07/2022   Osteoarthritis of knees (Bilateral) (R>L) 05/07/2022   Osteoarthritis of hips (Bilateral) 05/07/2022   Balance problem 05/07/2022   Vertigo 05/07/2022   Vitamin D  deficiency 05/07/2022   Difficulty in walking 05/07/2022   DDD (degenerative disc disease), cervical 05/07/2022   Osteoarthritis of facet joint of cervical spine 05/07/2022   Cervical facet hypertrophy 05/07/2022   Grade 1 Retrolisthesis of C3/C4 05/07/2022   Osteoarthritis of shoulder (Right) 05/07/2022   Osteoarthritis of glenohumeral joint (Right) 05/07/2022   DDD (degenerative disc disease), lumbosacral 05/07/2022   Lumbar facet arthropathy (L3-4, L5-S1) 05/07/2022   Aortic atherosclerosis (HCC) 05/07/2022   Lumbar facet hypertrophy 05/07/2022   Osteoarthritis of facet joint of lumbar  spine 05/07/2022   Tricompartment osteoarthritis of knee (Right) 05/07/2022   Osteoarthritis of hands (Bilateral) 05/07/2022   Chronic pain syndrome 04/09/2022   Pharmacologic therapy 04/09/2022   Disorder of skeletal system 04/09/2022   Problems influencing health status 04/09/2022   Non-insulin dependent type 2 diabetes mellitus (HCC) 04/09/2022   Chronic hand pain (Bilateral) 04/09/2022   Chronic knee pain (1ry  area of Pain) (Bilateral) (R>L) 04/09/2022   Chronic hip pain (Bilateral) 04/09/2022   Chronic neck pain 04/09/2022   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 04/09/2022   Chronic lower extremity pain (Bilateral) (R>L) 04/09/2022   Trigger ring finger of left hand 04/09/2022   Chronic feet pain (Bilateral) 04/09/2022   Rib pain (Bilateral) 04/09/2022   Mild cognitive impairment 04/09/2022   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 Glade Boys    REFERRING DIAG:  [phone] (ICD-10-CM) - Lumbar spondylosis  M54.16 (ICD-10-CM) - Lumbar radiculopathy  M48.062 (ICD-10-CM) - Spinal stenosis, lumbar region, with neurogenic claudication    Rationale for Evaluation and Treatment: Rehabilitation  THERAPY DIAG:  Other low back pain  Difficulty in walking, not elsewhere classified  Frequent falls  ONSET DATE: 2024    SUBJECTIVE:                                                                                                                                                                                           SUBJECTIVE STATEMENT: Son present to provide translation for father. Pt continues experience right knee pain especially when performing sit to stand transfer. This decreased mobility is likely from prior ligamentous knee injury noted on prior imaging listed below. Pt's decreased knee extension is  likely from knee injuries and not from decreased hamstring flexibility.       PERTINENT HISTORY:  He is accompanied by his son who serves as his interpreter. Patient is hard of hearing.  Pt reports increased right sided low back pain especially when getting up to walk. This started to happen about a year ago. He experiences a lot of the pain in the medial joint line  of his right knee and he has received three shots in the past. He has had repeated falls in the past year and the most recent one resulted in a fall on his left side on to his shoulder.   PAIN:  Are you having pain? Yes: NPRS scale: 10/10 worst   Pain location: Knee down to right foot on anterior surface  Pain description: Achy  Aggravating factors: Walking or standing. Relieving factors:    PRECAUTIONS: Fall  RED FLAGS: None   WEIGHT BEARING RESTRICTIONS: No  FALLS:  Has patient fallen in last 6 months? Yes. Number of falls 2-3; turning to quickly     LIVING ENVIRONMENT: Lives with: lives with their family; spouse and brother   Lives in: House/apartment Stairs: Yes: Internal: 1 steps; none Has following equipment at home: Single point cane, Walker - 2 wheeled, and transport chair    OCCUPATION: Retired    PLOF: Independent  PATIENT GOALS: He wants to improve his balance and RLE pain so he can move around better   NEXT MD VISIT: July 2024    OBJECTIVE:  Note: Objective measures were completed at Evaluation unless otherwise noted.  VITALS BP 95/51 HR 79 SpO2 100%      DIAGNOSTIC FINDINGS:   CLINICAL DATA:  Right knee pain.   EXAM: MRI OF THE RIGHT KNEE WITHOUT CONTRAST   TECHNIQUE: Multiplanar, multisequence MR imaging of the knee was performed. No intravenous contrast was administered.   COMPARISON:  Radiographs 04/09/2022   FINDINGS: Examination is limited due to patient motion.   MENISCI   Medial meniscus: Severely degenerated and extensively torn. Inferior articular surface flap type tear  with flipped meniscal fragment in the medial gutter. There is also an adjacent large synovial mass measuring approximately 2 cm wrapping around the posterior aspect of the tibia. Focal synovitis or possible nodular synovitis.   Lateral meniscus: Radial tear involving the posterior horn and oblique coursing superior articular surface tear involving the anterior horn midbody junction region.   LIGAMENTS   Cruciates:  Intact.  Advanced mucoid degeneration of the ACL.   Collaterals:  Intact   CARTILAGE   Patellofemoral:  Advanced degenerative chondrosis.   Medial: Severe degenerative chondrosis with full-thickness cartilage loss, joint space narrowing and spurring.   Lateral:  Moderate to advanced degenerative chondrosis.   Joint: Small to moderate-sized joint effusion and areas of synovial inflammation.   Popliteal Fossa: No popliteal mass. Small to moderate-sized Baker's cyst.   Extensor Mechanism: The patella retinacular structures are intact and the quadriceps and patellar tendons are intact.   Bones:  No acute bony findings.   Other: Unremarkable knee musculature.   IMPRESSION: 1. Severely degenerated and extensively torn medial meniscus. 2. Radial tear involving the posterior horn of the lateral meniscus and oblique coursing superior articular surface tear involving the anterior horn midbody junction region. 3. Intact ligamentous structures and no acute bony findings. 4. Tricompartmental degenerative changes most severe in the medial compartment. 5. Small to moderate-sized joint effusion and areas of synovial inflammation. Small to moderate-sized Baker's cyst. 2 cm synovial mass as detailed above, likely nodular synovitis.     Electronically Signed   By: MYRTIS Stammer M.D.   On: 08/12/2022 07:25  /////////////////////////////////////////////////////////////////////////////////////////////////////////////////////  CLINICAL DATA:  Chronic low back pain  radiating into both legs, worse over the past 3 months.   EXAM: MRI LUMBAR SPINE WITHOUT CONTRAST   TECHNIQUE: Multiplanar, multisequence MR imaging of the lumbar spine was performed. No intravenous contrast was administered.  COMPARISON:  Plain films lumbar spine 04/09/2022.   FINDINGS: Segmentation:  Standard.   Alignment:  Mild convex left curvature is unchanged.  No listhesis.   Vertebrae:  No fracture, evidence of discitis, or bone lesion.   Conus medullaris and cauda equina: Conus extends to the L1 level. Conus and cauda equina appear normal.   Paraspinal and other soft tissues: Negative.   Disc levels:   T11-12 is imaged in the sagittal plane only and negative.   T12-L1: A right lateral recess and foraminal protrusion causes mild to moderate narrowing in the lateral recess and foramen. The left foramen is open.   L1-2: Shallow disc bulge with endplate spurring. Mild narrowing in the right lateral recess and foramen. The left foramen is open.   L2-3: There is loss of disc space height with a broad-based bulge and endplate spur. Ligamentum flavum thickening is present. Moderately severe central canal stenosis is seen. The foramina are open.   L3-4: Loss of disc space height with a shallow bulge and endplate spur. There is mild-to-moderate bilateral facet degenerative change. Mild central canal and mild to moderate right foraminal narrowing are seen. The left foramen is open.   L4-5: There is a disc bulge with endplate spur, ligamentum flavum thickening and mild facet arthropathy. There is mild to moderate central canal and left foraminal stenosis. The right foramen is open.   L5-S1: Loss of disc space height with a shallow bulge and endplate spur. Moderate left foraminal stenosis is present. The central canal and right foramen are open.   IMPRESSION: 1. Multilevel spondylosis appears worst at L2-3 where there is moderately severe central canal  stenosis. 2. Mild to moderate right foraminal narrowing at T12-L1 and L3-4. 3. Mild right lateral recess and foraminal narrowing at L1-2. 4. Mild to moderate central canal and left foraminal stenosis at L4-5. 5. Moderate left foraminal stenosis L5-S1.    PATIENT SURVEYS:  MODI: NT   COGNITION: Overall cognitive status: Within functional limits for tasks assessed     SENSATION: Light touch: Impaired  pain   MUSCLE LENGTH: Not performed    POSTURE: No Significant postural limitations  PALPATION: Medial joint line of right knee    LUMBAR ROM:   AROM eval  Flexion   Extension   Right lateral flexion   Left lateral flexion   Right rotation   Left rotation    (Blank rows = not tested)  LOWER EXTREMITY ROM:     Active /Passive Right eval Left eval  Hip flexion    Hip extension    Hip abduction    Hip adduction    Hip internal rotation    Hip external rotation    Knee flexion 110 115  Knee extension 7/7 7/7  Ankle dorsiflexion    Ankle plantarflexion    Ankle inversion    Ankle eversion     (Blank rows = not tested)  LOWER EXTREMITY MMT:    MMT Right eval Left eval  Hip flexion 4 4  Hip extension    Hip abduction    Hip adduction    Hip internal rotation 4 4  Hip external rotation 4 4  Knee flexion 4 4  Knee extension 4 4  Ankle dorsiflexion 4 4  Ankle plantarflexion    Ankle inversion    Ankle eversion     (Blank rows = not tested)  LUMBAR SPECIAL TESTS:  Straight leg raise test: NT, Slump test: NT, FABER test: NT, and FADIR NT  FUNCTIONAL TESTS:  TUG: NT    GAIT: Distance walked: 10 ft Assistive device utilized: Walker - 2 wheeled Level of assistance: Modified independence Comments: Wide base of support and shuffling. Decreased step length   TREATMENT DATE:  09/11/23: THEREX   Sit<>Stand Transfer CGA using 2WW  Knee ROM  -Flex R/L 110/115   -Ext R/L 7/7   Knee circumference: 16/16 Straight Leg Raise with knee slightly flexed   : 90/90  Lower Trunk Rotation 2 x 10 - 3 sec   Supine Bridges 2 x 10  Seated HS stretch with 6 inch step with stretch 4 x 60 sec    -mod TC for pressure on knee for increased extension and overpressure on toe.  Palpation: Medial joint line of knee is hard and enlarged compared to lateral side of knee especially on right knee  SELF CARE HOME MANAGEMENT  Provided education to pt's son about how to stretch calf musculature with patient in supine and dorsiflexing toes while keeping knee extended with overpressure at knee joint      PATIENT EDUCATION:  Education details: Form and technique for correct performance of exercise and description of what was found in objective examination to explain pt's symptoms.  Person educated: Patient Education method: Explanation, Demonstration, Verbal cues, and Handouts Education comprehension: verbalized understanding, returned demonstration, verbal cues required, and tactile cues required  HOME EXERCISE PROGRAM: Access Code: [email or web address] URL: [email or web address] Date: 09/11/2023 Prepared by: Toribio Servant  Exercises - Seated Gastroc Stretch with Strap  - 1 x daily - 7 x weekly - 2-3 reps - 60 sec   hold - Sit to Stand with Armchair  - 3-4 x weekly - 2 sets - 5 reps - Supine Lower Trunk Rotation  - 1 x daily - 7 x weekly - 2 sets - 10 reps - 2 sec  hold - Seated 3 Way Exercise Ball Roll Out Stretch  - 1 x daily - 7 x weekly - 3 sets - 10 reps - Supine Bridge  - 3-4 x weekly - 3 sets - 10 reps - Seated Long Arc Quad  - 1 x daily - 7 x weekly - 3 sets - 10 reps  ASSESSMENT:  CLINICAL IMPRESSION: Pt demonstrates decreased bilateral knee ROM with left worse than right with increased medial joint line swelling. This decreased mobility is likely from prior ligamentous knee injury noted on prior imaging listed below. Pt's decreased knee extension is likely from knee injuries and not from decreased hamstring flexibility. Aside from pt's knee  swelling, pt decreased balance is also due to visual acuity from glaucoma. PT continues to recommend sitter to be at house while pt's son is not present to monitor patient to avoid falls. Pt will continue to benefit from skilled PT to address these aforementioned deficits to decrease his risk for falls and to improve his overall mobility, so that he can remain independent and to decrease caregiver burden, which his sons appear to be primary caregivers at this point.    OBJECTIVE IMPAIRMENTS: Abnormal gait, decreased balance, decreased endurance, decreased knowledge of condition, decreased mobility, difficulty walking, decreased ROM, decreased strength, hypomobility, impaired flexibility, impaired sensation, and pain.   ACTIVITY LIMITATIONS: carrying, lifting, bending, standing, squatting, stairs, transfers, bathing, toileting, dressing, hygiene/grooming, and locomotion level  PARTICIPATION LIMITATIONS: meal prep, cleaning, laundry, driving, shopping, community activity, and yard work  PERSONAL FACTORS: Age, Fitness, Past/current experiences, Time since onset of injury/illness/exacerbation, Transportation, and 3+ comorbidities: T2DM, CAD, chronic pain syndrome are also affecting patient's  functional outcome. Language Barrier    REHAB POTENTIAL: Fair chronicity of condition and increased age    CLINICAL DECISION MAKING: Stable/uncomplicated  EVALUATION COMPLEXITY: Moderate   GOALS: Goals reviewed with patient? No  SHORT TERM GOALS: Target date: 09/02/2023  Patient will demonstrate undestanding of home exercise plan by performing exercises correctly with evidence of good carry over with min to no verbal or tactile cues .   Baseline: NT 09/11/23: Performing independently  Goal status: ACHIEVED    2.  Patient will be able to perform sit to stand without use of UE support as evidence of improved LE strength and decreased risk for falls.  Baseline: Needs to use UE support   Goal status: NOT MET     3.  Patient will show decreased risk of falling as evidence by decrease in self-selected gait speed by >= 0.12 m/sec.  Baseline: 0.31 m/sec   Goal status: NOT MET     LONG TERM GOALS: Target date: 11/11/2023  Patient will improve activities specific balance scale score >=67% as evidence of an improvement self-perception of function and to decrease risk for falls.  Baseline: 25%  Goal status: ONGOING    2.  Patient will perform TUG in <13.5 sec as evidence of improved mobility and decreased risk for falling [facility] et al., 2000) Baseline: 59.93 sec with use of 2ww Goal status: ONGOING   3.  Patient will increase 30 sec chair stands to >=10 reps as evidence of improved LE endurance and to decrease risk for falls.  Baseline: 0 reps  Goal status: ONGOING   4.  Patient will perform five sit to stand repetitions in <=15 secs as evidence of LE strength that shows that this community dwelling older adult that is older is at a decreased risk of falling. (Buatois 2010)   Baseline: 0 reps  Goal status: ONGOING    5.  Patient will score >=45 pts on Berg Balance Test as evidence of improved balance that places him at a decrease from falling.  Baseline: Pt's function too low for this test to accurately measure fall risk  Goal status: DEFERRED   6.  Patient will perform in >=1.0 m./sec as evidence of improved mobility, decreased falls risk, and overall functional independence.  Baseline: 0.31 m/sec  Goal status: ONGOING   7. Patient will demonstrate a total score on a Tinneti POMA test that is >=22 points that indicates he has a decreased risk for falling.   Baseline: NT  Goal status: ONGOING   PLAN:  PT FREQUENCY: 1-2x/week  PT DURATION: 12 weeks  PLANNED INTERVENTIONS: 97164- PT Re-evaluation, 97750- Physical Performance Testing, 97110-Therapeutic exercises, 97530- Therapeutic activity, [id number]- Neuromuscular re-education, 97535- Self Care, [id number]- Manual therapy, [id number]- Gait  training, [phone]- Aquatic Therapy, [phone]- Electrical stimulation (unattended), [phone]- Electrical stimulation (manual), [id number]- Traction (mechanical), [id number]- Wound care (first 20 sq cm), 97598- Wound care (each additional 20 sq cm), 20560 (1-2 muscles), 20561 (3+ muscles)- Dry Needling, Patient/Family education, Balance training, Stair training, Taping, Joint mobilization, Joint manipulation, Spinal manipulation, Spinal mobilization, Vestibular training, DME instructions, Cryotherapy, and Moist heat.  PLAN FOR NEXT SESSION:  Postural exercises in sitting for strength and balance. Tinetti POMA.  MCTSIB. Manual: stretching of knee musculature.   Toribio Servant PT, DPT  [facility] [address], [place], [address] Phone: [phone]   Fax:  [phone]

## 2023-09-16 ENCOUNTER — Encounter: Payer: Self-pay | Admitting: Orthopedic Surgery

## 2023-09-16 ENCOUNTER — Ambulatory Visit (INDEPENDENT_AMBULATORY_CARE_PROVIDER_SITE_OTHER): Admitting: Orthopedic Surgery

## 2023-09-16 VITALS — BP 130/52

## 2023-09-16 DIAGNOSIS — M5136 Other intervertebral disc degeneration, lumbar region with discogenic back pain only: Secondary | ICD-10-CM | POA: Diagnosis not present

## 2023-09-16 DIAGNOSIS — M25561 Pain in right knee: Secondary | ICD-10-CM | POA: Diagnosis not present

## 2023-09-16 DIAGNOSIS — M48062 Spinal stenosis, lumbar region with neurogenic claudication: Secondary | ICD-10-CM

## 2023-09-16 DIAGNOSIS — M48061 Spinal stenosis, lumbar region without neurogenic claudication: Secondary | ICD-10-CM

## 2023-09-16 DIAGNOSIS — G608 Other hereditary and idiopathic neuropathies: Secondary | ICD-10-CM

## 2023-09-16 DIAGNOSIS — M47816 Spondylosis without myelopathy or radiculopathy, lumbar region: Secondary | ICD-10-CM

## 2023-09-16 DIAGNOSIS — M5416 Radiculopathy, lumbar region: Secondary | ICD-10-CM

## 2023-09-17 ENCOUNTER — Ambulatory Visit: Admitting: Physical Therapy

## 2023-09-18 ENCOUNTER — Ambulatory Visit: Admitting: Orthopedic Surgery

## 2023-09-24 ENCOUNTER — Ambulatory Visit: Admitting: Physical Therapy

## 2023-09-24 ENCOUNTER — Telehealth: Payer: Self-pay

## 2023-09-24 ENCOUNTER — Telehealth: Payer: Self-pay | Admitting: Physical Therapy

## 2023-09-24 NOTE — Telephone Encounter (Signed)
 Called pt's son to inquire about absence from apt. Pt's son said that Dr. Hilma does not want his father to return to physical therapy until after seeing orthopedist which he is scheduled to see this week. PT to cancel all of pt's apts this week and he will be returning on the 12th at 3:15

## 2023-09-24 NOTE — Telephone Encounter (Signed)
 Called patient and gave him the address to [facility] to have x rays  orders are in

## 2023-09-25 ENCOUNTER — Telehealth: Payer: Self-pay

## 2023-09-25 ENCOUNTER — Ambulatory Visit (INDEPENDENT_AMBULATORY_CARE_PROVIDER_SITE_OTHER): Admitting: Orthopedic Surgery

## 2023-09-25 ENCOUNTER — Ambulatory Visit
Admission: RE | Admit: 2023-09-25 | Discharge: 2023-09-25 | Disposition: A | Discharge status: Home / Self Care | Source: Ambulatory Visit | Attending: Orthopedic Surgery | Admitting: Orthopedic Surgery

## 2023-09-25 ENCOUNTER — Ambulatory Visit
Admission: RE | Admit: 2023-09-25 | Discharge: 2023-09-25 | Disposition: A | Discharge status: Home / Self Care | Attending: Orthopedic Surgery | Admitting: Orthopedic Surgery

## 2023-09-25 ENCOUNTER — Telehealth: Payer: Self-pay | Admitting: Orthopedic Surgery

## 2023-09-25 DIAGNOSIS — G8929 Other chronic pain: Secondary | ICD-10-CM

## 2023-09-25 DIAGNOSIS — M25561 Pain in right knee: Secondary | ICD-10-CM | POA: Diagnosis present

## 2023-09-25 DIAGNOSIS — M1711 Unilateral primary osteoarthritis, right knee: Secondary | ICD-10-CM | POA: Diagnosis not present

## 2023-09-25 NOTE — Telephone Encounter (Signed)
 Imaging office called and stated that they were unable to get through with a phone call to the [place] office. Patient is currently there to get x-ray before appointment later today, but order is not in for that x-ray. Patient states it is x-ray for Right Knee.

## 2023-09-25 NOTE — Patient Instructions (Signed)
 We will initiate authorization for hyaluronic injections.   We will schedule this visits once we have authorization

## 2023-09-26 ENCOUNTER — Encounter: Payer: Self-pay | Admitting: Orthopedic Surgery

## 2023-09-26 ENCOUNTER — Ambulatory Visit: Admitting: Physical Therapy

## 2023-09-26 NOTE — Progress Notes (Signed)
 New Patient Visit  Assessment: Adrian Kim is a 83 y.o. male with the following: 1. Arthritis of right knee  Plan: Adrian Kim notes chronic pain of the right knee.  Due to radiographs demonstrate advanced degenerative changes.  He has had multiple injections in the right knee, and is adamant that steroids have made his symptoms worse.  In addition, he does not get much relief from steroid injections.  He is not a good candidate for surgery.  According to the son, he is not interested in surgery.  We discussed proceeding with hyaluronic acid injections, so we will initiate authorization.  Once we have authorization, we will schedule appointments to proceed with hyaluronic acid injections in his right knee.  Follow-up: Return for After Insurance Authorization for Injection.  Subjective:  Chief Complaint  Patient presents with   Knee Pain    Pain in the right knee for a while now  painful when he walks but sitting it doesn't He has pain when sitting to standing and standing to sitting  steps is painful     History of Present Illness: Adrian Kim is a 83 y.o. male who has been referred by Glade Boys, PA-C for evaluation of right knee pain.  He has had pain in the right knee for several years.  No specific injury.  He has arthritis in the right knee.  He continues to have pain.  He has difficulty standing.  He has difficulty ambulating without assistance.  His son is in clinic with him today, answering most of the questions.  He has been treated by pain management.  He has had multiple injections in the right knee.  Steroids are not effective.  According the patient, he feels as though steroids have made his pain worse.  Limited ambulation.  He does not take medicines on regular basis.   Review of Systems: No fevers or chills No numbness or tingling No chest pain No shortness of breath No bowel or bladder dysfunction No GI distress No headaches   Medical History:  Past  Medical History:  Diagnosis Date   Arthritis    Diabetes (HCC)    Glaucoma    Heart attack (HCC)    HLD (hyperlipidemia)    HTN (hypertension)    Lower back pain    Sleep apnea    Tinnitus     Past Surgical History:  Procedure Laterality Date   CATARACT EXTRACTION W/PHACO Right 11/16/2015   Procedure: CATARACT EXTRACTION PHACO AND INTRAOCULAR LENS PLACEMENT (IOC);  Surgeon: Dene Etienne, MD;  Location: [facility];  Service: Ophthalmology;  Laterality: Right;  DIABETIC - oral meds NEEDS Greek INTERPRETER   COLONOSCOPY WITH PROPOFOL  N/A 12/10/2014   Procedure: COLONOSCOPY WITH PROPOFOL ;  Surgeon: Deward CINDERELLA Piedmont, MD;  Location: [facility];  Service: Gastroenterology;  Laterality: N/A;   CORONARY ANGIOPLASTY WITH STENT PLACEMENT      Family History  Problem Relation Age of Onset   Leukemia Son    Kidney disease Neg Hx    Prostate cancer Neg Hx    Social History   Tobacco Use   Smoking status: Former   Smokeless tobacco: Never   Tobacco comments:    Quit 11 years ago  Substance Use Topics   Alcohol use: No    Alcohol/week: 0.0 standard drinks of alcohol   Drug use: No    Allergies  Allergen Reactions   Zolpidem     Other reaction(s): Other (See Comments) Confusion    Current Meds  Medication  Sig   acetaminophen  (TYLENOL ) 325 MG tablet Take 650 mg by mouth every 6 (six) hours as needed.   aspirin EC 81 MG tablet Take by mouth.   cetirizine (ZYRTEC) 5 MG chewable tablet Chew 5 mg by mouth daily.   Cyanocobalamin  (VITAMIN B-12) 5000 MCG SUBL Place 1 tablet (5,000 mcg total) under the tongue daily.   dorzolamide-timolol  (COSOPT) 2-0.5 % ophthalmic solution 1 drop 2 (two) times daily.   glucose blood test strip    ibuprofen  (ADVIL ) 400 MG tablet Take 1 tablet (400 mg total) by mouth every 6 (six) hours as needed.   INVOKANA 300 MG TABS tablet Take 300 mg by mouth daily.   latanoprost (XALATAN) 0.005 % ophthalmic solution    linagliptin (TRADJENTA) 5 MG  TABS tablet Take 5 mg by mouth daily.   lisinopril (PRINIVIL,ZESTRIL) 2.5 MG tablet    metFORMIN (GLUCOPHAGE-XR) 500 MG 24 hr tablet    rosuvastatin (CRESTOR) 20 MG tablet Take 20 mg by mouth at bedtime.   RYBELSUS 7 MG TABS Take 1 tablet by mouth daily.   sitaGLIPtin (JANUVIA) 25 MG tablet Take 25 mg by mouth daily.   tamsulosin  (FLOMAX ) 0.4 MG CAPS capsule Take 0.4 mg by mouth.   timolol  (TIMOPTIC ) 0.5 % ophthalmic solution    TRUEplus Lancets 28G MISC     Objective: There were no vitals taken for this visit.  Physical Exam:  General: Alert and oriented., No acute distress., and Seated in a wheelchair. Gait: Unable to ambulate.  Evaluation of the right knee demonstrates a mild deformity.  Varus alignment overall.  Tenderness to palpation along the medial joint line.  Range of motion from 10-95 degrees.  No increased laxity varus or valgus stress.  Negative Lachman.  IMAGING: I personally ordered and reviewed the following images  X-rays of the right knee demonstrates varus alignment.  Complete loss of joint space in the medial compartment.  Advanced degenerative changes.  There are associated osteophytes.   New Medications:  No orders of the defined types were placed in this encounter.     Adrian DELENA Horde, MD  09/26/2023 12:14 PM

## 2023-10-01 ENCOUNTER — Telehealth: Payer: Self-pay | Admitting: Physical Therapy

## 2023-10-01 ENCOUNTER — Ambulatory Visit: Attending: Orthopedic Surgery | Admitting: Physical Therapy

## 2023-10-01 NOTE — Telephone Encounter (Signed)
 Pt not present at apt. PT called to inquire about absence. Unable to reach and son's VM full. This is the first No Show and future no shows will result from removal from schedule.

## 2023-10-02 NOTE — Telephone Encounter (Signed)
 VOB submitted for Orthovisc, right knee

## 2023-10-03 ENCOUNTER — Ambulatory Visit: Admitting: Physical Therapy

## 2023-10-03 ENCOUNTER — Telehealth: Payer: Self-pay | Admitting: Physical Therapy

## 2023-10-03 NOTE — Telephone Encounter (Signed)
 Patient has now showed two times now and he is being removed from schedule per the attendance policy

## 2023-10-07 ENCOUNTER — Ambulatory Visit: Admitting: Physical Therapy

## 2023-10-09 ENCOUNTER — Ambulatory Visit: Admitting: Physical Therapy

## 2023-10-14 ENCOUNTER — Ambulatory Visit: Admitting: Physical Therapy

## 2023-10-16 ENCOUNTER — Ambulatory Visit: Admitting: Physical Therapy

## 2023-10-29 ENCOUNTER — Other Ambulatory Visit: Payer: Self-pay

## 2023-10-29 DIAGNOSIS — M1711 Unilateral primary osteoarthritis, right knee: Secondary | ICD-10-CM

## 2023-10-30 NOTE — Progress Notes (Deleted)
 Referring Physician:  Center, Carlin Blamer [facility] [address] [place],  [place] [address]  Primary Physician:  Center, Carlin Blamer [facility]  IPAD used as interpreter. He speaks [place].   History of Present Illness: [name] has a history of HTN, CAD, heart valvular disease, MI, DM, diabetic peripheral neuropathy, dementia, chronic pain, mixed hyperlipidemia.   He is a poor historian. Son helps with history.   Last seen by me on 07/31/23 for back and bilateral leg pain. 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   He was sent to PT- had initial eval on 08/19/23 and has been to 2 additional visits. PT contacted me and recommended he see  ortho for his right knee. Referral was done.   He has an appointment with ortho on 09/25/23.  He is here for follow up.   He primary complaint is right knee pain with giving way. Pain is worse with standing and walking. He still notes intermittent LBP but his right knee is the worst pain.   He does not smoke.   Bowel/Bladder Dysfunction: none  Conservative measures:  Physical therapy: had initial eval on 08/19/23 and has been to 2 additional visits Multimodal medical therapy including regular antiinflammatories:  tylenol , ibuprofen  Injections:   05/07/23: Right L2-3 ESI (Dr. Tanya)  Past Surgery: no spinal surgeries   The symptoms are causing a significant impact on the patient's life.   Review of Systems:  A 10 point review of systems is negative, except for the pertinent positives and negatives detailed in the HPI.  Past Medical History: Past Medical History:  Diagnosis Date   Arthritis    Diabetes (HCC)    Glaucoma    Heart attack (HCC)    HLD (hyperlipidemia)    HTN (hypertension)    Lower back pain    Sleep apnea     Tinnitus     Past Surgical History: Past Surgical History:  Procedure Laterality Date   CATARACT EXTRACTION W/PHACO Right 11/16/2015   Procedure: CATARACT EXTRACTION PHACO AND INTRAOCULAR LENS PLACEMENT (IOC);  Surgeon: Adrian Etienne, MD;  Location: [facility];  Service: Ophthalmology;  Laterality: Right;  DIABETIC - oral meds NEEDS Greek INTERPRETER   COLONOSCOPY WITH PROPOFOL  N/A 12/10/2014   Procedure: COLONOSCOPY WITH PROPOFOL ;  Surgeon: Adrian CINDERELLA Piedmont, MD;  Location: [facility];  Service: Gastroenterology;  Laterality: N/A;   CORONARY ANGIOPLASTY WITH STENT PLACEMENT      Allergies: Allergies as of 10/31/2023 - Review Complete 09/26/2023  Allergen Reaction Noted   Zolpidem  09/14/2014    Medications: Outpatient Encounter Medications as of 10/31/2023  Medication Sig   acetaminophen  (TYLENOL ) 325 MG tablet Take 650 mg by mouth every 6 (six) hours as needed.   aspirin EC 81 MG tablet Take by mouth.   cetirizine (ZYRTEC) 5 MG chewable tablet Chew 5 mg by mouth daily.   Cyanocobalamin  (VITAMIN B-12) 5000 MCG SUBL Place 1 tablet (5,000 mcg total) under the tongue daily.   dorzolamide-timolol  (COSOPT) 2-0.5 % ophthalmic solution 1 drop 2 (two) times daily.   glucose blood test strip    ibuprofen  (ADVIL ) 400 MG tablet Take 1 tablet (400 mg total) by mouth every 6 (six) hours as needed.   INVOKANA 300 MG TABS tablet Take 300 mg by mouth daily.   latanoprost (XALATAN) 0.005 % ophthalmic solution  linagliptin (TRADJENTA) 5 MG TABS tablet Take 5 mg by mouth daily.   lisinopril (PRINIVIL,ZESTRIL) 2.5 MG tablet    metFORMIN (GLUCOPHAGE-XR) 500 MG 24 hr tablet    rosuvastatin (CRESTOR) 20 MG tablet Take 20 mg by mouth at bedtime.   RYBELSUS 7 MG TABS Take 1 tablet by mouth daily.   sitaGLIPtin (JANUVIA) 25 MG tablet Take 25 mg by mouth daily.   tamsulosin  (FLOMAX ) 0.4 MG CAPS capsule Take 0.4 mg by mouth.   timolol  (TIMOPTIC ) 0.5 % ophthalmic solution    TRUEplus Lancets  28G MISC    No facility-administered encounter medications on file as of 10/31/2023.    Social History: Social History   Tobacco Use   Smoking status: Former   Smokeless tobacco: Never   Tobacco comments:    Quit 11 years ago  Substance Use Topics   Alcohol use: No    Alcohol/week: 0.0 standard drinks of alcohol   Drug use: No    Family Medical History: Family History  Problem Relation Age of Onset   Leukemia Son    Kidney disease Neg Hx    Prostate cancer Neg Hx     Physical Examination: There were no vitals filed for this visit.     Awake, alert, oriented to person, place, and time.  Speech is clear and fluent. Fund of knowledge is appropriate.   Cranial Nerves: Pupils equal round and reactive to light.  Facial tone is symmetric.    No lower lumbar tenderness.   No abnormal lesions on exposed skin.   Strength:  Side Iliopsoas Quads Hamstring PF DF EHL  R 5 5 5 5 5 5   L 5 5 5 5 5 5    He has no gross weakness, but has difficulty following commands.   Clonus is not present.    Bilateral lower extremity sensation is intact to light touch.     Gait not tested. He is in a WC.    He has mild medial joint line tenderness right knee with crepitus. No tenderness left knee.   Medical Decision Making  Imaging: None     Assessment and Plan: Adrian Kim is a poor historian. Son helps with history.   Currently, his primary complaint is right knee pain. He notes giving way of right knee and he has more pain with standing and walking.   He still has intermittent LBP, but this is not as bad as the right knee.   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   Treatment options discussed with patient and following plan made:  - He will see ortho next week as scheduled.  - Message to Adrian Kim to  cancel his PT visits on 8/4 and 8/6.  - Will plan to see him back in 6 weeks to regroup.   BP was 130/52. Bottom number is a little low. He has no symptoms of CP, SOB, dizziness. He feels good.   I spent a total of 30 minutes in face-to-face and non-face-to-face activities related to this patient's care today including review of outside records, review of imaging, review of symptoms, physical exam, discussion of differential diagnosis, discussion of treatment options, and documentation.   Thank you for involving me in the care of this patient.   Glade Boys PA-C Dept. of Neurosurgery

## 2023-10-31 ENCOUNTER — Ambulatory Visit: Admitting: Orthopedic Surgery

## 2023-11-12 ENCOUNTER — Telehealth (HOSPITAL_BASED_OUTPATIENT_CLINIC_OR_DEPARTMENT_OTHER): Payer: Self-pay

## 2023-11-12 NOTE — Telephone Encounter (Signed)
 It should be okay to space them out.

## 2023-11-12 NOTE — Telephone Encounter (Signed)
 Patient is scheduled on Thursday for Dr Onesimo for [employer]. This is a three series and Dr Onesimo is only in [place] 2 times a month. Should we switch it monovisc or is it ok to space the injections out every 2 weeks?

## 2023-11-14 ENCOUNTER — Ambulatory Visit: Admitting: Orthopedic Surgery

## 2023-11-28 ENCOUNTER — Ambulatory Visit: Admitting: Orthopedic Surgery

## 2023-11-28 DIAGNOSIS — M1711 Unilateral primary osteoarthritis, right knee: Secondary | ICD-10-CM | POA: Diagnosis not present

## 2023-12-01 ENCOUNTER — Encounter: Payer: Self-pay | Admitting: Orthopedic Surgery

## 2023-12-01 NOTE — Progress Notes (Signed)
 Return Patient Visit  Assessment: Adrian Kim is a 83 y.o. male with the following: 1. Arthritis of right knee  Plan: [name] has severe right knee arthritis.  He is interested in HA injections.  First injection completed today.  Follow up in 1 week for the next injection.   Procedure note injection Right knee joint   Verbal consent was obtained to inject the right knee joint  Timeout was completed to confirm the site of injection.  The skin was prepped with alcohol and ethyl chloride was sprayed at the injection site.  A 21-gauge needle was used to inject Orthovisc hyaluronic acid into the right knee using an anterolateral approach.  There were no complications. A sterile bandage was applied.   This patient is diagnosed with osteoarthritis of the knee(s).    Radiographs show evidence of joint space narrowing, osteophytes, subchondral sclerosis and/or subchondral cysts.  This patient has knee pain which interferes with functional and activities of daily living.    This patient has experienced inadequate response, adverse effects and/or intolerance with conservative treatments such as acetaminophen , NSAIDS, topical creams, physical therapy or regular exercise, knee bracing and/or weight loss.   This patient has experienced inadequate response or has a contraindication to intra articular steroid injections for at least 3 months.   This patient is not scheduled to have a total knee replacement within 6 months of starting treatment with viscosupplementation.   Follow-up: Return in about 1 week (around 12/05/2023).  Subjective:  Chief Complaint  Patient presents with   Right knee pain    HA Injection #1    History of Present Illness: Adrian Kim is a 83 y.o. male who returns to clinic today for repeat evaluation of right knee pain.  Chronic pain. No recent injury. Known arthritis.  Steroid injections have not been effective.  He is interested in HA injection.     Review of Systems: No fevers or chills No numbness or tingling No chest pain No shortness of breath No bowel or bladder dysfunction No GI distress No headaches     Objective: There were no vitals taken for this visit.  Physical Exam:  General: Alert and oriented., No acute distress., and Seated in a wheelchair. Gait: Unable to ambulate.  Evaluation of the right knee demonstrates a mild deformity.  Varus alignment overall.  Tenderness to palpation along the medial joint line.  Range of motion from 10-95 degrees.  No increased laxity varus or valgus stress.  Negative Lachman.  IMAGING: I personally ordered and reviewed the following images  X-rays of the right knee demonstrates varus alignment.  Complete loss of joint space in the medial compartment.  Advanced degenerative changes.  There are associated osteophytes.   New Medications:  No orders of the defined types were placed in this encounter.     Oneil DELENA Horde, MD  12/01/2023 12:23 AM

## 2023-12-03 ENCOUNTER — Ambulatory Visit: Admitting: Orthopedic Surgery

## 2023-12-05 ENCOUNTER — Ambulatory Visit (INDEPENDENT_AMBULATORY_CARE_PROVIDER_SITE_OTHER): Admitting: Physician Assistant

## 2023-12-05 DIAGNOSIS — G8929 Other chronic pain: Secondary | ICD-10-CM | POA: Diagnosis not present

## 2023-12-05 DIAGNOSIS — M1711 Unilateral primary osteoarthritis, right knee: Secondary | ICD-10-CM | POA: Diagnosis not present

## 2023-12-05 MED ORDER — HYALURONAN 30 MG/2ML IX SOSY
30.0000 mg | PREFILLED_SYRINGE | INTRA_ARTICULAR | Status: AC | PRN
  Administered 2023-12-05: 30 mg via INTRA_ARTICULAR

## 2023-12-05 NOTE — Progress Notes (Signed)
 Orthopedic Follow-Up Note  Orthovisc injection #2   SUBJECTIVE:   Adrian Kim is a 83 y.o. year old with longstanding severe right knee arthritis.  Patient received first HA injection by Dr. Oneil Horde on 11/28/2023 at [facility] location.  Patient is in a once a week x 3-week Orthovisc schedule. Patient presents today for second injection.  Patient presents today with son, Debby (who is father's primary care taker [patient is poor historian due to dementia] and also assists with translation - [place]).  Patient and son both report possible mild decrease in right knee pain since last injection.  Patient states he had some joint effusion/edema after last injection, that has since resolved.  Patient's son states he does not remember noticing any significant joint effusion.  Patient denies knee erythema, worsening pain, radiating pain, or any other new/concerning symptom.     Past Medical History:  Diagnosis Date   Arthritis    Diabetes (HCC)    Glaucoma    Heart attack (HCC)    HLD (hyperlipidemia)    HTN (hypertension)    Lower back pain    Sleep apnea    Tinnitus    Past Surgical History:  Procedure Laterality Date   CATARACT EXTRACTION W/PHACO Right 11/16/2015   Procedure: CATARACT EXTRACTION PHACO AND INTRAOCULAR LENS PLACEMENT (IOC);  Surgeon: Dene Etienne, MD;  Location: [facility];  Service: Ophthalmology;  Laterality: Right;  DIABETIC - oral meds NEEDS Greek INTERPRETER   COLONOSCOPY WITH PROPOFOL  N/A 12/10/2014   Procedure: COLONOSCOPY WITH PROPOFOL ;  Surgeon: Deward CINDERELLA Piedmont, MD;  Location: [facility];  Service: Gastroenterology;  Laterality: N/A;   CORONARY ANGIOPLASTY WITH STENT PLACEMENT      Current Outpatient Medications:    acetaminophen  (TYLENOL ) 325 MG tablet, Take 650 mg by mouth every 6 (six) hours as needed., Disp: , Rfl:    aspirin EC 81 MG tablet, Take by mouth., Disp: , Rfl:    cetirizine (ZYRTEC) 5 MG chewable tablet, Chew 5 mg by  mouth daily., Disp: , Rfl:    Cyanocobalamin  (VITAMIN B-12) 5000 MCG SUBL, Place 1 tablet (5,000 mcg total) under the tongue daily., Disp: 30 tablet, Rfl: 0   dorzolamide-timolol  (COSOPT) 2-0.5 % ophthalmic solution, 1 drop 2 (two) times daily., Disp: , Rfl:    glucose blood test strip, , Disp: , Rfl:    ibuprofen  (ADVIL ) 400 MG tablet, Take 1 tablet (400 mg total) by mouth every 6 (six) hours as needed., Disp: 30 tablet, Rfl: 0   INVOKANA 300 MG TABS tablet, Take 300 mg by mouth daily., Disp: , Rfl:    latanoprost (XALATAN) 0.005 % ophthalmic solution, , Disp: , Rfl:    linagliptin (TRADJENTA) 5 MG TABS tablet, Take 5 mg by mouth daily., Disp: , Rfl:    lisinopril (PRINIVIL,ZESTRIL) 2.5 MG tablet, , Disp: , Rfl:    metFORMIN (GLUCOPHAGE-XR) 500 MG 24 hr tablet, , Disp: , Rfl:    rosuvastatin (CRESTOR) 20 MG tablet, Take 20 mg by mouth at bedtime., Disp: , Rfl:    RYBELSUS 7 MG TABS, Take 1 tablet by mouth daily., Disp: , Rfl:    sitaGLIPtin (JANUVIA) 25 MG tablet, Take 25 mg by mouth daily., Disp: , Rfl:    tamsulosin  (FLOMAX ) 0.4 MG CAPS capsule, Take 0.4 mg by mouth., Disp: , Rfl:    timolol  (TIMOPTIC ) 0.5 % ophthalmic solution, , Disp: , Rfl:    TRUEplus Lancets 28G MISC, , Disp: , Rfl:  Allergies  Allergen Reactions  Zolpidem     Other reaction(s): Other (See Comments) Confusion   Social History   Socioeconomic History   Marital status: Married    Spouse name: Not on file   Number of children: Not on file   Years of education: Not on file   Highest education level: Not on file  Occupational History   Not on file  Tobacco Use   Smoking status: Former   Smokeless tobacco: Never   Tobacco comments:    Quit 11 years ago  Substance and Sexual Activity   Alcohol use: No    Alcohol/week: 0.0 standard drinks of alcohol   Drug use: No   Sexual activity: Not on file  Other Topics Concern   Not on file  Social History Narrative   Not on file   Social Drivers of Health    Financial Resource Strain: Not on file  Food Insecurity: Not on file  Transportation Needs: Not on file  Physical Activity: Not on file  Stress: Not on file  Social Connections: Not on file  Intimate Partner Violence: Not on file   Family History  Problem Relation Age of Onset   Leukemia Son    Kidney disease Neg Hx    Prostate cancer Neg Hx      Skin: (-) rash,(-) psoriasis,(-) eczema, (-)skin cancer.  Musculoskeletal: (-) fractures,  (-) dislocations,(-) collagen vascular disease, (-) fibromyalgia, (-) multiple sclerosis, (-) muscular dystrophy, (-) RSD,(-) joint pain (-)swelling, (-) joint pain,swelling. Neurologic: (-) epilepsy, (-)seizures,(-) brain tumor,(-) TIA, (-)stroke, (-)headaches, (-)Parkinson disease,(-) memory loss, (-) LOC. Cardiovascular: (-) Chest pain, (-) swelling in legs/feet, (-) SOB, (-) cramping in legs/feet with walking.    OBJECTIVE:    Constitutional:   The patient is alert and oriented x 3, appears to be stated age and in no distress.  Patient sitting comfortably in wheelchair. Gait/ambulation not assessed.   Orthopaedic Examination:  Right knee inspection reveals moderate medial enlargement/deformity (equal bilaterally). No erythema. No joint effusion. Slight varus alignment overall. Mild tenderness with palpation over medial joint line. Range of motion 10-100. No valgus or varus laxity. Negative posterior and anterior drawer testing.   IMAGING:   None performed today Previous right knee xray demonstrates significant loss of medial joint space. Advanced degenerative changes.     IMPRESSION: Right knee osteoarthritis  Large Joint Inj: R knee on 12/05/2023 5:12 PM Indications: pain Details: 22 G 1.5 in needle, anterolateral approach Medications: 30 mg Hyaluronan 30 MG/2ML Outcome: tolerated well, no immediate complications Procedure, treatment alternatives, risks and benefits explained, specific risks discussed. Consent was given by the  patient (Patient and patient's son, Debby). Immediately prior to procedure a time out was called to verify the correct patient, procedure, equipment, support staff and site/side marked as required. Patient was prepped and draped in the usual sterile fashion.       Procedure: The risks and benefits of a cortisone injection were discussed and the patient wishes to proceed.  The anterolateral compartment of the right knee was cleansed with alcohol swabs and ChloraPrep.  The skin was flash cooled with Ethyl Chloride, and using sterile technique, a 20 gauge needle was immediately introduced into the joint.  After aspiration revealed a flash of clear yellow tinged joint fluid the injectate which consisted of pre-filled Orthovisc syringe easily flowed into the joint.  The needle was withdrawn and pressure was applied for hemostasis.  A bandage was applied.  The patient tolerated the procedure well. Patient instructed to ice the area tonight  if sore.  General reactions: A flare reaction. This generally occurs 6-8 hours after receiving the injection.  Ice the area and take Tylenol  for pain.  If the pain lasts longer than 48 hours call the office.   Some patients may experience flushing, increased heart rate, red face or increase in body temperature.  This is rare but can happen.   Over the counter Benadryl, if appropriate, often reduces these symptoms.  For a severe reaction contact your family doctor or go to the emergency room.  If you have any other questions, feel free to ask.      PLAN:  1. Progressing well since last encounter in office. No significant change in symptoms. 2. Recommend continuation of Orthovisc schedule.  3. Follow-up in clinic in one week for third Orthovisc injection 4. All questions and concerns were answered.  Patient can call any time with further concerns.

## 2023-12-12 ENCOUNTER — Ambulatory Visit (INDEPENDENT_AMBULATORY_CARE_PROVIDER_SITE_OTHER): Admitting: Orthopedic Surgery

## 2023-12-12 ENCOUNTER — Encounter: Payer: Self-pay | Admitting: Orthopedic Surgery

## 2023-12-12 DIAGNOSIS — M1711 Unilateral primary osteoarthritis, right knee: Secondary | ICD-10-CM | POA: Diagnosis not present

## 2023-12-12 NOTE — Progress Notes (Signed)
 Return Patient Visit  Assessment: Adrian Kim is a 83 y.o. male with the following: 1. 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  Third injection was completed in clinic today.  There were no issues.  Contact the clinic if there are any questions or concerns.  We are happy to see him in clinic with any other complaints.  Procedure note injection Right knee joint   Verbal consent was obtained to inject the right knee joint  Timeout was completed to confirm the site of injection.  The skin was prepped with alcohol and ethyl chloride was sprayed at the injection site.  A 21-gauge needle was used to inject Orthovisc hyaluronic acid into the right knee using an anterolateral approach.  There were no complications. A sterile bandage was applied.   This patient is diagnosed with osteoarthritis of the knee(s).    Radiographs show evidence of joint space narrowing, osteophytes, subchondral sclerosis and/or subchondral cysts.  This patient has knee pain which interferes with functional and activities of daily living.    This patient has experienced inadequate response, adverse effects and/or intolerance with conservative treatments such as acetaminophen , NSAIDS, topical creams, physical therapy or regular exercise, knee bracing and/or weight loss.   This patient has experienced inadequate response or has a contraindication to intra articular steroid injections for at least 3 months.   This patient is not scheduled to have a total knee replacement within 6 months of starting treatment with viscosupplementation.   Follow-up: Return if symptoms worsen or fail to improve.  Subjective:  Chief Complaint  Patient presents with   Right knee pain    HA injection #3    History of Present Illness: Adrian Kim is a 83 y.o. male who returns to clinic today for repeat evaluation of right knee pain.  Chronic pain. No recent injury. Known arthritis.  Steroid  injections have not been effective.  He is interested in HA injection.  He was seen in clinic last week for his second injection.  I saw him in clinic 2 weeks ago for his first injection.   Review of Systems: No fevers or chills No numbness or tingling No chest pain No shortness of breath No bowel or bladder dysfunction No GI distress No headaches     Objective: There were no vitals taken for this visit.  Physical Exam:  General: Alert and oriented., No acute distress., and Seated in a wheelchair. Gait: Unable to ambulate.  Evaluation of the right knee demonstrates a mild deformity.  Varus alignment overall.  Tenderness to palpation along the medial joint line.  Range of motion from 10-95 degrees.  No increased laxity varus or valgus stress.  Negative Lachman.  IMAGING: I personally ordered and reviewed the following images  X-rays of the right knee demonstrates varus alignment.  Complete loss of joint space in the medial compartment.  Advanced degenerative changes.  There are associated osteophytes.   New Medications:  No orders of the defined types were placed in this encounter.     Oneil DELENA Horde, MD  12/12/2023 11:12 AM

## 2024-01-06 ENCOUNTER — Ambulatory Visit: Admitting: Orthopedic Surgery

## 2024-02-10 ENCOUNTER — Ambulatory Visit: Admitting: Orthopedic Surgery

## 2024-02-11 NOTE — Progress Notes (Signed)
 "  Referring Physician:  Center, Carlin Blamer [facility] [address] [place],  [place] [address]  Primary Physician:  Center, Carlin Blamer [facility]  Son acts as interpreter. He speaks Greek. They did not want to use IPAD interpreter service. Paperwork signed.   History of Present Illness: [name] has a history of HTN, CAD, heart valvular disease, MI, DM, diabetic peripheral neuropathy, dementia, chronic pain, mixed hyperlipidemia.   He is a poor historian. Son helps with history.   Last seen by me on 09/16/23 for back and bilateral leg pain. 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   His primary complaint at his last visit was right knee pain. He was sent to ortho and had gel injection series in right knee that was completed on 12/12/23.   He was discharged from PT due to no show visits.   He is here for follow up.   He had improvement with right knee injections. He complains of left knee pain when walking. He also has bilateral shoulder pain with limited ROM. He has intermittent LBP that is worse with lifting his arms. He has pain in his back when he walks with his walker. This also makes his shoulders hurt more.   Primary pain today is in both shoulders.   He does not smoke.   Bowel/Bladder Dysfunction: none  Conservative measures:  Physical therapy: had initial eval on 08/19/23 and has been to 2 additional visits Multimodal medical therapy including regular antiinflammatories:  tylenol , ibuprofen  Injections:   05/07/23: Right L2-3 ESI (Dr. Tanya)  Past Surgery: no spinal surgeries   The symptoms are causing a significant impact on the patient's life.   Review of Systems:  A 10 point review of systems is negative, except for the pertinent positives and negatives  detailed in the HPI.  Past Medical History: Past Medical History:  Diagnosis Date   Arthritis    Diabetes (HCC)    Glaucoma    Heart attack (HCC)    HLD (hyperlipidemia)    HTN (hypertension)    Lower back pain    Sleep apnea    Tinnitus     Past Surgical History: Past Surgical History:  Procedure Laterality Date   CATARACT EXTRACTION W/PHACO Right 11/16/2015   Procedure: CATARACT EXTRACTION PHACO AND INTRAOCULAR LENS PLACEMENT (IOC);  Surgeon: Dene Etienne, MD;  Location: [facility];  Service: Ophthalmology;  Laterality: Right;  DIABETIC - oral meds NEEDS Greek INTERPRETER   COLONOSCOPY WITH PROPOFOL  N/A 12/10/2014   Procedure: COLONOSCOPY WITH PROPOFOL ;  Surgeon: Deward CINDERELLA Piedmont, MD;  Location: [facility];  Service: Gastroenterology;  Laterality: N/A;   CORONARY ANGIOPLASTY WITH STENT PLACEMENT      Allergies: Allergies as of 02/17/2024 - Review Complete 02/17/2024  Allergen Reaction Noted   Zolpidem  09/14/2014    Medications: Outpatient Encounter Medications as of 02/17/2024  Medication Sig   acetaminophen  (TYLENOL ) 325 MG tablet Take 650 mg by mouth every 6 (six) hours as needed.   aspirin EC 81 MG tablet Take by mouth.   cetirizine (ZYRTEC) 5 MG chewable tablet Chew 5 mg by mouth daily.   Cyanocobalamin  (VITAMIN B-12) 5000 MCG SUBL Place 1 tablet (5,000 mcg total) under the tongue daily.   dorzolamide-timolol  (COSOPT) 2-0.5 % ophthalmic solution 1 drop 2 (two) times daily.   glucose blood test  strip    ibuprofen  (ADVIL ) 400 MG tablet Take 1 tablet (400 mg total) by mouth every 6 (six) hours as needed.   INVOKANA 300 MG TABS tablet Take 300 mg by mouth daily.   latanoprost (XALATAN) 0.005 % ophthalmic solution    linagliptin (TRADJENTA) 5 MG TABS tablet Take 5 mg by mouth daily.   lisinopril (PRINIVIL,ZESTRIL) 2.5 MG tablet    metFORMIN (GLUCOPHAGE-XR) 500 MG 24 hr tablet    rosuvastatin (CRESTOR) 20 MG tablet Take 20 mg by mouth at bedtime.    RYBELSUS 7 MG TABS Take 1 tablet by mouth daily.   sitaGLIPtin (JANUVIA) 25 MG tablet Take 25 mg by mouth daily.   tamsulosin  (FLOMAX ) 0.4 MG CAPS capsule Take 0.4 mg by mouth.   timolol  (TIMOPTIC ) 0.5 % ophthalmic solution    TRUEplus Lancets 28G MISC    No facility-administered encounter medications on file as of 02/17/2024.    Social History: Social History   Tobacco Use   Smoking status: Former   Smokeless tobacco: Never   Tobacco comments:    Quit 11 years ago  Substance Use Topics   Alcohol use: No    Alcohol/week: 0.0 standard drinks of alcohol   Drug use: No    Family Medical History: Family History  Problem Relation Age of Onset   Leukemia Son    Kidney disease Neg Hx    Prostate cancer Neg Hx     Physical Examination: Vitals:   02/17/24 0812  BP: 100/60    Awake, alert, oriented to person, place, and time.  Speech is clear and fluent. Fund of knowledge is appropriate.   Cranial Nerves: Pupils equal round and reactive to light.  Facial tone is symmetric.    No abnormal lesions on exposed skin.   Strength:  Side Iliopsoas Quads Hamstring PF DF EHL  R 5 5 5 5 5 5   L 5 5 5 5 5 5    Clonus is not present.    Bilateral lower extremity sensation is intact to light touch.     Gait not tested. He is in a WC.    He has limited ROM of both shoulders, left > right, with pain. He has pain with IR/ER of left shoulder.   Medical Decision Making  Imaging: None     Assessment and Plan: [name] is a poor historian. Son helps with history.   Currently, his primary complaint is bilateral shoulder pain, left > right. He has painful limited ROM of his shoulders.   He has intermittent LBP that is worse with lifting his arms.  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   Treatment  options discussed with patient and following plan made:  - Schedule follow up with ortho Adrian Kim) for bilateral shoulder pain.  - No improvement with previous PT for lumbar spine- he does not want to revisit. He continues with HEP.  - Discussed referral to PMR at [facility] to discuss further lumbar injections. He declines for now. Has seen pain management at [facility] in the past and does not want to go back to them.  - Will plan to regroup after he sees ortho. Will call son to see how he is progressing.   I spent a total of 20 minutes in face-to-face and non-face-to-face activities related to this patient's care today including review of outside records, review  of imaging, review of symptoms, physical exam, discussion of differential diagnosis, discussion of treatment options, and documentation.   Glade Boys PA-C Dept. of Neurosurgery  "

## 2024-02-17 ENCOUNTER — Encounter: Payer: Self-pay | Admitting: Orthopedic Surgery

## 2024-02-17 ENCOUNTER — Ambulatory Visit: Admitting: Orthopedic Surgery

## 2024-02-17 VITALS — BP 100/60 | Wt 180.0 lb

## 2024-02-17 DIAGNOSIS — M25512 Pain in left shoulder: Secondary | ICD-10-CM

## 2024-02-17 DIAGNOSIS — M25511 Pain in right shoulder: Secondary | ICD-10-CM

## 2024-02-17 DIAGNOSIS — M48062 Spinal stenosis, lumbar region with neurogenic claudication: Secondary | ICD-10-CM

## 2024-02-17 DIAGNOSIS — M47816 Spondylosis without myelopathy or radiculopathy, lumbar region: Secondary | ICD-10-CM

## 2024-02-17 DIAGNOSIS — M5136 Other intervertebral disc degeneration, lumbar region with discogenic back pain only: Secondary | ICD-10-CM

## 2024-03-12 ENCOUNTER — Encounter: Admitting: Orthopedic Surgery

## 2024-03-18 ENCOUNTER — Telehealth: Payer: Self-pay | Admitting: Orthopedic Surgery

## 2024-03-18 NOTE — Telephone Encounter (Signed)
 Please call son Debby- patient had appt with ortho on 1/22 that he missed. This was for his shoulder pain. Does he need help rescheduling this?   Recommend he see ortho and then can follow up with me as needed.

## 2024-03-18 NOTE — Telephone Encounter (Signed)
 Spoke with Debby, son, they did not get a reminder about this appointment and were unaware of that appointment. I provided their number to call back to reschedule and ask them about the reminder issue as I was not sure why he did not get a reminder.  Debby said thank you for following up with them and thank you to [facility]

## 2024-04-16 ENCOUNTER — Encounter: Admitting: Orthopedic Surgery
